# Patient Record
Sex: Female | Born: 1945
Health system: Southern US, Community
[De-identification: ages and names within clinical notes are randomized; demographics above are authoritative.]

## PROBLEM LIST (undated history)

## (undated) DIAGNOSIS — I10 Essential (primary) hypertension: Secondary | ICD-10-CM

## (undated) DIAGNOSIS — M4800 Spinal stenosis, site unspecified: Secondary | ICD-10-CM

## (undated) DIAGNOSIS — M419 Scoliosis, unspecified: Secondary | ICD-10-CM

## (undated) DIAGNOSIS — I639 Cerebral infarction, unspecified: Secondary | ICD-10-CM

## (undated) HISTORY — PX: EYE SURGERY: SHX253

## (undated) HISTORY — DX: Cerebral infarction, unspecified: I63.9

## (undated) HISTORY — PX: CYST EXCISION: SHX5701

---

## 1997-11-09 ENCOUNTER — Emergency Department (HOSPITAL_COMMUNITY): Admission: EM | Admit: 1997-11-09 | Discharge: 1997-11-09 | Payer: Self-pay | Admitting: Emergency Medicine

## 1998-08-25 ENCOUNTER — Ambulatory Visit (HOSPITAL_COMMUNITY): Admission: RE | Admit: 1998-08-25 | Discharge: 1998-08-25 | Payer: Self-pay | Admitting: Cardiology

## 2000-01-04 ENCOUNTER — Encounter: Admission: RE | Admit: 2000-01-04 | Discharge: 2000-02-21 | Payer: Self-pay | Admitting: Neurological Surgery

## 2000-05-07 ENCOUNTER — Ambulatory Visit (HOSPITAL_COMMUNITY): Admission: RE | Admit: 2000-05-07 | Discharge: 2000-05-07 | Payer: Self-pay | Admitting: Neurosurgery

## 2000-05-07 ENCOUNTER — Encounter: Payer: Self-pay | Admitting: Neurosurgery

## 2000-05-21 ENCOUNTER — Ambulatory Visit (HOSPITAL_COMMUNITY): Admission: RE | Admit: 2000-05-21 | Discharge: 2000-05-21 | Payer: Self-pay | Admitting: Neurosurgery

## 2000-05-21 ENCOUNTER — Encounter: Payer: Self-pay | Admitting: Neurosurgery

## 2000-06-04 ENCOUNTER — Encounter: Payer: Self-pay | Admitting: Neurosurgery

## 2000-06-04 ENCOUNTER — Ambulatory Visit (HOSPITAL_COMMUNITY): Admission: RE | Admit: 2000-06-04 | Discharge: 2000-06-04 | Payer: Self-pay | Admitting: Neurosurgery

## 2003-02-07 ENCOUNTER — Emergency Department (HOSPITAL_COMMUNITY): Admission: EM | Admit: 2003-02-07 | Discharge: 2003-02-07 | Payer: Self-pay | Admitting: Podiatry

## 2003-05-06 ENCOUNTER — Encounter: Admission: RE | Admit: 2003-05-06 | Discharge: 2003-05-06 | Payer: Self-pay | Admitting: Neurological Surgery

## 2003-05-30 ENCOUNTER — Encounter: Admission: RE | Admit: 2003-05-30 | Discharge: 2003-05-30 | Payer: Self-pay | Admitting: Neurological Surgery

## 2003-06-14 ENCOUNTER — Encounter: Admission: RE | Admit: 2003-06-14 | Discharge: 2003-06-14 | Payer: Self-pay | Admitting: Neurological Surgery

## 2004-01-11 ENCOUNTER — Emergency Department (HOSPITAL_COMMUNITY): Admission: EM | Admit: 2004-01-11 | Discharge: 2004-01-11 | Payer: Self-pay | Admitting: Emergency Medicine

## 2004-03-27 ENCOUNTER — Emergency Department (HOSPITAL_COMMUNITY): Admission: EM | Admit: 2004-03-27 | Discharge: 2004-03-27 | Payer: Self-pay | Admitting: Emergency Medicine

## 2004-03-30 ENCOUNTER — Emergency Department (HOSPITAL_COMMUNITY): Admission: EM | Admit: 2004-03-30 | Discharge: 2004-03-30 | Payer: Self-pay | Admitting: Emergency Medicine

## 2005-03-08 ENCOUNTER — Encounter: Payer: Self-pay | Admitting: Neurological Surgery

## 2005-03-10 ENCOUNTER — Encounter: Payer: Self-pay | Admitting: Neurological Surgery

## 2005-06-22 ENCOUNTER — Ambulatory Visit: Payer: Self-pay

## 2006-03-07 ENCOUNTER — Ambulatory Visit: Payer: Self-pay

## 2006-08-27 ENCOUNTER — Ambulatory Visit (HOSPITAL_COMMUNITY): Admission: RE | Admit: 2006-08-27 | Discharge: 2006-08-27 | Payer: Self-pay | Admitting: Neurological Surgery

## 2009-09-18 ENCOUNTER — Ambulatory Visit: Payer: Self-pay | Admitting: Emergency Medicine

## 2010-07-01 ENCOUNTER — Encounter: Payer: Self-pay | Admitting: Neurological Surgery

## 2011-11-08 ENCOUNTER — Other Ambulatory Visit: Payer: Self-pay | Admitting: Cardiology

## 2012-10-21 ENCOUNTER — Encounter (HOSPITAL_COMMUNITY): Payer: Self-pay | Admitting: Emergency Medicine

## 2012-10-21 ENCOUNTER — Emergency Department (HOSPITAL_COMMUNITY)
Admission: EM | Admit: 2012-10-21 | Discharge: 2012-10-21 | Disposition: A | Discharge status: Home / Self Care | Payer: 59 | Source: Home / Self Care | Attending: Family Medicine | Admitting: Family Medicine

## 2012-10-21 ENCOUNTER — Emergency Department (INDEPENDENT_AMBULATORY_CARE_PROVIDER_SITE_OTHER): Payer: 59

## 2012-10-21 DIAGNOSIS — J029 Acute pharyngitis, unspecified: Secondary | ICD-10-CM

## 2012-10-21 HISTORY — DX: Spinal stenosis, site unspecified: M48.00

## 2012-10-21 HISTORY — DX: Scoliosis, unspecified: M41.9

## 2012-10-21 HISTORY — DX: Essential (primary) hypertension: I10

## 2012-10-21 LAB — POCT RAPID STREP A: Streptococcus, Group A Screen (Direct): NEGATIVE

## 2012-10-21 MED ORDER — AMOXICILLIN 500 MG PO CAPS: 500.0000 mg | ORAL_CAPSULE | Freq: Three times a day (TID) | ORAL | Status: DC

## 2012-10-21 NOTE — ED Notes (Signed)
Pt called x1 1430 no answer

## 2012-10-21 NOTE — ED Notes (Signed)
Asked by front desk to assess pt due to fever. States fever has been 102 F x 2 days. No Tylenol. Temp today was 98.2. Patient stable at this time. States she has chills.

## 2012-10-21 NOTE — ED Provider Notes (Addendum)
History     CSN: [id number]  Arrival date & time 10/21/12  1300   First MD Initiated Contact with Patient 10/21/12 1537      Chief Complaint  Patient presents with  . Fever    (Consider location/radiation/quality/duration/timing/severity/associated sxs/prior treatment) Patient is a 67 y.o. female presenting with fever. The history is provided by the patient.  Fever Severity:  Mild Duration:  3 days Progression:  Waxing and waning Chronicity:  New Associated symptoms: cough and rhinorrhea   Associated symptoms: no diarrhea, no dysuria, no nausea, no sore throat and no vomiting   Risk factors: sick contacts   Risk factors comment:  Grdaughter with strep throat.   Past Medical History  Diagnosis Date  . Hypertension   . Scoliosis   . Spinal stenosis     Past Surgical History  Procedure Laterality Date  . Cesarean section    . Eye surgery      No family history on file.  History  Substance Use Topics  . Smoking status: Current Every Day Smoker  . Smokeless tobacco: Not on file  . Alcohol Use: No    OB History   Grav Para Term Preterm Abortions TAB SAB Ect Mult Living                  Review of Systems  Constitutional: Positive for fever.  HENT: Positive for rhinorrhea. Negative for sore throat.   Respiratory: Positive for cough.   Gastrointestinal: Negative.  Negative for nausea, vomiting and diarrhea.  Genitourinary: Negative.  Negative for dysuria.    Allergies  Review of patient's allergies indicates no known allergies.  Home Medications   Current Outpatient Rx  Name  Route  Sig  Dispense  Refill  . metoprolol succinate (TOPROL-XL) 50 MG 24 hr tablet   Oral   Take 50 mg by mouth daily. Take with or immediately following a meal.         . amoxicillin (AMOXIL) 500 MG capsule   Oral   Take 1 capsule (500 mg total) by mouth 3 (three) times daily.   30 capsule   0     BP 126/76  Pulse 98  Temp(Src) 99.6 F (37.6 C) (Oral)  Resp 16   SpO2 95%  Physical Exam  Nursing note and vitals reviewed. Constitutional: She is oriented to person, place, and time. She appears well-developed and well-nourished.  HENT:  Head: Normocephalic.  Right Ear: External ear normal.  Left Ear: External ear normal.  Mouth/Throat: Oropharynx is clear and moist.  Eyes: Pupils are equal, round, and reactive to light.  Neck: Normal range of motion. Neck supple.  Cardiovascular: Normal rate, regular rhythm, normal heart sounds and intact distal pulses.   Pulmonary/Chest: Effort normal and breath sounds normal.  Abdominal: Soft. Bowel sounds are normal. There is no tenderness.  Neurological: She is alert and oriented to person, place, and time.  Skin: Skin is warm and dry.    ED Course  Procedures (including critical care time)  Labs Reviewed  POCT RAPID STREP A ([facility] ONLY)   Dg Chest 2 View  10/21/2012   *RADIOLOGY REPORT*  Clinical Data: Fever and cough.  Smoker.  CHEST - 2 VIEW  Comparison: None  Findings:  The heart size appears normal.  No pleural effusion or edema.  No airspace consolidation identified.  There is a scoliosis deformity involving the thoracic spine.  This is convex towards the right.  IMPRESSION:  1.  No acute findings.  2.  Thoracic spondylosis noted.   Original Report Authenticated By: Signa Kell, M.D.     1. Acute pharyngitis       MDM  X-rays reviewed and report per radiologist.         Linna Hoff, MD 10/21/12 1643  Linna Hoff, MD 10/23/12 [phone]

## 2012-10-21 NOTE — ED Notes (Signed)
Onset 3 days ago of fever ( 102), cough, stuffy head and ears and nose.  Denies sore throat

## 2012-11-29 ENCOUNTER — Emergency Department (HOSPITAL_COMMUNITY)
Admission: EM | Admit: 2012-11-29 | Discharge: 2012-11-29 | Disposition: A | Discharge status: Home / Self Care | Payer: 59 | Attending: Emergency Medicine | Admitting: Emergency Medicine

## 2012-11-29 ENCOUNTER — Emergency Department (HOSPITAL_COMMUNITY): Payer: 59

## 2012-11-29 ENCOUNTER — Other Ambulatory Visit (HOSPITAL_COMMUNITY): Payer: 59

## 2012-11-29 ENCOUNTER — Encounter (HOSPITAL_COMMUNITY): Payer: Self-pay | Admitting: Emergency Medicine

## 2012-11-29 DIAGNOSIS — R0789 Other chest pain: Secondary | ICD-10-CM | POA: Insufficient documentation

## 2012-11-29 DIAGNOSIS — M79609 Pain in unspecified limb: Secondary | ICD-10-CM | POA: Insufficient documentation

## 2012-11-29 DIAGNOSIS — G8929 Other chronic pain: Secondary | ICD-10-CM | POA: Insufficient documentation

## 2012-11-29 DIAGNOSIS — Z79899 Other long term (current) drug therapy: Secondary | ICD-10-CM | POA: Insufficient documentation

## 2012-11-29 DIAGNOSIS — Z8739 Personal history of other diseases of the musculoskeletal system and connective tissue: Secondary | ICD-10-CM | POA: Insufficient documentation

## 2012-11-29 DIAGNOSIS — M549 Dorsalgia, unspecified: Secondary | ICD-10-CM | POA: Insufficient documentation

## 2012-11-29 DIAGNOSIS — K7689 Other specified diseases of liver: Secondary | ICD-10-CM | POA: Insufficient documentation

## 2012-11-29 DIAGNOSIS — N1330 Unspecified hydronephrosis: Secondary | ICD-10-CM | POA: Insufficient documentation

## 2012-11-29 DIAGNOSIS — I10 Essential (primary) hypertension: Secondary | ICD-10-CM | POA: Insufficient documentation

## 2012-11-29 DIAGNOSIS — Z7982 Long term (current) use of aspirin: Secondary | ICD-10-CM | POA: Insufficient documentation

## 2012-11-29 DIAGNOSIS — F17200 Nicotine dependence, unspecified, uncomplicated: Secondary | ICD-10-CM | POA: Insufficient documentation

## 2012-11-29 LAB — HEPATIC FUNCTION PANEL
ALT: 43 U/L — ABNORMAL HIGH (ref 0–35)
AST: 44 U/L — ABNORMAL HIGH (ref 0–37)
Albumin: 3.7 g/dL (ref 3.5–5.2)
Alkaline Phosphatase: 176 U/L — ABNORMAL HIGH (ref 39–117)
Bilirubin, Direct: 0.1 mg/dL (ref 0.0–0.3)
Indirect Bilirubin: 0.6 mg/dL (ref 0.3–0.9)
Total Bilirubin: 0.7 mg/dL (ref 0.3–1.2)
Total Protein: 7.1 g/dL (ref 6.0–8.3)

## 2012-11-29 LAB — BASIC METABOLIC PANEL
BUN: 14 mg/dL (ref 6–23)
CO2: 28 mEq/L (ref 19–32)
Calcium: 9.7 mg/dL (ref 8.4–10.5)
Chloride: 101 mEq/L (ref 96–112)
Creatinine, Ser: 0.86 mg/dL (ref 0.50–1.10)
GFR calc Af Amer: 80 mL/min — ABNORMAL LOW (ref >90)
GFR calc non Af Amer: 69 mL/min — ABNORMAL LOW (ref >90)
Glucose, Bld: 136 mg/dL — ABNORMAL HIGH (ref 70–99)
Potassium: 4.5 mEq/L (ref 3.5–5.1)
Sodium: 138 mEq/L (ref 135–145)

## 2012-11-29 LAB — CBC
HCT: 45.5 % (ref 36.0–46.0)
Hemoglobin: 14.6 g/dL (ref 12.0–15.0)
MCH: 29.7 pg (ref 26.0–34.0)
MCHC: 32.1 g/dL (ref 30.0–36.0)
MCV: 92.7 fL (ref 78.0–100.0)
Platelets: 254 10*3/uL (ref 150–400)
RBC: 4.91 MIL/uL (ref 3.87–5.11)
RDW: 14.4 % (ref 11.5–15.5)
WBC: 16.1 10*3/uL — ABNORMAL HIGH (ref 4.0–10.5)

## 2012-11-29 LAB — PRO B NATRIURETIC PEPTIDE: Pro B Natriuretic peptide (BNP): 141.2 pg/mL — ABNORMAL HIGH (ref 0–125)

## 2012-11-29 LAB — D-DIMER, QUANTITATIVE (NOT AT ARMC): D-Dimer, Quant: 0.97 ug/mL-FEU — ABNORMAL HIGH (ref 0.00–0.48)

## 2012-11-29 LAB — URINALYSIS, ROUTINE W REFLEX MICROSCOPIC
Bilirubin Urine: NEGATIVE
Glucose, UA: NEGATIVE mg/dL
Hgb urine dipstick: NEGATIVE
Ketones, ur: NEGATIVE mg/dL
Leukocytes, UA: NEGATIVE
Nitrite: NEGATIVE
Protein, ur: NEGATIVE mg/dL
Specific Gravity, Urine: 1.011 (ref 1.005–1.030)
Urobilinogen, UA: 0.2 mg/dL (ref 0.0–1.0)
pH: 6 (ref 5.0–8.0)

## 2012-11-29 LAB — POCT I-STAT TROPONIN I: Troponin i, poc: 0 ng/mL (ref 0.00–0.08)

## 2012-11-29 LAB — LIPASE, BLOOD: Lipase: 24 U/L (ref 11–59)

## 2012-11-29 LAB — ACETAMINOPHEN LEVEL: Acetaminophen (Tylenol), Serum: <15 ug/mL (ref 10–30)

## 2012-11-29 MED ORDER — IBUPROFEN 400 MG PO TABS: 400.0000 mg | ORAL_TABLET | Freq: Four times a day (QID) | ORAL | Status: DC | PRN

## 2012-11-29 MED ORDER — HYDROCODONE-ACETAMINOPHEN 5-325 MG PO TABS: 1.0000 | ORAL_TABLET | Freq: Four times a day (QID) | ORAL | Status: DC | PRN

## 2012-11-29 MED ORDER — ACETAMINOPHEN 325 MG PO TABS
650.0000 mg | ORAL_TABLET | Freq: Once | ORAL | Status: AC
  Administered 2012-11-29: 650 mg via ORAL
  Filled 2012-11-29: qty 2

## 2012-11-29 MED ORDER — IOHEXOL 350 MG/ML SOLN
80.0000 mL | Freq: Once | INTRAVENOUS | Status: AC | PRN
  Administered 2012-11-29: 80 mL via INTRAVENOUS

## 2012-11-29 MED ORDER — SODIUM CHLORIDE 0.9 % IV SOLN
Freq: Once | INTRAVENOUS | Status: AC
  Administered 2012-11-29: 14:00:00 via INTRAVENOUS

## 2012-11-29 MED ORDER — SODIUM CHLORIDE 0.9 % IV BOLUS (SEPSIS)
1000.0000 mL | Freq: Once | INTRAVENOUS | Status: AC
  Administered 2012-11-29: 1000 mL via INTRAVENOUS

## 2012-11-29 NOTE — ED Notes (Signed)
Pt still off the floor at CT.

## 2012-11-29 NOTE — ED Notes (Signed)
Pt ambulated to room a-3 with slow, steady gait. Complains of right sided rib cage pain that radiates to side of back on right side. Also reports right leg pain but states this has been "ongoing for years due to sciatica."

## 2012-11-29 NOTE — ED Notes (Signed)
Patient transported to CT 

## 2012-11-29 NOTE — ED Notes (Signed)
Pt c/o swelling to right rib area and pain. Pt reports pain radiates to right mid back and down to hip area. Pt took Tylenol #3 this morning about 5:30 am with some relief.

## 2012-11-29 NOTE — ED Provider Notes (Signed)
History     CSN: [id number]  Arrival date & time 11/29/12  0802   First MD Initiated Contact with Patient 11/29/12 [phone]      Chief Complaint  Patient presents with  . Chest Pain    x 1 week  . Leg Pain    x 1 week  . Back Pain    x 1week    (Consider location/radiation/quality/duration/timing/severity/associated sxs/prior treatment) HPI Comments: Pt comes in with cc of chest pain. Has hx of marfans, HTN, spinal stenosis with chronic back pain. Pt states that she has chronic back pain - and has some back pain now. She has some numbness, tingling in her legs, also not new. No new weakness, urinary incontinence, urinary retention, bowel incontinence, saddle anesthesia. Pt has however new right sided chest and epigastric pain. The pain has been constant, sharp. Present for 2 days and is located in the RUQ abd, lower thorax and flank region. No hx of kidney stones, liver dz, no new cough, no uti like sx, no gall bladder dz hx. Pt denies dib. The pain is worse with deep inspiration and also with palpation.     Patient is a 67 y.o. female presenting with chest pain, leg pain, and back pain. The history is provided by the patient.  Chest Pain Associated symptoms: back pain   Associated symptoms: no abdominal pain, no cough, no headache, no nausea, no shortness of breath and not vomiting   Leg Pain Associated symptoms: back pain   Associated symptoms: no neck pain   Back Pain Associated symptoms: chest pain and leg pain   Associated symptoms: no abdominal pain, no dysuria and no headaches     Past Medical History  Diagnosis Date  . Hypertension   . Scoliosis   . Spinal stenosis     Past Surgical History  Procedure Laterality Date  . Cesarean section    . Eye surgery      No family history on file.  History  Substance Use Topics  . Smoking status: Current Every Day Smoker  . Smokeless tobacco: Not on file  . Alcohol Use: No    OB History   Grav Para Term Preterm  Abortions TAB SAB Ect Mult Living                  Review of Systems  Constitutional: Positive for activity change.  HENT: Negative for facial swelling and neck pain.   Respiratory: Negative for cough, shortness of breath and wheezing.   Cardiovascular: Positive for chest pain.  Gastrointestinal: Negative for nausea, vomiting, abdominal pain, diarrhea, constipation, blood in stool and abdominal distention.  Genitourinary: Negative for dysuria, hematuria and difficulty urinating.  Musculoskeletal: Positive for back pain.  Skin: Negative for color change.  Neurological: Negative for speech difficulty and headaches.  Hematological: Does not bruise/bleed easily.  Psychiatric/Behavioral: Negative for confusion.    Allergies  Review of patient's allergies indicates no known allergies.  Home Medications   Current Outpatient Rx  Name  Route  Sig  Dispense  Refill  . acetaminophen (TYLENOL) 500 MG tablet   Oral   Take 1,000 mg by mouth every 6 (six) hours as needed for pain.         Marland Kitchen acetaminophen-codeine (TYLENOL #3) 300-30 MG per tablet   Oral   Take 1 tablet by mouth every 4 (four) hours as needed for pain.         Marland Kitchen aspirin 81 MG tablet   Oral  Take 81 mg by mouth daily.         Marland Kitchen gabapentin (NEURONTIN) 300 MG capsule   Oral   Take 300 mg by mouth daily.         . metoprolol succinate (TOPROL-XL) 50 MG 24 hr tablet   Oral   Take 50 mg by mouth daily. Take with or immediately following a meal.           BP 118/72  Pulse 93  Temp(Src) 98 F (36.7 C) (Oral)  Resp 20  SpO2 96%  Physical Exam  Nursing note and vitals reviewed. Constitutional: She is oriented to person, place, and time. She appears well-developed and well-nourished.  HENT:  Head: Normocephalic and atraumatic.  Eyes: EOM are normal. Pupils are equal, round, and reactive to light.  Neck: Neck supple. No JVD present.  Cardiovascular: Normal rate, regular rhythm and normal heart sounds.    No murmur heard. Pulmonary/Chest: Effort normal. No respiratory distress. She exhibits tenderness.  Right anterior rib tenderness.   Abdominal: Soft. She exhibits no distension. There is tenderness. There is no rebound and no guarding.  RUQ tenderness, hepatomegaly, + right sided CVA tenderness/  Musculoskeletal: She exhibits edema.  Trace edema, with LLE unilateral swelling  Neurological: She is alert and oriented to person, place, and time.  Skin: Skin is warm and dry.    ED Course  Procedures (including critical care time)  Labs Reviewed  CBC - Abnormal; Notable for the following:    WBC 16.1 (*)    All other components within normal limits  BASIC METABOLIC PANEL  HEPATIC FUNCTION PANEL  LIPASE, BLOOD  URINALYSIS, ROUTINE W REFLEX MICROSCOPIC  ACETAMINOPHEN LEVEL  D-DIMER, QUANTITATIVE  PRO B NATRIURETIC PEPTIDE  POCT I-STAT TROPONIN I   No results found.   No diagnosis found.    MDM   Date: 11/29/2012  Rate: 88  Rhythm: normal sinus rhythm  QRS Axis: right  Intervals: normal  ST/T Wave abnormalities: nonspecific ST/T changes  Conduction Disutrbances:right bundle branch block and left posterior fascicular block  Narrative Interpretation:   Old EKG Reviewed: none available  Differential diagnosis includes: Valvular disorder Myocarditis Pericarditis Pneumonia PE Musculoskeletal pain Pancreatitis Hepatobiliary pathology including cholecystitis Gastritis/PUD SBO ACS syndrome Aortic Dissection  Pt comes in with cc of right sided abd pain, flank pain. The back pain, leg cramps are not new. Pt's exam shows some hepatomegaly, and pleuritic and musculoskeletal chest pain of the ribs, no rash / zoster.  Hx of marfans, so dissection is on the ddx. Hepatomegaly - unsure etiology. Pt has chronic tylenol use.  PE is also on the ddx, especially with the ekg showing right sided strain patter. It is possible that patient has hepatic congestion from right sided  CHF. She has hx of MVP, so we will get CHF labs as well.   2:53 PM Imaging results are back. Large hepatic cyst - will get surgery to see her. Elevated WC - not sure what the cause is - ua, cxr, ct, [place] - no infections. Will discharge.  Derwood Kaplan, MD 11/29/12 1455

## 2012-12-02 ENCOUNTER — Encounter (HOSPITAL_COMMUNITY): Payer: Self-pay | Admitting: Family Medicine

## 2012-12-02 ENCOUNTER — Inpatient Hospital Stay (HOSPITAL_COMMUNITY)
Admission: EM | Admit: 2012-12-02 | Discharge: 2012-12-06 | DRG: 418 | Disposition: A | Discharge status: Home / Self Care | Payer: 59 | Attending: Family Medicine | Admitting: Family Medicine

## 2012-12-02 DIAGNOSIS — R160 Hepatomegaly, not elsewhere classified: Secondary | ICD-10-CM

## 2012-12-02 DIAGNOSIS — D72829 Elevated white blood cell count, unspecified: Secondary | ICD-10-CM | POA: Diagnosis present

## 2012-12-02 DIAGNOSIS — K819 Cholecystitis, unspecified: Secondary | ICD-10-CM | POA: Diagnosis present

## 2012-12-02 DIAGNOSIS — K7689 Other specified diseases of liver: Principal | ICD-10-CM | POA: Diagnosis present

## 2012-12-02 DIAGNOSIS — M4120 Other idiopathic scoliosis, site unspecified: Secondary | ICD-10-CM | POA: Diagnosis present

## 2012-12-02 DIAGNOSIS — R7309 Other abnormal glucose: Secondary | ICD-10-CM | POA: Diagnosis present

## 2012-12-02 DIAGNOSIS — R6510 Systemic inflammatory response syndrome (SIRS) of non-infectious origin without acute organ dysfunction: Secondary | ICD-10-CM

## 2012-12-02 DIAGNOSIS — I10 Essential (primary) hypertension: Secondary | ICD-10-CM | POA: Diagnosis present

## 2012-12-02 DIAGNOSIS — F17200 Nicotine dependence, unspecified, uncomplicated: Secondary | ICD-10-CM | POA: Diagnosis present

## 2012-12-02 DIAGNOSIS — R109 Unspecified abdominal pain: Secondary | ICD-10-CM | POA: Diagnosis present

## 2012-12-02 DIAGNOSIS — Q8740 Marfan's syndrome, unspecified: Secondary | ICD-10-CM

## 2012-12-02 DIAGNOSIS — G8929 Other chronic pain: Secondary | ICD-10-CM | POA: Diagnosis present

## 2012-12-02 LAB — CBC WITH DIFFERENTIAL/PLATELET
Basophils Absolute: 0 10*3/uL (ref 0.0–0.1)
Basophils Relative: 0 % (ref 0–1)
Eosinophils Absolute: 0.3 10*3/uL (ref 0.0–0.7)
Eosinophils Relative: 2 % (ref 0–5)
HCT: 45.5 % (ref 36.0–46.0)
Hemoglobin: 15.2 g/dL — ABNORMAL HIGH (ref 12.0–15.0)
Lymphocytes Relative: 21 % (ref 12–46)
Lymphs Abs: 3.1 10*3/uL (ref 0.7–4.0)
MCH: 30.1 pg (ref 26.0–34.0)
MCHC: 33.4 g/dL (ref 30.0–36.0)
MCV: 90.1 fL (ref 78.0–100.0)
Monocytes Absolute: 1 10*3/uL (ref 0.1–1.0)
Monocytes Relative: 7 % (ref 3–12)
Neutro Abs: 10.3 10*3/uL — ABNORMAL HIGH (ref 1.7–7.7)
Neutrophils Relative %: 70 % (ref 43–77)
Platelets: UNDETERMINED 10*3/uL (ref 150–400)
RBC: 5.05 MIL/uL (ref 3.87–5.11)
RDW: 14.4 % (ref 11.5–15.5)
WBC: 14.7 10*3/uL — ABNORMAL HIGH (ref 4.0–10.5)

## 2012-12-02 LAB — URINALYSIS, ROUTINE W REFLEX MICROSCOPIC
Glucose, UA: NEGATIVE mg/dL
Hgb urine dipstick: NEGATIVE
Ketones, ur: 15 mg/dL — AB
Leukocytes, UA: NEGATIVE
Nitrite: NEGATIVE
Protein, ur: 30 mg/dL — AB
Specific Gravity, Urine: 1.029 (ref 1.005–1.030)
Urobilinogen, UA: 1 mg/dL (ref 0.0–1.0)
pH: 5 (ref 5.0–8.0)

## 2012-12-02 LAB — COMPREHENSIVE METABOLIC PANEL
ALT: 27 U/L (ref 0–35)
AST: 23 U/L (ref 0–37)
Albumin: 3.6 g/dL (ref 3.5–5.2)
Alkaline Phosphatase: 169 U/L — ABNORMAL HIGH (ref 39–117)
BUN: 13 mg/dL (ref 6–23)
CO2: 23 mEq/L (ref 19–32)
Calcium: 10.1 mg/dL (ref 8.4–10.5)
Chloride: 99 mEq/L (ref 96–112)
Creatinine, Ser: 0.99 mg/dL (ref 0.50–1.10)
GFR calc Af Amer: 67 mL/min — ABNORMAL LOW (ref >90)
GFR calc non Af Amer: 58 mL/min — ABNORMAL LOW (ref >90)
Glucose, Bld: 132 mg/dL — ABNORMAL HIGH (ref 70–99)
Potassium: 4.4 mEq/L (ref 3.5–5.1)
Sodium: 138 mEq/L (ref 135–145)
Total Bilirubin: 0.5 mg/dL (ref 0.3–1.2)
Total Protein: 7.5 g/dL (ref 6.0–8.3)

## 2012-12-02 LAB — LIPASE, BLOOD: Lipase: 19 U/L (ref 11–59)

## 2012-12-02 LAB — URINE MICROSCOPIC-ADD ON

## 2012-12-02 LAB — POCT I-STAT TROPONIN I: Troponin i, poc: 0.01 ng/mL (ref 0.00–0.08)

## 2012-12-02 MED ORDER — ONDANSETRON 4 MG PO TBDP
4.0000 mg | ORAL_TABLET | Freq: Once | ORAL | Status: AC
  Administered 2012-12-02: 4 mg via ORAL
  Filled 2012-12-02: qty 1

## 2012-12-02 MED ORDER — MORPHINE SULFATE 2 MG/ML IJ SOLN
2.0000 mg | Freq: Once | INTRAMUSCULAR | Status: AC
  Administered 2012-12-02: 2 mg via INTRAVENOUS
  Filled 2012-12-02: qty 1

## 2012-12-02 MED ORDER — OXYCODONE-ACETAMINOPHEN 5-325 MG PO TABS
1.0000 | ORAL_TABLET | Freq: Once | ORAL | Status: AC
  Administered 2012-12-02: 1 via ORAL
  Filled 2012-12-02: qty 1

## 2012-12-02 MED ORDER — SODIUM CHLORIDE 0.9 % IV BOLUS (SEPSIS)
1000.0000 mL | Freq: Once | INTRAVENOUS | Status: AC
  Administered 2012-12-02: 1000 mL via INTRAVENOUS

## 2012-12-02 MED ORDER — ONDANSETRON HCL 4 MG/2ML IJ SOLN
4.0000 mg | Freq: Once | INTRAMUSCULAR | Status: AC
  Administered 2012-12-02: 4 mg via INTRAVENOUS
  Filled 2012-12-02: qty 2

## 2012-12-02 NOTE — ED Notes (Signed)
Pt aware of need for urine sample. Reports she is unable to go at this time.

## 2012-12-02 NOTE — ED Provider Notes (Signed)
History    CSN: [id number] Arrival date & time 12/02/12  1718  First MD Initiated Contact with Patient 12/02/12 2325     Chief Complaint  Patient presents with  . 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  She presents for the second time in the past 4 days with complaints of right-sided abdominal pain. Her pain began about 5 days ago. It has been persistently worsening. She denies fever, nausea, vomiting and diarrhea. Her pain is worse when she lays on her right side and feels least severe when she is lying in the left lateral decubitus position. The pain as aching, pressure like and 8/10 in severity. She denies any genitourinary symptoms.  The patient was evaluated for the same complaint on June 22 and this emergency department. That time she had CT of the abdomen and pelvis which showed a giant cystic liver lesion which encompassed nearly the entire right lobe of the liver and extended to the level of the iliac crest. Followup was arranged with Gen. surgery and the patient was discharged.  She has been taking hydrocodone which was prescribed to her upon discharge. However, she feels her pain is worsening. The patient has not been able to sleep at night due to the severity of her pain.  Past Medical History  Diagnosis Date  . Hypertension   . Scoliosis   . Spinal stenosis    Past Surgical History  Procedure Laterality Date  . Cesarean section    . Eye surgery     History reviewed. No pertinent family history. History  Substance Use Topics  . Smoking status: Current Every Day Smoker  . Smokeless tobacco: Not on file  . Alcohol Use: No   OB History   Grav Para Term Preterm Abortions TAB SAB Ect Mult Living                 Review of Systems Gen: no weight loss,  fevers, chills, night sweats Eyes: no discharge or drainage, no occular pain or visual changes Nose: no epistaxis or rhinorrhea Mouth: no dental pain, no sore throat Neck: no neck pain Lungs: no SOB, cough, wheezing CV: no chest pain, palpitations, dependent edema or orthopnea Abd: As per history of present illness, otherwise negative GU: no dysuria or gross hematuria MSK: no myalgias or arthralgias Neuro: no headache, no focal neurologic deficits Endocrine-negative Skin: no rash Psyche: negative.  Allergies  Other  Home Medications   Current Outpatient Rx  Name  Route  Sig  Dispense  Refill  . acetaminophen (TYLENOL) 500 MG tablet   Oral   Take 1,000 mg by mouth every 6 (six) hours as needed for pain.         Marland Kitchen aspirin 81 MG tablet   Oral   Take 81 mg by mouth daily.         Marland Kitchen gabapentin (NEURONTIN) 300 MG capsule   Oral   Take 300 mg by mouth at bedtime.          Marland Kitchen HYDROcodone-acetaminophen (NORCO/VICODIN) 5-325 MG per tablet   Oral   Take 1 tablet by mouth every 6 (six) hours as needed for pain.         Marland Kitchen ibuprofen (ADVIL,MOTRIN) 400 MG tablet   Oral   Take 400 mg by mouth every 6 (six) hours as needed for pain.         Marland Kitchen  metoprolol succinate (TOPROL-XL) 50 MG 24 hr tablet   Oral   Take 50 mg by mouth 2 (two) times daily. Take with or immediately following a meal.         . Multiple Vitamins-Minerals (MULTIVITAMIN PO)   Oral   Take 1 tablet by mouth daily.          BP 129/72  Pulse 92  Temp(Src) 98.4 F (36.9 C)  Resp 18  SpO2 94% Physical Exam Gen: well developed and well nourished appearing Head: NCAT Eyes: PERL, EOMI Nose: no epistaixis or rhinorrhea Mouth/throat: mucosa is moist and pink Neck: supple, no stridor Lungs: CTA B, no wheezing, rhonchi or rales CV: RRR, no murmur Abd:+ hepatomegaly with liver palpable to the LUQ and into the pelvis, there is ttp over the right flank region, BS present, abd is soft and nondistended.   Back: no ttp, no cva ttp Skin: patient appears to have scattered petechiae over the truck and extremities. However, when I pointed this finding out to her she said that she has always had these skin lesions and that they are unchanged. Neuro: CN ii-xii grossly intact, no focal deficits Psyche; normal affect,  calm and cooperative.   ED Course  Procedures (including critical care time) Labs Reviewed  CBC WITH DIFFERENTIAL - Abnormal; Notable for the following:    WBC 14.7 (*)    Hemoglobin 15.2 (*)    Neutro Abs 10.3 (*)    All other components within normal limits  COMPREHENSIVE METABOLIC PANEL - Abnormal; Notable for the following:    Glucose, Bld 132 (*)    Alkaline Phosphatase 169 (*)    GFR calc non Af Amer 58 (*)    GFR calc Af Amer 67 (*)    All other components within normal limits  URINALYSIS, ROUTINE W REFLEX MICROSCOPIC - Abnormal; Notable for the following:    Color, Urine STRAW (*)    Bilirubin Urine MODERATE (*)    Ketones, ur 15 (*)    Protein, ur 30 (*)    All other components within normal limits  URINE MICROSCOPIC-ADD ON - Abnormal; Notable for the following:    Casts HYALINE CASTS (*)    All other components within normal limits  LIPASE, BLOOD  POCT I-STAT TROPONIN I   CT scan from November 29 2012 reviewed  And results as noted in HPI.   MDM  0030 - Case discussed with Dr. Carolynne Edouard, [facility]. He has reviewed the patient's chart and states that this mass has been present and stable for 8 years. Thus, he does not believe that the patient is an appropriate surgical admission. He will arrange follow up with Dr. Donell Beers.   0100 - Case discussed with Dr. Chestine Spore of Radiology. The CT scan in 2008 was a CT myelogram and did not evaluate the liver. Per his report after reviewing the study, the patient's giant cystic liver lesion was not seen on 2008 CT.   0130 - Case discussed with Dr. Toniann Fail who will see and admit the patient. I have written temporary admission orders.    Brandt Loosen, MD 12/03/12 [phone]

## 2012-12-02 NOTE — ED Notes (Signed)
Per pt was seen here recently for same. RLQ pain radiating into RUQ and into her back . Was referred to a GI doctor and tols she needed to see a Careers adviser. sts appt on the 2nd. sts pain is too bad and nausea. sts pain meds not working.

## 2012-12-02 NOTE — ED Notes (Addendum)
Pt c/o lower back pain and RUQ pain since last Monday. Pt was here on Sunday for same issues. Pt was referred to [facility], but they told her it wasn't an appropriate reference, gave her a more appropriate dr. Rock Nephew has an appoint with sx on Wednesday but sts the pain is just so bad she can't wait until then.sts she is nauseous here and there, denies v/d. Pt in nad, skin warm and dry, resp e/u.

## 2012-12-03 ENCOUNTER — Encounter (HOSPITAL_COMMUNITY): Payer: Self-pay | Admitting: Internal Medicine

## 2012-12-03 DIAGNOSIS — R109 Unspecified abdominal pain: Secondary | ICD-10-CM

## 2012-12-03 DIAGNOSIS — K7689 Other specified diseases of liver: Secondary | ICD-10-CM

## 2012-12-03 DIAGNOSIS — Q8740 Marfan's syndrome, unspecified: Secondary | ICD-10-CM

## 2012-12-03 DIAGNOSIS — I10 Essential (primary) hypertension: Secondary | ICD-10-CM

## 2012-12-03 HISTORY — DX: Other specified diseases of liver: K76.89

## 2012-12-03 LAB — BASIC METABOLIC PANEL
BUN: 15 mg/dL (ref 6–23)
CO2: 26 mEq/L (ref 19–32)
Calcium: 9.4 mg/dL (ref 8.4–10.5)
Chloride: 105 mEq/L (ref 96–112)
Creatinine, Ser: 0.87 mg/dL (ref 0.50–1.10)
GFR calc Af Amer: 79 mL/min — ABNORMAL LOW (ref >90)
GFR calc non Af Amer: 68 mL/min — ABNORMAL LOW (ref >90)
Glucose, Bld: 101 mg/dL — ABNORMAL HIGH (ref 70–99)
Potassium: 4.4 mEq/L (ref 3.5–5.1)
Sodium: 139 mEq/L (ref 135–145)

## 2012-12-03 LAB — HEMOGLOBIN A1C
Hgb A1c MFr Bld: 5.8 % — ABNORMAL HIGH (ref <5.7)
Mean Plasma Glucose: 120 mg/dL — ABNORMAL HIGH (ref <117)

## 2012-12-03 LAB — CBC
HCT: 41.3 % (ref 36.0–46.0)
Hemoglobin: 13.2 g/dL (ref 12.0–15.0)
MCH: 29.5 pg (ref 26.0–34.0)
MCHC: 32 g/dL (ref 30.0–36.0)
MCV: 92.2 fL (ref 78.0–100.0)
Platelets: 266 10*3/uL (ref 150–400)
RBC: 4.48 MIL/uL (ref 3.87–5.11)
RDW: 14.5 % (ref 11.5–15.5)
WBC: 10.8 10*3/uL — ABNORMAL HIGH (ref 4.0–10.5)

## 2012-12-03 LAB — PROTIME-INR
INR: 1.02 (ref 0.00–1.49)
Prothrombin Time: 13.2 seconds (ref 11.6–15.2)

## 2012-12-03 LAB — APTT: aPTT: 35 seconds (ref 24–37)

## 2012-12-03 MED ORDER — ACETAMINOPHEN 650 MG RE SUPP: 650.0000 mg | Freq: Four times a day (QID) | RECTAL | Status: DC | PRN

## 2012-12-03 MED ORDER — ONDANSETRON HCL 4 MG/2ML IJ SOLN
4.0000 mg | Freq: Four times a day (QID) | INTRAMUSCULAR | Status: DC | PRN
  Administered 2012-12-03: 4 mg via INTRAVENOUS
  Filled 2012-12-03: qty 2

## 2012-12-03 MED ORDER — ACETAMINOPHEN 325 MG PO TABS
650.0000 mg | ORAL_TABLET | Freq: Four times a day (QID) | ORAL | Status: DC | PRN
  Administered 2012-12-05 – 2012-12-06 (×2): 650 mg via ORAL
  Filled 2012-12-03 (×2): qty 2

## 2012-12-03 MED ORDER — HYDROCODONE-ACETAMINOPHEN 5-325 MG PO TABS
1.0000 | ORAL_TABLET | Freq: Four times a day (QID) | ORAL | Status: DC | PRN
  Administered 2012-12-03 – 2012-12-05 (×6): 1 via ORAL
  Filled 2012-12-03 (×6): qty 1

## 2012-12-03 MED ORDER — GABAPENTIN 300 MG PO CAPS
300.0000 mg | ORAL_CAPSULE | Freq: Every day | ORAL | Status: DC
  Administered 2012-12-03 – 2012-12-05 (×3): 300 mg via ORAL
  Filled 2012-12-03 (×5): qty 1

## 2012-12-03 MED ORDER — SODIUM CHLORIDE 0.9 % IV SOLN
INTRAVENOUS | Status: DC
  Administered 2012-12-03: 06:00:00 via INTRAVENOUS

## 2012-12-03 MED ORDER — MORPHINE SULFATE 2 MG/ML IJ SOLN
2.0000 mg | Freq: Once | INTRAMUSCULAR | Status: AC
  Administered 2012-12-03: 2 mg via INTRAVENOUS
  Filled 2012-12-03: qty 1

## 2012-12-03 MED ORDER — ONDANSETRON HCL 4 MG PO TABS
4.0000 mg | ORAL_TABLET | Freq: Four times a day (QID) | ORAL | Status: DC | PRN
  Administered 2012-12-03 – 2012-12-04 (×4): 4 mg via ORAL
  Filled 2012-12-03 (×4): qty 1

## 2012-12-03 MED ORDER — DEXTROSE 5 % IV SOLN
1.0000 g | Freq: Once | INTRAVENOUS | Status: AC
  Administered 2012-12-03: 1 g via INTRAVENOUS
  Filled 2012-12-03: qty 10

## 2012-12-03 MED ORDER — METOPROLOL SUCCINATE ER 50 MG PO TB24
50.0000 mg | ORAL_TABLET | Freq: Two times a day (BID) | ORAL | Status: DC
  Administered 2012-12-03 – 2012-12-06 (×7): 50 mg via ORAL
  Filled 2012-12-03 (×10): qty 1

## 2012-12-03 MED ORDER — MORPHINE SULFATE 2 MG/ML IJ SOLN: 1.0000 mg | INTRAMUSCULAR | Status: DC | PRN

## 2012-12-03 NOTE — Consult Note (Signed)
Patient to be seen by Dr. Donell Beers at [facility].  Marta Lamas. Gae Bon, MD, FACS [phone] [id number] [facility]

## 2012-12-03 NOTE — Consult Note (Signed)
Reason for Consult: Abdominal pain, Large Hepatic cyst Referring Physician: Adeline C Viyuoh, MD  Leslie Dean is an 66 y.o. female.  HPI: 66 y.o. female with known history of Marfan's syndrome, hypertension has been experiencing right-sided abdominal pain for last week which has been gradually worsened. Patient came to the ER 3 days ago and had CT angiogram of the chest and abdomen pelvis which showed a large hepatic cyst and was referred to gastroenterologist. Patient's pain continued to worsen and presented to the ER again. At that time the patient was admitted for pain management and surgical consultation. Patient denies any vomiting but has some mild nausea.  We are asked to see the patient for further surgical evaluation.   Past Medical History  Diagnosis Date  . Hypertension   . Scoliosis   . Spinal stenosis     Past Surgical History  Procedure Laterality Date  . Cesarean section    . Eye surgery      Family History  Problem Relation Age of Onset  . Marfan syndrome Grandchild     Social History:  reports that she has been smoking.  She does not have any smokeless tobacco history on file. She reports that she does not drink alcohol or use illicit drugs.  Allergies:  Allergies  Allergen Reactions  . Other     Says narcotics cause nausea for her    Medications: I have reviewed the patient's current medications.  Results for orders placed during the hospital encounter of 12/02/12 (from the past 48 hour(s))  CBC WITH DIFFERENTIAL     Status: Abnormal   Collection Time    12/02/12  5:35 PM      Result Value Range   WBC 14.7 (*) 4.0 - 10.5 K/uL   Comment: Chaunta Bejarano COUNT CONFIRMED ON SMEAR   RBC 5.05  3.87 - 5.11 MIL/uL   Hemoglobin 15.2 (*) 12.0 - 15.0 g/dL   HCT 45.5  36.0 - 46.0 %   MCV 90.1  78.0 - 100.0 fL   MCH 30.1  26.0 - 34.0 pg   MCHC 33.4  30.0 - 36.0 g/dL   RDW 14.4  11.5 - 15.5 %   Platelets PLATELET CLUMPS NOTED ON SMEAR, UNABLE TO ESTIMATE  150 - 400 K/uL    Comment: PLATELET CLUMPING, SUGGEST RECOLLECTION OF SAMPLE IN CITRATE TUBE.   Neutrophils Relative % 70  43 - 77 %   Lymphocytes Relative 21  12 - 46 %   Monocytes Relative 7  3 - 12 %   Eosinophils Relative 2  0 - 5 %   Basophils Relative 0  0 - 1 %   Neutro Abs 10.3 (*) 1.7 - 7.7 K/uL   Lymphs Abs 3.1  0.7 - 4.0 K/uL   Monocytes Absolute 1.0  0.1 - 1.0 K/uL   Eosinophils Absolute 0.3  0.0 - 0.7 K/uL   Basophils Absolute 0.0  0.0 - 0.1 K/uL   Smear Review PLATELET CLUMPS NOTED ON SMEAR    COMPREHENSIVE METABOLIC PANEL     Status: Abnormal   Collection Time    12/02/12  5:35 PM      Result Value Range   Sodium 138  135 - 145 mEq/L   Potassium 4.4  3.5 - 5.1 mEq/L   Chloride 99  96 - 112 mEq/L   CO2 23  19 - 32 mEq/L   Glucose, Bld 132 (*) 70 - 99 mg/dL   BUN 13  6 - 23 mg/dL     Creatinine, Ser 0.99  0.50 - 1.10 mg/dL   Calcium 10.1  8.4 - 10.5 mg/dL   Total Protein 7.5  6.0 - 8.3 g/dL   Albumin 3.6  3.5 - 5.2 g/dL   AST 23  0 - 37 U/L   ALT 27  0 - 35 U/L   Alkaline Phosphatase 169 (*) 39 - 117 U/L   Total Bilirubin 0.5  0.3 - 1.2 mg/dL   GFR calc non Af Amer 58 (*) >90 mL/min   GFR calc Af Amer 67 (*) >90 mL/min   Comment:            The eGFR has been calculated     using the CKD EPI equation.     This calculation has not been     validated in all clinical     situations.     eGFR's persistently     <90 mL/min signify     possible Chronic Kidney Disease.  LIPASE, BLOOD     Status: None   Collection Time    12/02/12  5:35 PM      Result Value Range   Lipase 19  11 - 59 U/L  POCT I-STAT TROPONIN I     Status: None   Collection Time    12/02/12  5:50 PM      Result Value Range   Troponin i, poc 0.01  0.00 - 0.08 ng/mL   Comment 3            Comment: Due to the release kinetics of cTnI,     a negative result within the first hours     of the onset of symptoms does not rule out     myocardial infarction with certainty.     If myocardial infarction is still  suspected,     repeat the test at appropriate intervals.  URINALYSIS, ROUTINE W REFLEX MICROSCOPIC     Status: Abnormal   Collection Time    12/02/12 11:04 PM      Result Value Range   Color, Urine STRAW (*) YELLOW   APPearance CLEAR  CLEAR   Specific Gravity, Urine 1.029  1.005 - 1.030   pH 5.0  5.0 - 8.0   Glucose, UA NEGATIVE  NEGATIVE mg/dL   Hgb urine dipstick NEGATIVE  NEGATIVE   Bilirubin Urine MODERATE (*) NEGATIVE   Ketones, ur 15 (*) NEGATIVE mg/dL   Protein, ur 30 (*) NEGATIVE mg/dL   Urobilinogen, UA 1.0  0.0 - 1.0 mg/dL   Nitrite NEGATIVE  NEGATIVE   Leukocytes, UA NEGATIVE  NEGATIVE  URINE MICROSCOPIC-ADD ON     Status: Abnormal   Collection Time    12/02/12 11:04 PM      Result Value Range   Squamous Epithelial / LPF RARE  RARE   WBC, UA 0-2  <3 WBC/hpf   RBC / HPF 0-2  <3 RBC/hpf   Bacteria, UA RARE  RARE   Casts HYALINE CASTS (*) NEGATIVE  PROTIME-INR     Status: None   Collection Time    12/02/12 11:46 PM      Result Value Range   Prothrombin Time 13.2  11.6 - 15.2 seconds   INR 1.02  0.00 - 1.49  APTT     Status: None   Collection Time    12/02/12 11:46 PM      Result Value Range   aPTT 35  24 - 37 seconds  BASIC METABOLIC PANEL     Status: Abnormal     Collection Time    12/03/12  7:10 AM      Result Value Range   Sodium 139  135 - 145 mEq/L   Potassium 4.4  3.5 - 5.1 mEq/L   Chloride 105  96 - 112 mEq/L   CO2 26  19 - 32 mEq/L   Glucose, Bld 101 (*) 70 - 99 mg/dL   BUN 15  6 - 23 mg/dL   Creatinine, Ser 0.87  0.50 - 1.10 mg/dL   Calcium 9.4  8.4 - 10.5 mg/dL   GFR calc non Af Amer 68 (*) >90 mL/min   GFR calc Af Amer 79 (*) >90 mL/min   Comment:            The eGFR has been calculated     using the CKD EPI equation.     This calculation has not been     validated in all clinical     situations.     eGFR's persistently     <90 mL/min signify     possible Chronic Kidney Disease.  CBC     Status: Abnormal   Collection Time    12/03/12   7:10 AM      Result Value Range   WBC 10.8 (*) 4.0 - 10.5 K/uL   RBC 4.48  3.87 - 5.11 MIL/uL   Hemoglobin 13.2  12.0 - 15.0 g/dL   HCT 41.3  36.0 - 46.0 %   MCV 92.2  78.0 - 100.0 fL   MCH 29.5  26.0 - 34.0 pg   MCHC 32.0  30.0 - 36.0 g/dL   RDW 14.5  11.5 - 15.5 %   Platelets 266  150 - 400 K/uL    No results found.  Review of Systems  Gastrointestinal: Positive for nausea and abdominal pain. Negative for vomiting, diarrhea, constipation and blood in stool.  All other systems reviewed and are negative.   Blood pressure 114/67, pulse 88, temperature 97.8 F (36.6 C), temperature source Oral, resp. rate 18, height 5' 7" (1.702 m), weight 208 lb 12.4 oz (94.7 kg), SpO2 96.00%. Physical Exam  Constitutional: She is oriented to person, place, and time. She appears well-developed and well-nourished. No distress.  HENT:  Head: Normocephalic and atraumatic.  Eyes: Conjunctivae are normal. Pupils are equal, round, and reactive to light.  Neck: Normal range of motion. Neck supple.  Cardiovascular: Normal rate and regular rhythm.   Respiratory: Effort normal and breath sounds normal.  GI: Bowel sounds are normal. She exhibits mass. There is tenderness.  Tender esp along right flank and around to back, large palp mass along the right upper abdominal, tender, +bs  Genitourinary:  deferred  Musculoskeletal: Normal range of motion. She exhibits no edema.  Neurological: She is alert and oriented to person, place, and time.  Skin: Skin is warm and dry.  Psychiatric: She has a normal mood and affect. Her behavior is normal. Thought content normal.    Assessment/Plan: Abdominal pain, Large hepatic cyst: our surgeon who specializes in the type of procedures that would most likely be appropriate for the patient is Dr. Byerly who is currently at [facility].  The patient will be transferred there for further evaluation and possible surgical intervention.  Dr. Byerly will see the patient  after she arrives at [facility], the attending team here has been notified and will arrange transfer.  Leslie Dean 12/03/2012, 12:22 PM      

## 2012-12-03 NOTE — Progress Notes (Signed)
Received report from RN at [facility], Pt arrived this unit via care link. She is alert and oriented, able to verbalize needs. VSS, Will continue to monitor.

## 2012-12-03 NOTE — Progress Notes (Signed)
[facility] PROGRESS NOTE  Leslie Dean ZOX:[id number] DOB: [birth year] DOA: 12/02/2012 PCP: No PCP Per Patient I have seen and examined pt who is a  66yo admitted this am by Dr  Toniann Fail with h/o Marfan's syndrome, HTN presenting with worsening R. Sided abd pain x1 and found to have Multiple hepatic cysts identified in the upper liver including a giant right lobe cyst measuring at least 21 cm in diameter per  CT angio of chest and CT abd with Extremely large cyst extending from the right hepatic lobe and occupying the near entirety of the right abdomen and no renal obstruction. Pt still with c/o pain this am,but ok control with meds, she denies any N/V, reviewed all studies with pt, noting wbc is now down to 10.8 on no abx.Will continue current management plan as per Dr Toniann Fail and follow. I Consulted Dr Donell Beers this am and PA states she is at [facility] this week and requests pt be  transferred to [facility] ASAP so that she can evaluate and possibly schedule for surgery as appropriate. I have discussed this plan with pt and she voices understanding and is agreeable. She will be transferred to [facility] 2.        Kela Millin  [facility] Pager [phone]. If 7PM-7AM, please contact night-coverage at [email or web address], password [facility] 12/03/2012, 8:42 AM  LOS: 1 day

## 2012-12-03 NOTE — Progress Notes (Signed)
Pt transferred to unit via stretcher from ED. Pt is alert and oriented x 4. Capable of voicing needs. Oriented to staff, room and call bell. Bed in lowest position. Safety contract reviewed. Dondra Spry

## 2012-12-03 NOTE — H&P (Signed)
[facility] History and Physical  Leslie Dean ZOX:[id number] DOB: [birth year] DOA: 12/02/2012  Referring physician: ER physician. PCP: No PCP Per Patient   Chief Complaint: Abdominal pain.  HPI: Leslie Dean is a 67 y.o. female with known history of Marfan's syndrome, hypertension has been experiencing right-sided abdominal pain for last one week which has been gradually worsening. Patient did complete ER 2 days ago and had CT angiogram of the chest and abdomen pelvis which showed a large hepatic cyst and was referred to gastroenterologist. Patient's pain continued to worsen and presented to the ER again. At this time patient has been admitted for pain management and surgical consultation with Dr. Donell Beers in a.m. Patient denies any vomiting but has some mild nausea denies any diarrhea fever chills chest pain or shortness of breath. ER physician did discuss with on-call surgeon Dr. Carolynne Edouard who had advised referral to Dr. Donell Beers surgeon.  Review of Systems: As presented in the history of presenting illness, rest negative.  Past Medical History  Diagnosis Date  . Hypertension   . Scoliosis   . Spinal stenosis    Past Surgical History  Procedure Laterality Date  . Cesarean section    . Eye surgery     Social History:  reports that she has been smoking.  She does not have any smokeless tobacco history on file. She reports that she does not drink alcohol or use illicit drugs. Home. where does patient live-- Can do ADLs. Can patient participate in ADLs?  Allergies  Allergen Reactions  . Other     Says narcotics cause nausea for her    Family History  Problem Relation Age of Onset  . Marfan syndrome Grandchild       Prior to Admission medications   Medication Sig Start Date End Date Taking? Authorizing Provider  acetaminophen (TYLENOL) 500 MG tablet Take 1,000 mg by mouth every 6 (six) hours as needed for pain.   Yes Historical Provider, MD  aspirin 81 MG tablet Take 81 mg by  mouth daily.   Yes Historical Provider, MD  gabapentin (NEURONTIN) 300 MG capsule Take 300 mg by mouth at bedtime.    Yes Historical Provider, MD  HYDROcodone-acetaminophen (NORCO/VICODIN) 5-325 MG per tablet Take 1 tablet by mouth every 6 (six) hours as needed for pain.   Yes Historical Provider, MD  ibuprofen (ADVIL,MOTRIN) 400 MG tablet Take 400 mg by mouth every 6 (six) hours as needed for pain.   Yes Historical Provider, MD  metoprolol succinate (TOPROL-XL) 50 MG 24 hr tablet Take 50 mg by mouth 2 (two) times daily. Take with or immediately following a meal.   Yes Historical Provider, MD  Multiple Vitamins-Minerals (MULTIVITAMIN PO) Take 1 tablet by mouth daily.   Yes Historical Provider, MD   Physical Exam: Filed Vitals:   12/03/12 0015 12/03/12 0030 12/03/12 0100 12/03/12 0145  BP: 114/71 128/73 142/71 117/101  Pulse:  83 82 58  Temp:      Resp: 20 17 20 20   SpO2: 91% 89% 94% 98%     General:  Well-developed and nourished.  Eyes: Anicteric no pallor.  ENT: No discharge from ears eyes nose mouth.  Neck: No mass felt.  Cardiovascular: S1-S2 heard.  Respiratory: No rhonchi or crepitations.  Abdomen: Soft nontender bowel sounds present.  Skin: No rash.  Musculoskeletal: No edema.  Psychiatric: Appears normal.  Neurologic: Alert awake oriented to time place and person. Moves all extremities.  Labs on Admission:  Basic Metabolic Panel:  Recent Labs  Lab 11/29/12 0845 12/02/12 1735  NA 138 138  K 4.5 4.4  CL 101 99  CO2 28 23  GLUCOSE 136* 132*  BUN 14 13  CREATININE 0.86 0.99  CALCIUM 9.7 10.1   Liver Function Tests:  Recent Labs Lab 11/29/12 0930 12/02/12 1735  AST 44* 23  ALT 43* 27  ALKPHOS 176* 169*  BILITOT 0.7 0.5  PROT 7.1 7.5  ALBUMIN 3.7 3.6    Recent Labs Lab 11/29/12 0930 12/02/12 1735  LIPASE 24 19   No results found for this basename: AMMONIA,  in the last 168 hours CBC:  Recent Labs Lab 11/29/12 0845 12/02/12 1735  WBC  16.1* 14.7*  NEUTROABS  --  10.3*  HGB 14.6 15.2*  HCT 45.5 45.5  MCV 92.7 90.1  PLT 254 PLATELET CLUMPS NOTED ON SMEAR, UNABLE TO ESTIMATE   Cardiac Enzymes: No results found for this basename: CKTOTAL, CKMB, CKMBINDEX, TROPONINI,  in the last 168 hours  BNP (last 3 results)  Recent Labs  11/29/12 0904  PROBNP 141.2*   CBG: No results found for this basename: GLUCAP,  in the last 168 hours  Radiological Exams on Admission: No results found.   Assessment/Plan Principal Problem:   Abdominal pain Active Problems:   Hepatic cyst   HTN (hypertension)   Marfans syndrome   1. Abdominal pain with large hepatic cyst - patient has been placed on pain relief medications and clear liquid diet. Consult Dr. Donell Beers in a.m. for further recommendations. 2. Hypertension - continue present medications. 3. Mild leukocytosis - probably reactionary. Patient afebrile. Follow CBC. 4. Mild hyperglycemia - check hemoglobin A1c. 5. History of Marfan syndrome.    Code Status: Full code.  Family Communication: None.  Disposition Plan: Admit to inpatient.    KAKRAKANDY,ARSHAD N. [facility] Pager [phone].  If 7PM-7AM, please contact night-coverage [email or web address] Password [email or web address] 12/03/2012, 2:22 AM

## 2012-12-04 ENCOUNTER — Encounter (HOSPITAL_COMMUNITY): Payer: Self-pay | Admitting: General Surgery

## 2012-12-04 ENCOUNTER — Encounter (HOSPITAL_COMMUNITY): Payer: Self-pay | Admitting: *Deleted

## 2012-12-04 ENCOUNTER — Inpatient Hospital Stay (HOSPITAL_COMMUNITY): Payer: 59 | Admitting: *Deleted

## 2012-12-04 ENCOUNTER — Encounter (HOSPITAL_COMMUNITY)
Admission: EM | Disposition: A | Discharge status: Home / Self Care | Payer: Self-pay | Source: Home / Self Care | Attending: Family Medicine

## 2012-12-04 ENCOUNTER — Inpatient Hospital Stay (HOSPITAL_COMMUNITY): Payer: 59

## 2012-12-04 DIAGNOSIS — Q8740 Marfan's syndrome, unspecified: Secondary | ICD-10-CM

## 2012-12-04 DIAGNOSIS — K811 Chronic cholecystitis: Secondary | ICD-10-CM

## 2012-12-04 DIAGNOSIS — K769 Liver disease, unspecified: Secondary | ICD-10-CM

## 2012-12-04 HISTORY — PX: CHOLECYSTECTOMY: SHX55

## 2012-12-04 LAB — CBC
HCT: 42.9 % (ref 36.0–46.0)
Hemoglobin: 13.4 g/dL (ref 12.0–15.0)
MCH: 28.9 pg (ref 26.0–34.0)
MCHC: 31.2 g/dL (ref 30.0–36.0)
MCV: 92.7 fL (ref 78.0–100.0)
Platelets: 262 10*3/uL (ref 150–400)
RBC: 4.63 MIL/uL (ref 3.87–5.11)
RDW: 14.3 % (ref 11.5–15.5)
WBC: 9.1 10*3/uL (ref 4.0–10.5)

## 2012-12-04 LAB — ABO/RH: ABO/RH(D): O POS

## 2012-12-04 LAB — TYPE AND SCREEN
ABO/RH(D): O POS
Antibody Screen: NEGATIVE

## 2012-12-04 LAB — CREATININE, SERUM
Creatinine, Ser: 0.86 mg/dL (ref 0.50–1.10)
GFR calc Af Amer: 80 mL/min — ABNORMAL LOW (ref >90)
GFR calc non Af Amer: 69 mL/min — ABNORMAL LOW (ref >90)

## 2012-12-04 LAB — MRSA PCR SCREENING: MRSA by PCR: NEGATIVE

## 2012-12-04 SURGERY — LAPAROSCOPIC CHOLECYSTECTOMY
Anesthesia: General | Site: Abdomen | Wound class: Contaminated

## 2012-12-04 MED ORDER — HEPARIN SODIUM (PORCINE) 5000 UNIT/ML IJ SOLN
5000.0000 [IU] | Freq: Three times a day (TID) | INTRAMUSCULAR | Status: DC
  Administered 2012-12-05 – 2012-12-06 (×3): 5000 [IU] via SUBCUTANEOUS
  Filled 2012-12-04 (×6): qty 1

## 2012-12-04 MED ORDER — FENTANYL CITRATE 0.05 MG/ML IJ SOLN
25.0000 ug | INTRAMUSCULAR | Status: DC | PRN
  Administered 2012-12-04: 25 ug via INTRAVENOUS
  Administered 2012-12-04: 50 ug via INTRAVENOUS

## 2012-12-04 MED ORDER — MIDAZOLAM HCL 5 MG/5ML IJ SOLN
INTRAMUSCULAR | Status: DC | PRN
  Administered 2012-12-04: 2 mg via INTRAVENOUS

## 2012-12-04 MED ORDER — BUPIVACAINE-EPINEPHRINE (PF) 0.25% -1:200000 IJ SOLN
INTRAMUSCULAR | Status: DC | PRN
  Administered 2012-12-04: 10 mL

## 2012-12-04 MED ORDER — LACTATED RINGERS IV SOLN
INTRAVENOUS | Status: DC | PRN
  Administered 2012-12-04 (×3): via INTRAVENOUS

## 2012-12-04 MED ORDER — DEXTROSE 5 % IV SOLN
2.0000 g | INTRAVENOUS | Status: AC
  Administered 2012-12-04: 2 g via INTRAVENOUS

## 2012-12-04 MED ORDER — LACTATED RINGERS IV SOLN: INTRAVENOUS | Status: DC

## 2012-12-04 MED ORDER — MEPERIDINE HCL 50 MG/ML IJ SOLN: 6.2500 mg | INTRAMUSCULAR | Status: DC | PRN

## 2012-12-04 MED ORDER — KCL IN DEXTROSE-NACL 20-5-0.45 MEQ/L-%-% IV SOLN
INTRAVENOUS | Status: DC
  Administered 2012-12-04 – 2012-12-05 (×2): via INTRAVENOUS
  Administered 2012-12-06: 1000 mL via INTRAVENOUS
  Filled 2012-12-04 (×6): qty 1000

## 2012-12-04 MED ORDER — FENTANYL CITRATE 0.05 MG/ML IJ SOLN
INTRAMUSCULAR | Status: DC | PRN
  Administered 2012-12-04: 50 ug via INTRAVENOUS
  Administered 2012-12-04: 100 ug via INTRAVENOUS
  Administered 2012-12-04 (×2): 50 ug via INTRAVENOUS

## 2012-12-04 MED ORDER — SUCCINYLCHOLINE CHLORIDE 20 MG/ML IJ SOLN
INTRAMUSCULAR | Status: DC | PRN
  Administered 2012-12-04: 100 mg via INTRAVENOUS

## 2012-12-04 MED ORDER — LIDOCAINE HCL 1 % IJ SOLN
INTRAMUSCULAR | Status: DC | PRN
  Administered 2012-12-04: 10 mL

## 2012-12-04 MED ORDER — PROPOFOL 10 MG/ML IV BOLUS
INTRAVENOUS | Status: DC | PRN
  Administered 2012-12-04: 180 mg via INTRAVENOUS

## 2012-12-04 MED ORDER — GLYCOPYRROLATE 0.2 MG/ML IJ SOLN
INTRAMUSCULAR | Status: DC | PRN
  Administered 2012-12-04: 0.6 mg via INTRAVENOUS

## 2012-12-04 MED ORDER — LACTATED RINGERS IV SOLN
INTRAVENOUS | Status: DC | PRN
  Administered 2012-12-04: 1000 mL

## 2012-12-04 MED ORDER — PHENYLEPHRINE HCL 10 MG/ML IJ SOLN
INTRAMUSCULAR | Status: DC | PRN
  Administered 2012-12-04: 40 ug via INTRAVENOUS

## 2012-12-04 MED ORDER — NEOSTIGMINE METHYLSULFATE 1 MG/ML IJ SOLN
INTRAMUSCULAR | Status: DC | PRN
  Administered 2012-12-04: 5 mg via INTRAVENOUS

## 2012-12-04 MED ORDER — LACTATED RINGERS IV SOLN
INTRAVENOUS | Status: DC | PRN
  Administered 2012-12-04: 12:00:00 via INTRAVENOUS

## 2012-12-04 MED ORDER — ROCURONIUM BROMIDE 100 MG/10ML IV SOLN
INTRAVENOUS | Status: DC | PRN
  Administered 2012-12-04: 5 mg via INTRAVENOUS
  Administered 2012-12-04: 25 mg via INTRAVENOUS
  Administered 2012-12-04: 10 mg via INTRAVENOUS

## 2012-12-04 MED ORDER — MORPHINE SULFATE 2 MG/ML IJ SOLN
1.0000 mg | INTRAMUSCULAR | Status: DC | PRN
  Administered 2012-12-04 – 2012-12-06 (×5): 2 mg via INTRAVENOUS
  Filled 2012-12-04 (×5): qty 1

## 2012-12-04 MED ORDER — ONDANSETRON HCL 4 MG/2ML IJ SOLN
INTRAMUSCULAR | Status: DC | PRN
  Administered 2012-12-04: 4 mg via INTRAVENOUS

## 2012-12-04 MED ORDER — PROMETHAZINE HCL 25 MG/ML IJ SOLN
6.2500 mg | INTRAMUSCULAR | Status: DC | PRN
  Administered 2012-12-04: 6.25 mg via INTRAVENOUS

## 2012-12-04 MED ORDER — LIDOCAINE HCL (CARDIAC) 20 MG/ML IV SOLN
INTRAVENOUS | Status: DC | PRN
  Administered 2012-12-04: 50 mg via INTRAVENOUS

## 2012-12-04 MED ORDER — POTASSIUM CHLORIDE 2 MEQ/ML IV SOLN
INTRAVENOUS | Status: DC
  Administered 2012-12-04: 11:00:00 via INTRAVENOUS
  Filled 2012-12-04: qty 1000

## 2012-12-04 SURGICAL SUPPLY — 48 items
ADH SKN CLS APL DERMABOND .7 (GAUZE/BANDAGES/DRESSINGS) ×1
APL SKNCLS STERI-STRIP NONHPOA (GAUZE/BANDAGES/DRESSINGS)
APPLIER CLIP ROT 10 11.4 M/L (STAPLE) ×2
APR CLP MED LRG 11.4X10 (STAPLE) ×1
BAG SPEC RTRVL LRG 6X4 10 (ENDOMECHANICALS) ×1
BENZOIN TINCTURE PRP APPL 2/3 (GAUZE/BANDAGES/DRESSINGS) IMPLANT
CANISTER SUCTION 2500CC (MISCELLANEOUS) ×2 IMPLANT
CHLORAPREP W/TINT 26ML (MISCELLANEOUS) ×2 IMPLANT
CLIP APPLIE ROT 10 11.4 M/L (STAPLE) IMPLANT
CLOTH BEACON ORANGE TIMEOUT ST (SAFETY) ×2 IMPLANT
COVER MAYO STAND STRL (DRAPES) IMPLANT
DECANTER SPIKE VIAL GLASS SM (MISCELLANEOUS) ×2 IMPLANT
DERMABOND ADVANCED (GAUZE/BANDAGES/DRESSINGS) ×1
DERMABOND ADVANCED .7 DNX12 (GAUZE/BANDAGES/DRESSINGS) IMPLANT
DRAPE C-ARM 42X120 X-RAY (DRAPES) IMPLANT
DRAPE LAPAROSCOPIC ABDOMINAL (DRAPES) ×2 IMPLANT
DRAPE UTILITY XL STRL (DRAPES) ×2 IMPLANT
DRAPE WARM FLUID 44X44 (DRAPE) ×2 IMPLANT
ELECT REM PT RETURN 9FT ADLT (ELECTROSURGICAL) ×2
ELECTRODE REM PT RTRN 9FT ADLT (ELECTROSURGICAL) ×1 IMPLANT
GLOVE BIO SURGEON STRL SZ 6 (GLOVE) ×2 IMPLANT
GLOVE BIO SURGEON STRL SZ7 (GLOVE) ×2 IMPLANT
GLOVE BIOGEL PI IND STRL 7.5 (GLOVE) ×1 IMPLANT
GLOVE BIOGEL PI INDICATOR 7.5 (GLOVE) ×1
GLOVE ECLIPSE 7.0 STRL STRAW (GLOVE) ×1 IMPLANT
GLOVE INDICATOR 6.5 STRL GRN (GLOVE) ×2 IMPLANT
GLOVE INDICATOR 8.0 STRL GRN (GLOVE) ×1 IMPLANT
GOWN PREVENTION PLUS XLARGE (GOWN DISPOSABLE) ×2 IMPLANT
GOWN STRL NON-REIN LRG LVL3 (GOWN DISPOSABLE) ×3 IMPLANT
GOWN STRL REIN 2XL LVL4 (GOWN DISPOSABLE) ×2 IMPLANT
GOWN STRL REIN XL XLG (GOWN DISPOSABLE) ×2 IMPLANT
KIT BASIN OR (CUSTOM PROCEDURE TRAY) ×2 IMPLANT
NS IRRIG 1000ML POUR BTL (IV SOLUTION) ×2 IMPLANT
POUCH SPECIMEN RETRIEVAL 10MM (ENDOMECHANICALS) ×2 IMPLANT
SET CHOLANGIOGRAPH MIX (MISCELLANEOUS) IMPLANT
SET IRRIG TUBING LAPAROSCOPIC (IRRIGATION / IRRIGATOR) ×2 IMPLANT
SOLUTION ANTI FOG 6CC (MISCELLANEOUS) ×2 IMPLANT
STRIP CLOSURE SKIN 1/2X4 (GAUZE/BANDAGES/DRESSINGS) IMPLANT
SUT MNCRL AB 4-0 PS2 18 (SUTURE) ×2 IMPLANT
TOWEL OR 17X26 10 PK STRL BLUE (TOWEL DISPOSABLE) ×2 IMPLANT
TOWEL OR NON WOVEN STRL DISP B (DISPOSABLE) ×2 IMPLANT
TRAP SPECIMEN MUCOUS 40CC (MISCELLANEOUS) ×1 IMPLANT
TRAY LAP CHOLE (CUSTOM PROCEDURE TRAY) ×2 IMPLANT
TROCAR BLADELESS OPT 5 75 (ENDOMECHANICALS) ×2 IMPLANT
TROCAR SLEEVE XCEL 5X75 (ENDOMECHANICALS) ×3 IMPLANT
TROCAR XCEL BLUNT TIP 100MML (ENDOMECHANICALS) ×2 IMPLANT
TROCAR XCEL NON-BLD 11X100MML (ENDOMECHANICALS) IMPLANT
TUBING INSUFFLATION 10FT LAP (TUBING) ×2 IMPLANT

## 2012-12-04 NOTE — Progress Notes (Signed)
[facility] PROGRESS NOTE  [name] NUU:[id number] DOB: 04-01-1946 DOA: 12/02/2012 PCP: No PCP Per Patient  Assessment/Plan: 1. Abdominal pain - At this point suspect related to hepatic cysts. - Patient is going for operation today 6/27 for lap liver cyst unroofing - Continue with pain control  2. Liver cyst - unroofing to be done as mentioned above - Patient to have fluid sent for cytology  3. HTN - Patient is on metoprolol (toprol-xl) 50 mg po BID - Blood pressure is relatively well controlled  4. Marfan syndrome - patient to follow up with her pcp for continued evaluation and recommendations.   Code Status: full Family Communication: no family at bedside.  Disposition Plan: Pending evaluation and recommendations from surgeons and pain control.  Consultants:  General surgery: Dr. Donell Beers   Procedures:  As mentioned above.  Antibiotics:  On cefoxitin  HPI/Subjective: Pt states her abdominal discomfort is well controlled. No new complaints.   Objective: Filed Vitals:   12/03/12 1813 12/03/12 1847 12/03/12 2100 12/04/12 0500  BP: 126/88 129/85 115/74 132/75  Pulse: 81 93 94 69  Temp: 98.8 F (37.1 C) 98 F (36.7 C) 97.7 F (36.5 C) 98.1 F (36.7 C)  TempSrc: Oral Oral Oral Oral  Resp: 18 18 20 20   Height:  5\' 7"  (1.702 m)    Weight:  95.5 kg (210 lb 8.6 oz)    SpO2: 100% 91% 93% 95%    Intake/Output Summary (Last 24 hours) at 12/04/12 1157 Last data filed at 12/03/12 1700  Gross per 24 hour  Intake    600 ml  Output      2 ml  Net    598 ml   Filed Weights   12/03/12 0556 12/03/12 1847  Weight: 94.7 kg (208 lb 12.4 oz) 95.5 kg (210 lb 8.6 oz)    Exam:   General:  Pt in NAD, Alert and awake  Cardiovascular: RRR, no MRG  Respiratory: CTA BL, no wheezes  Abdomen: soft, NT, ND  Musculoskeletal: no cyanosis or clubbing   Data Reviewed: Basic Metabolic Panel:  Recent Labs Lab 11/29/12 0845 12/02/12 1735 12/03/12 0710  NA  138 138 139  K 4.5 4.4 4.4  CL 101 99 105  CO2 28 23 26   GLUCOSE 136* 132* 101*  BUN 14 13 15   CREATININE 0.86 0.99 0.87  CALCIUM 9.7 10.1 9.4   Liver Function Tests:  Recent Labs Lab 11/29/12 0930 12/02/12 1735  AST 44* 23  ALT 43* 27  ALKPHOS 176* 169*  BILITOT 0.7 0.5  PROT 7.1 7.5  ALBUMIN 3.7 3.6    Recent Labs Lab 11/29/12 0930 12/02/12 1735  LIPASE 24 19   No results found for this basename: AMMONIA,  in the last 168 hours CBC:  Recent Labs Lab 11/29/12 0845 12/02/12 1735 12/03/12 0710 12/04/12 0530  WBC 16.1* 14.7* 10.8* 9.1  NEUTROABS  --  10.3*  --   --   HGB 14.6 15.2* 13.2 13.4  HCT 45.5 45.5 41.3 42.9  MCV 92.7 90.1 92.2 92.7  PLT 254 PLATELET CLUMPS NOTED ON SMEAR, UNABLE TO ESTIMATE 266 262   Cardiac Enzymes: No results found for this basename: CKTOTAL, CKMB, CKMBINDEX, TROPONINI,  in the last 168 hours BNP (last 3 results)  Recent Labs  11/29/12 0904  PROBNP 141.2*   CBG: No results found for this basename: GLUCAP,  in the last 168 hours  Recent Results (from the past 240 hour(s))  CULTURE, BLOOD (ROUTINE X 2)  Status: None   Collection Time    12/02/12 11:45 PM      Result Value Range Status   Specimen Description BLOOD RIGHT ARM   Final   Special Requests BOTTLES DRAWN AEROBIC AND ANAEROBIC 10CC   Final   Culture  Setup Time 12/03/2012 10:23   Final   Culture     Final   Value:        BLOOD CULTURE RECEIVED NO GROWTH TO DATE CULTURE WILL BE HELD FOR 5 DAYS BEFORE ISSUING A FINAL NEGATIVE REPORT   Report Status PENDING   Incomplete  CULTURE, BLOOD (ROUTINE X 2)     Status: None   Collection Time    12/02/12 11:50 PM      Result Value Range Status   Specimen Description BLOOD RIGHT HAND   Final   Special Requests BOTTLES DRAWN AEROBIC ONLY 10CC   Final   Culture  Setup Time 12/03/2012 10:24   Final   Culture     Final   Value:        BLOOD CULTURE RECEIVED NO GROWTH TO DATE CULTURE WILL BE HELD FOR 5 DAYS BEFORE ISSUING A  FINAL NEGATIVE REPORT   Report Status PENDING   Incomplete     Studies: Dg Chest Port 1 View  12/04/2012   *RADIOLOGY REPORT*  Clinical Data: Preoperative evaluation for unroofing of liver cyst, history hypertension, smoking  PORTABLE CHEST - 1 VIEW  Comparison: Portable exam 1051 hours compared to 11/29/2012  Findings: Enlargement of cardiac silhouette. Tortuous aorta with atherosclerotic calcification. Mediastinal contours and pulmonary vascularity normal. Bronchitic changes with bibasilar atelectasis versus scarring. No gross infiltrate, pleural effusion or pneumothorax. Bones demineralized.  IMPRESSION: Enlargement of cardiac silhouette. Bronchitic changes with bibasilar atelectasis versus scarring.   Original Report Authenticated By: Ulyses Southward, M.D.    Scheduled Meds: . [facility] HOLD] cefOXitin  2 g Intravenous 30 min Pre-Op  . [facility] HOLD] gabapentin  300 mg Oral QHS  . [facility] HOLD] metoprolol succinate  50 mg Oral BID   Continuous Infusions: . dextrose 5 % and 0.45% NaCl 1,000 mL with potassium chloride 20 mEq infusion 75 mL/hr at 12/04/12 1040    Principal Problem:   Abdominal pain Active Problems:   Hepatic cyst   HTN (hypertension)   Marfans syndrome    Time spent: > 35 minutes    Leslie Dean  [facility] Pager [phone]. If 7PM-7AM, please contact night-coverage at [email or web address], password [email or web address] 12/04/2012, 11:57 AM  LOS: 2 days

## 2012-12-04 NOTE — Progress Notes (Signed)
To OR today.

## 2012-12-04 NOTE — Transfer of Care (Signed)
Immediate Anesthesia Transfer of Care Note  Patient: Leslie Dean  Procedure(s) Performed: Procedure(s): LAPAROSCOPIC Liver Cyst Unroofing and removal of gallbladder (N/A)  Patient Location: PACU  Anesthesia Type:General  Level of Consciousness: awake, alert  and oriented  Airway & Oxygen Therapy: Patient Spontanous Breathing and Patient connected to face mask oxygen  Post-op Assessment: Report given to PACU RN and Post -op Vital signs reviewed and stable  Post vital signs: Reviewed and stable  Complications: No apparent anesthesia complications

## 2012-12-04 NOTE — Progress Notes (Signed)
Patient ID: Leslie Dean, female   DOB: 1945-10-12, 67 y.o.   MRN: [id number]    Subjective: Pt feels ok.  Some intermittent right sided abdominal pain.   Objective: Vital signs in last 24 hours: Temp:  [97.7 F (36.5 C)-98.8 F (37.1 C)] 98.1 F (36.7 C) (06/27 0500) Pulse Rate:  [69-94] 69 (06/27 0500) Resp:  [18-20] 20 (06/27 0500) BP: (115-132)/(70-88) 132/75 mmHg (06/27 0500) SpO2:  [91 %-100 %] 95 % (06/27 0500) Weight:  [210 lb 8.6 oz (95.5 kg)] 210 lb 8.6 oz (95.5 kg) (06/26 1847) Last BM Date: 12/02/12  Intake/Output from previous day: 06/26 0701 - 06/27 0700 In: 1094.2 [P.O.:840; I.V.:254.2] Out: 3 [Urine:3] Intake/Output this shift:    PE: Abd: soft, palpable cyst on right side of abdomen, +BS, mild distention secondary to cyst Heart: regular Lungs: CTAB  Lab Results:   Recent Labs  12/03/12 0710 12/04/12 0530  WBC 10.8* 9.1  HGB 13.2 13.4  HCT 41.3 42.9  PLT 266 262   BMET  Recent Labs  12/02/12 1735 12/03/12 0710  NA 138 139  K 4.4 4.4  CL 99 105  CO2 23 26  GLUCOSE 132* 101*  BUN 13 15  CREATININE 0.99 0.87  CALCIUM 10.1 9.4   PT/INR  Recent Labs  12/02/12 2346  LABPROT 13.2  INR 1.02   CMP     Component Value Date/Time   NA 139 12/03/2012 0710   K 4.4 12/03/2012 0710   CL 105 12/03/2012 0710   CO2 26 12/03/2012 0710   GLUCOSE 101* 12/03/2012 0710   BUN 15 12/03/2012 0710   CREATININE 0.87 12/03/2012 0710   CALCIUM 9.4 12/03/2012 0710   PROT 7.5 12/02/2012 1735   ALBUMIN 3.6 12/02/2012 1735   AST 23 12/02/2012 1735   ALT 27 12/02/2012 1735   ALKPHOS 169* 12/02/2012 1735   BILITOT 0.5 12/02/2012 1735   GFRNONAA 68* 12/03/2012 0710   GFRAA 79* 12/03/2012 0710   Lipase     Component Value Date/Time   LIPASE 19 12/02/2012 1735       Studies/Results: No results found.  Anti-infectives: Anti-infectives   Start     Dose/Rate Route Frequency Ordered Stop   12/04/12 1015  cefOXitin (MEFOXIN) 2 g in dextrose 5 % 50 mL IVPB     Comments:  On call to OR   2 g 100 mL/hr over 30 Minutes Intravenous  Once 12/04/12 1012     12/03/12 1500  cefTRIAXone (ROCEPHIN) 1 g in dextrose 5 % 50 mL IVPB     1 g 100 mL/hr over 30 Minutes Intravenous  Once 12/03/12 1407 12/03/12 1718       Assessment/Plan  1. Large liver cyst, suspect benign 2. Marfan's syndrome 3. HTN 4. Spinal stenosis  Plan: 1. Will get pre-op EKG and CXR.  Her EKG a couple of days ago had some concerning findings.  Will repeat and see how it looks today.   2. Plan for lap liver cyst unroofing today.  I have discussed the procedure along with risks and complications to the patient.  She is agreeable and understands.   LOS: 2 days    Diesel Lina E 12/04/2012, 10:12 AM Pager: [phone]

## 2012-12-04 NOTE — Progress Notes (Signed)
Dr. Rica Mast in to see patient- made aware of patient's condition- to observe patient.

## 2012-12-04 NOTE — Anesthesia Preprocedure Evaluation (Signed)
Anesthesia Evaluation  Patient identified by MRN, date of birth, ID band Patient awake    Reviewed: Allergy & Precautions, H&P , NPO status , Patient's Chart, lab work & pertinent test results  Airway Mallampati: II TM Distance: >3 FB Neck ROM: Full    Dental no notable dental hx. (+) Partial Lower   Pulmonary Current Smoker,  breath sounds clear to auscultation  Pulmonary exam normal       Cardiovascular hypertension, Pt. on medications Rhythm:Regular Rate:Normal  RBBB Marfans syndrome.    Neuro/Psych negative neurological ROS  negative psych ROS   GI/Hepatic negative GI ROS, Neg liver ROS,   Endo/Other  negative endocrine ROS  Renal/GU negative Renal ROS  negative genitourinary   Musculoskeletal negative musculoskeletal ROS (+)   Abdominal   Peds negative pediatric ROS (+)  Hematology negative hematology ROS (+)   Anesthesia Other Findings   Reproductive/Obstetrics negative OB ROS                           Anesthesia Physical Anesthesia Plan  ASA: III  Anesthesia Plan: General   Post-op Pain Management:    Induction: Intravenous  Airway Management Planned: Oral ETT  Additional Equipment:   Intra-op Plan:   Post-operative Plan: Extubation in OR  Informed Consent: I have reviewed the patients History and Physical, chart, labs and discussed the procedure including the risks, benefits and alternatives for the proposed anesthesia with the patient or authorized representative who has indicated his/her understanding and acceptance.   Dental advisory given  Plan Discussed with: CRNA  Anesthesia Plan Comments: (Type and screen. Two PIV's)        Anesthesia Quick Evaluation

## 2012-12-04 NOTE — H&P (View-Only) (Signed)
Reason for Consult: Abdominal pain, Large Hepatic cyst Referring Physician: Kela Millin, MD  Leslie Dean is an 67 y.o. female.  HPI: 67 y.o. female with known history of Marfan's syndrome, hypertension has been experiencing right-sided abdominal pain for last week which has been gradually worsened. Patient came to the ER 3 days ago and had CT angiogram of the chest and abdomen pelvis which showed a large hepatic cyst and was referred to gastroenterologist. Patient's pain continued to worsen and presented to the ER again. At that time the patient was admitted for pain management and surgical consultation. Patient denies any vomiting but has some mild nausea.  We are asked to see the patient for further surgical evaluation.   Past Medical History  Diagnosis Date  . Hypertension   . Scoliosis   . Spinal stenosis     Past Surgical History  Procedure Laterality Date  . Cesarean section    . Eye surgery      Family History  Problem Relation Age of Onset  . Marfan syndrome Grandchild     Social History:  reports that she has been smoking.  She does not have any smokeless tobacco history on file. She reports that she does not drink alcohol or use illicit drugs.  Allergies:  Allergies  Allergen Reactions  . Other     Says narcotics cause nausea for her    Medications: I have reviewed the patient's current medications.  Results for orders placed during the hospital encounter of 12/02/12 (from the past 48 hour(s))  CBC WITH DIFFERENTIAL     Status: Abnormal   Collection Time    12/02/12  5:35 PM      Result Value Range   WBC 14.7 (*) 4.0 - 10.5 K/uL   Comment: WHITE COUNT CONFIRMED ON SMEAR   RBC 5.05  3.87 - 5.11 MIL/uL   Hemoglobin 15.2 (*) 12.0 - 15.0 g/dL   HCT 91.4  78.2 - 95.6 %   MCV 90.1  78.0 - 100.0 fL   MCH 30.1  26.0 - 34.0 pg   MCHC 33.4  30.0 - 36.0 g/dL   RDW 21.3  08.6 - 57.8 %   Platelets PLATELET CLUMPS NOTED ON SMEAR, UNABLE TO ESTIMATE  150 - 400 K/uL    Comment: PLATELET CLUMPING, SUGGEST RECOLLECTION OF SAMPLE IN CITRATE TUBE.   Neutrophils Relative % 70  43 - 77 %   Lymphocytes Relative 21  12 - 46 %   Monocytes Relative 7  3 - 12 %   Eosinophils Relative 2  0 - 5 %   Basophils Relative 0  0 - 1 %   Neutro Abs 10.3 (*) 1.7 - 7.7 K/uL   Lymphs Abs 3.1  0.7 - 4.0 K/uL   Monocytes Absolute 1.0  0.1 - 1.0 K/uL   Eosinophils Absolute 0.3  0.0 - 0.7 K/uL   Basophils Absolute 0.0  0.0 - 0.1 K/uL   Smear Review PLATELET CLUMPS NOTED ON SMEAR    COMPREHENSIVE METABOLIC PANEL     Status: Abnormal   Collection Time    12/02/12  5:35 PM      Result Value Range   Sodium 138  135 - 145 mEq/L   Potassium 4.4  3.5 - 5.1 mEq/L   Chloride 99  96 - 112 mEq/L   CO2 23  19 - 32 mEq/L   Glucose, Bld 132 (*) 70 - 99 mg/dL   BUN 13  6 - 23 mg/dL  Creatinine, Ser 0.99  0.50 - 1.10 mg/dL   Calcium 78.4  8.4 - 69.6 mg/dL   Total Protein 7.5  6.0 - 8.3 g/dL   Albumin 3.6  3.5 - 5.2 g/dL   AST 23  0 - 37 U/L   ALT 27  0 - 35 U/L   Alkaline Phosphatase 169 (*) 39 - 117 U/L   Total Bilirubin 0.5  0.3 - 1.2 mg/dL   GFR calc non Af Amer 58 (*) >90 mL/min   GFR calc Af Amer 67 (*) >90 mL/min   Comment:            The eGFR has been calculated     using the CKD EPI equation.     This calculation has not been     validated in all clinical     situations.     eGFR's persistently     <90 mL/min signify     possible Chronic Kidney Disease.  LIPASE, BLOOD     Status: None   Collection Time    12/02/12  5:35 PM      Result Value Range   Lipase 19  11 - 59 U/L  POCT I-STAT TROPONIN I     Status: None   Collection Time    12/02/12  5:50 PM      Result Value Range   Troponin i, poc 0.01  0.00 - 0.08 ng/mL   Comment 3            Comment: Due to the release kinetics of cTnI,     a negative result within the first hours     of the onset of symptoms does not rule out     myocardial infarction with certainty.     If myocardial infarction is still  suspected,     repeat the test at appropriate intervals.  URINALYSIS, ROUTINE W REFLEX MICROSCOPIC     Status: Abnormal   Collection Time    12/02/12 11:04 PM      Result Value Range   Color, Urine STRAW (*) YELLOW   APPearance CLEAR  CLEAR   Specific Gravity, Urine 1.029  1.005 - 1.030   pH 5.0  5.0 - 8.0   Glucose, UA NEGATIVE  NEGATIVE mg/dL   Hgb urine dipstick NEGATIVE  NEGATIVE   Bilirubin Urine MODERATE (*) NEGATIVE   Ketones, ur 15 (*) NEGATIVE mg/dL   Protein, ur 30 (*) NEGATIVE mg/dL   Urobilinogen, UA 1.0  0.0 - 1.0 mg/dL   Nitrite NEGATIVE  NEGATIVE   Leukocytes, UA NEGATIVE  NEGATIVE  URINE MICROSCOPIC-ADD ON     Status: Abnormal   Collection Time    12/02/12 11:04 PM      Result Value Range   Squamous Epithelial / LPF RARE  RARE   WBC, UA 0-2  <3 WBC/hpf   RBC / HPF 0-2  <3 RBC/hpf   Bacteria, UA RARE  RARE   Casts HYALINE CASTS (*) NEGATIVE  PROTIME-INR     Status: None   Collection Time    12/02/12 11:46 PM      Result Value Range   Prothrombin Time 13.2  11.6 - 15.2 seconds   INR 1.02  0.00 - 1.49  APTT     Status: None   Collection Time    12/02/12 11:46 PM      Result Value Range   aPTT 35  24 - 37 seconds  BASIC METABOLIC PANEL     Status: Abnormal  Collection Time    12/03/12  7:10 AM      Result Value Range   Sodium 139  135 - 145 mEq/L   Potassium 4.4  3.5 - 5.1 mEq/L   Chloride 105  96 - 112 mEq/L   CO2 26  19 - 32 mEq/L   Glucose, Bld 101 (*) 70 - 99 mg/dL   BUN 15  6 - 23 mg/dL   Creatinine, Ser 1.61  0.50 - 1.10 mg/dL   Calcium 9.4  8.4 - 09.6 mg/dL   GFR calc non Af Amer 68 (*) >90 mL/min   GFR calc Af Amer 79 (*) >90 mL/min   Comment:            The eGFR has been calculated     using the CKD EPI equation.     This calculation has not been     validated in all clinical     situations.     eGFR's persistently     <90 mL/min signify     possible Chronic Kidney Disease.  CBC     Status: Abnormal   Collection Time    12/03/12   7:10 AM      Result Value Range   WBC 10.8 (*) 4.0 - 10.5 K/uL   RBC 4.48  3.87 - 5.11 MIL/uL   Hemoglobin 13.2  12.0 - 15.0 g/dL   HCT 04.5  40.9 - 81.1 %   MCV 92.2  78.0 - 100.0 fL   MCH 29.5  26.0 - 34.0 pg   MCHC 32.0  30.0 - 36.0 g/dL   RDW 91.4  78.2 - 95.6 %   Platelets 266  150 - 400 K/uL    No results found.  Review of Systems  Gastrointestinal: Positive for nausea and abdominal pain. Negative for vomiting, diarrhea, constipation and blood in stool.  All other systems reviewed and are negative.   Blood pressure 114/67, pulse 88, temperature 97.8 F (36.6 C), temperature source Oral, resp. rate 18, height 5\' 7"  (1.702 m), weight 208 lb 12.4 oz (94.7 kg), SpO2 96.00%. Physical Exam  Constitutional: She is oriented to person, place, and time. She appears well-developed and well-nourished. No distress.  HENT:  Head: Normocephalic and atraumatic.  Eyes: Conjunctivae are normal. Pupils are equal, round, and reactive to light.  Neck: Normal range of motion. Neck supple.  Cardiovascular: Normal rate and regular rhythm.   Respiratory: Effort normal and breath sounds normal.  GI: Bowel sounds are normal. She exhibits mass. There is tenderness.  Tender esp along right flank and around to back, large palp mass along the right upper abdominal, tender, +bs  Genitourinary:  deferred  Musculoskeletal: Normal range of motion. She exhibits no edema.  Neurological: She is alert and oriented to person, place, and time.  Skin: Skin is warm and dry.  Psychiatric: She has a normal mood and affect. Her behavior is normal. Thought content normal.    Assessment/Plan: Abdominal pain, Large hepatic cyst: our surgeon who specializes in the type of procedures that would most likely be appropriate for the patient is Dr. Donell Beers who is currently at [facility].  The patient will be transferred there for further evaluation and possible surgical intervention.  Dr. Donell Beers will see the patient  after she arrives at [facility], the attending team here has been notified and will arrange transfer.  WHITE, ELIZABETH 12/03/2012, 12:22 PM

## 2012-12-04 NOTE — Anesthesia Postprocedure Evaluation (Signed)
Anesthesia Post Note  Patient: Leslie Dean  Procedure(s) Performed: Procedure(s) (LRB): LAPAROSCOPIC Liver Cyst Unroofing and removal of gallbladder (N/A)  Anesthesia type: General  Patient location: PACU  Post pain: Pain level controlled  Post assessment: Post-op Vital signs reviewed  Last Vitals:  Filed Vitals:   12/04/12 1630  BP: 144/67  Pulse: 56  Temp:   Resp: 22    Post vital signs: Reviewed  Level of consciousness: sedated  Complications: No apparent anesthesia complications

## 2012-12-04 NOTE — Interval H&P Note (Signed)
History and Physical Interval Note:  12/04/2012 10:14 AM  Leslie Dean  has presented today for surgery, with the diagnosis of infected liver cyst  The various methods of treatment have been discussed with the patient and family. After consideration of risks, benefits and other options for treatment, the patient has consented to  Procedure(s): LAPAROSCOPIC Liver Cyst Unroofing (N/A) as a surgical intervention .  The patient's history has been reviewed, patient examined, no change in status, stable for surgery.  I have reviewed the patient's chart and labs.  Questions were answered to the patient's satisfaction.     [name]

## 2012-12-04 NOTE — H&P (View-Only) (Signed)
Patient to be seen by Dr. Byerly at [facility].  Srihitha Tagliaferri O. Jaydis Duchene, III, MD, FACS [phone]--pager [phone]--office [facility]  

## 2012-12-04 NOTE — Op Note (Signed)
PRE-OPERATIVE DIAGNOSIS: hemorrhagic/infected liver cyst  POST-OPERATIVE DIAGNOSIS:  Same  PROCEDURE:  Procedure(s): Laparoscopic liver cyst unroofing, cholecystectomy  SURGEON:  Surgeon(s): Almond Lint, MD  ASSISTANT: Barnetta Chapel, PA-C  ANESTHESIA:   local and general  DRAINS: none   LOCAL MEDICATIONS USED:  MARCAINE    and XYLOCAINE   SPECIMEN:  Source of Specimen:  liver cyst wall, gallbladder, cyst aspirate  DISPOSITION OF SPECIMEN:  PATHOLOGY and microbiology  COUNTS:  YES  DICTATION: .Dragon Dictation  PLAN OF CARE: Admit to inpatient   PATIENT DISPOSITION:  PACU - hemodynamically stable.   FINDINGS:  5 L liver cyst contents, unilocular, gallbladder adherent to cyst wall.    EBL:  Minimal    PROCEDURE:  Patient was identified in the holding area and taken to the operating room where she was placed on operating table. General anesthesia was induced. A Foley catheter was placed. Her abdomen was prepped and draped in sterile fashion. Timeout was performed according to the surgical safety checklist. When all was correct we continued.  5 mm left subcostal Optiview port was placed after administration of local anesthesia.  This was done under direct visualization. A 5 mm port was placed in the left lateral abdomen. There were some adhesions to the midline and these were taken down. The left subcostal port was upsize to a 12 mm port. A 5 mm port was then placed infraumbilically. A large laparoscopic needle was used to aspirate the cyst partially to send specimen for cytology and microbiology. The suction-irrigator was then used to aspirate the remainder of the cyst. There was a total of 5 L of contents of the cyst.  The cyst wall was then taken off the liver parenchyma with the harmonic scalpel. Areas of bleeding were addressed with the cautery. The gallbladder was adherent to the cyst wall as well. This was dissected down in a retrograde fashion. The cystic duct and cystic  artery were skeletonized and triply clipped on the patient's side. These were then divided with scissors. The cyst wall continued to be removed with the harmonic. Once the free cyst wall was completely divided from the liver, the gallbladder were retrieved in an Endo Catch bag through the 12 mm port. It had to be enlarged to get the bag out.  The remaining liver and posterior cyst wall is adherent to the liver was examined for hemostasis. There was no evidence of bleeding. The omentum was placed in the liver bed as best as possible. The pneumoperitoneum was then allowed to evacuate. The trocars were removed. The 12 mm trocar fascia was closed with 0 Vicryl running suture. The skin was then closed using 4-0 monocryl running subcuticular fashion.  The wounds were cleaned, dried, and dressed with dermabond.    Needle, sponge, and instrument counts were correct times 2.  The patient was extubated and taken to PACU in stable condition.

## 2012-12-05 LAB — BASIC METABOLIC PANEL
BUN: 8 mg/dL (ref 6–23)
CO2: 28 mEq/L (ref 19–32)
Calcium: 9.1 mg/dL (ref 8.4–10.5)
Chloride: 98 mEq/L (ref 96–112)
Creatinine, Ser: 0.71 mg/dL (ref 0.50–1.10)
GFR calc Af Amer: >90 mL/min (ref >90)
GFR calc non Af Amer: 88 mL/min — ABNORMAL LOW (ref >90)
Glucose, Bld: 125 mg/dL — ABNORMAL HIGH (ref 70–99)
Potassium: 4 mEq/L (ref 3.5–5.1)
Sodium: 133 mEq/L — ABNORMAL LOW (ref 135–145)

## 2012-12-05 LAB — CBC
HCT: 38.5 % (ref 36.0–46.0)
Hemoglobin: 12.1 g/dL (ref 12.0–15.0)
MCH: 28.3 pg (ref 26.0–34.0)
MCHC: 31.4 g/dL (ref 30.0–36.0)
MCV: 90.2 fL (ref 78.0–100.0)
Platelets: 246 10*3/uL (ref 150–400)
RBC: 4.27 MIL/uL (ref 3.87–5.11)
RDW: 14 % (ref 11.5–15.5)
WBC: 11.9 10*3/uL — ABNORMAL HIGH (ref 4.0–10.5)

## 2012-12-05 MED ORDER — SODIUM CHLORIDE 0.9 % IV BOLUS (SEPSIS)
500.0000 mL | Freq: Once | INTRAVENOUS | Status: AC
  Administered 2012-12-05: 500 mL via INTRAVENOUS

## 2012-12-05 MED ORDER — DIPHENHYDRAMINE HCL 25 MG PO CAPS: 25.0000 mg | ORAL_CAPSULE | Freq: Every evening | ORAL | Status: DC | PRN

## 2012-12-05 MED ORDER — IMIPENEM-CILASTATIN 500 MG IV SOLR
500.0000 mg | Freq: Four times a day (QID) | INTRAVENOUS | Status: DC
  Administered 2012-12-05 – 2012-12-06 (×5): 500 mg via INTRAVENOUS
  Filled 2012-12-05 (×6): qty 500

## 2012-12-05 MED ORDER — ZOLPIDEM TARTRATE 5 MG PO TABS: 5.0000 mg | ORAL_TABLET | Freq: Every evening | ORAL | Status: DC | PRN

## 2012-12-05 NOTE — Progress Notes (Signed)
1 Day Post-Op  Subjective: Pt feels bloated.  Not hungry.  Denies flatus or BM.  Has only had coffee.  No n/v.    Objective: Vital signs in last 24 hours: Temp:  [97.7 F (36.5 C)-100.8 F (38.2 C)] 99.5 F (37.5 C) (06/28 0600) Pulse Rate:  [51-75] 74 (06/28 0600) Resp:  [13-24] 20 (06/28 0600) BP: (120-152)/(44-78) 120/70 mmHg (06/28 0600) SpO2:  [91 %-100 %] 93 % (06/28 0600) Last BM Date: 12/02/12  Intake/Output from previous day: 06/27 0701 - 06/28 0700 In: 3325 [I.V.:3325] Out: 6750 [Urine:1750] Intake/Output this shift:    General appearance: alert, cooperative and no distress Resp: breathing comfortably GI: soft, approp tender, sl distended, inc c/d/i  Lab Results:   Recent Labs  12/04/12 0530 12/05/12 0545  WBC 9.1 11.9*  HGB 13.4 12.1  HCT 42.9 38.5  PLT 262 246   BMET  Recent Labs  12/03/12 0710 12/04/12 0530 12/05/12 0545  NA 139  --  133*  K 4.4  --  4.0  CL 105  --  98  CO2 26  --  28  GLUCOSE 101*  --  125*  BUN 15  --  8  CREATININE 0.87 0.86 0.71  CALCIUM 9.4  --  9.1   PT/INR  Recent Labs  12/02/12 2346  LABPROT 13.2  INR 1.02   ABG No results found for this basename: PHART, PCO2, PO2, HCO3,  in the last 72 hours  Studies/Results: Dg Chest Port 1 View  12/04/2012   *RADIOLOGY REPORT*  Clinical Data: Preoperative evaluation for unroofing of liver cyst, history hypertension, smoking  PORTABLE CHEST - 1 VIEW  Comparison: Portable exam 1051 hours compared to 11/29/2012  Findings: Enlargement of cardiac silhouette. Tortuous aorta with atherosclerotic calcification. Mediastinal contours and pulmonary vascularity normal. Bronchitic changes with bibasilar atelectasis versus scarring. No gross infiltrate, pleural effusion or pneumothorax. Bones demineralized.  IMPRESSION: Enlargement of cardiac silhouette. Bronchitic changes with bibasilar atelectasis versus scarring.   Original Report Authenticated By: Ulyses Southward, M.D.     Anti-infectives: Anti-infectives   Start     Dose/Rate Route Frequency Ordered Stop   12/04/12 1016  [MAR Hold]  cefOXitin (MEFOXIN) 2 g in dextrose 5 % 50 mL IVPB     (On MAR Hold since 12/04/12 1126)  Comments:  On call to OR   2 g 100 mL/hr over 30 Minutes Intravenous 30 min pre-op 12/04/12 1012 12/04/12 1218   12/03/12 1500  cefTRIAXone (ROCEPHIN) 1 g in dextrose 5 % 50 mL IVPB     1 g 100 mL/hr over 30 Minutes Intravenous  Once 12/03/12 1407 12/03/12 1718      Assessment/Plan: s/p Procedure(s): LAPAROSCOPIC Liver Cyst Unroofing and removal of gallbladder (N/A) d/c foley Advance diet pulmonary toilet.    LOS: 3 days    [facility] 12/05/2012

## 2012-12-05 NOTE — Progress Notes (Signed)
ANTIBIOTIC CONSULT NOTE - INITIAL  Pharmacy Consult for Primaxin Indication: possible abdominal infection  Allergies  Allergen Reactions  . Other     Says narcotics cause nausea for her    Patient Measurements: Height: 5\' 7"  (170.2 cm) Weight: 210 lb 8.6 oz (95.5 kg) IBW/kg (Calculated) : 61.6  Vital Signs: Temp: 101 F (38.3 C) (06/28 1359) Temp src: Oral (06/28 1359) BP: 90/47 mmHg (06/28 1359) Pulse Rate: 90 (06/28 1359) Intake/Output from previous day: 06/27 0701 - 06/28 0700 In: 3325 [I.V.:3325] Out: 6750 [Urine:1750] Intake/Output from this shift: Total I/O In: 480 [P.O.:480] Out: 1875 [Urine:1875]  Labs:  Recent Labs  12/03/12 0710 12/04/12 0530 12/05/12 0545  WBC 10.8* 9.1 11.9*  HGB 13.2 13.4 12.1  PLT 266 262 246  CREATININE 0.87 0.86 0.71   Estimated Creatinine Clearance: 82.1 ml/min (by C-G formula based on Cr of 0.71). No results found for this basename: VANCOTROUGH, Leodis Binet, VANCORANDOM, GENTTROUGH, GENTPEAK, GENTRANDOM, TOBRATROUGH, TOBRAPEAK, TOBRARND, AMIKACINPEAK, AMIKACINTROU, AMIKACIN,  in the last 72 hours   Microbiology: Recent Results (from the past 720 hour(s))  CULTURE, BLOOD (ROUTINE X 2)     Status: None   Collection Time    12/02/12 11:45 PM      Result Value Range Status   Specimen Description BLOOD RIGHT ARM   Final   Special Requests BOTTLES DRAWN AEROBIC AND ANAEROBIC 10CC   Final   Culture  Setup Time 12/03/2012 10:23   Final   Culture     Final   Value:        BLOOD CULTURE RECEIVED NO GROWTH TO DATE CULTURE WILL BE HELD FOR 5 DAYS BEFORE ISSUING A FINAL NEGATIVE REPORT   Report Status PENDING   Incomplete  CULTURE, BLOOD (ROUTINE X 2)     Status: None   Collection Time    12/02/12 11:50 PM      Result Value Range Status   Specimen Description BLOOD RIGHT HAND   Final   Special Requests BOTTLES DRAWN AEROBIC ONLY 10CC   Final   Culture  Setup Time 12/03/2012 10:24   Final   Culture     Final   Value:        BLOOD  CULTURE RECEIVED NO GROWTH TO DATE CULTURE WILL BE HELD FOR 5 DAYS BEFORE ISSUING A FINAL NEGATIVE REPORT   Report Status PENDING   Incomplete  MRSA PCR SCREENING     Status: None   Collection Time    12/04/12 10:35 AM      Result Value Range Status   MRSA by PCR NEGATIVE  NEGATIVE Final   Comment:            The GeneXpert MRSA Assay (FDA     approved for NASAL specimens     only), is one component of a     comprehensive MRSA colonization     surveillance program. It is not     intended to diagnose MRSA     infection nor to guide or     monitor treatment for     MRSA infections.  BODY FLUID CULTURE     Status: None   Collection Time    12/04/12  1:03 PM      Result Value Range Status   Specimen Description CYSTS FLUID FROM LIVER CYST   Final   Special Requests NONE   Final   Gram Stain     Final   Value: NO WBC SEEN     NO ORGANISMS  SEEN   Culture PENDING   Incomplete   Report Status PENDING   Incomplete    Medical History: Past Medical History  Diagnosis Date  . Hypertension   . Scoliosis   . Spinal stenosis     Medications:  Scheduled:  . gabapentin  300 mg Oral QHS  . heparin  5,000 Units Subcutaneous Q8H  . metoprolol succinate  50 mg Oral BID  . sodium chloride  500 mL Intravenous Once   Infusions:  . dextrose 5 % and 0.45 % NaCl with KCl 20 mEq/L 75 mL/hr at 12/05/12 1610   Assessment: 67 yo POD1 laparoscopic liver cyst unroofing and colecystectomy starting Primaxin per pharmacy for possible abdominal infection  Normalized CrCl 88 ml/min/1.55m2  Goal of Therapy:  adjustment per renal function  Plan:  Primaxin 500mg  IV q6   Hessie Knows, PharmD, BCPS Pager [phone] 12/05/2012 2:21 PM

## 2012-12-05 NOTE — Progress Notes (Signed)
[facility] PROGRESS NOTE  Leslie Dean ZOX:[id number] DOB: 08-31-1945 DOA: 12/02/2012 PCP: No PCP Per Patient  Assessment/Plan: 1. Abdominal pain - Most likely due to hepatic cyst/cholecystitis. Patient is s/p day 1 cyst unroofing and cholecystectomy. - General surgery managing and advancing diet 6/28 - Continue with pain control  2. Liver cyst - unroofing to be done as mentioned above - Patient to have fluid sent for cytology.  Will plan on having patient follow up with General surgery as outpatient for fluid cytology reports.  3. HTN - Patient is on metoprolol (toprol-xl) 50 mg po BID - Blood pressure is relatively well controlled  4. Marfan syndrome - patient to follow up with her pcp for continued evaluation and recommendations.   Code Status: full Family Communication: no family at bedside.  Disposition Plan: Advancing diet. Once cleared from surgery standpoint. Pt to f/u with general surgeon for test results.  Consultants:  General surgery: Dr. Donell Beers   Procedures:  As mentioned above.  Antibiotics:  On cefoxitin  HPI/Subjective: Pt states her abdominal discomfort is well controlled. No acute issues reported overnight.  Objective: Filed Vitals:   12/04/12 2030 12/04/12 2200 12/05/12 0200 12/05/12 0600  BP: 133/72 140/68 126/76 120/70  Pulse: 64 70 75 74  Temp: 99 F (37.2 C) 99.3 F (37.4 C) 100.8 F (38.2 C) 99.5 F (37.5 C)  TempSrc: Oral Oral Oral Oral  Resp: 18 18 20 20   Height:      Weight:      SpO2: 95% 92% 94% 93%    Intake/Output Summary (Last 24 hours) at 12/05/12 1326 Last data filed at 12/05/12 1100  Gross per 24 hour  Intake   2565 ml  Output   3225 ml  Net   -660 ml   Filed Weights   12/03/12 0556 12/03/12 1847  Weight: 94.7 kg (208 lb 12.4 oz) 95.5 kg (210 lb 8.6 oz)    Exam:   General:  Pt in NAD, Alert and awake  Cardiovascular: RRR, no MRG  Respiratory: CTA BL, no wheezes  Abdomen: soft, + bowel sounds,  ND  Musculoskeletal: no cyanosis or clubbing   Data Reviewed: Basic Metabolic Panel:  Recent Labs Lab 11/29/12 0845 12/02/12 1735 12/03/12 0710 12/04/12 0530 12/05/12 0545  NA 138 138 139  --  133*  K 4.5 4.4 4.4  --  4.0  CL 101 99 105  --  98  CO2 28 23 26   --  28  GLUCOSE 136* 132* 101*  --  125*  BUN 14 13 15   --  8  CREATININE 0.86 0.99 0.87 0.86 0.71  CALCIUM 9.7 10.1 9.4  --  9.1   Liver Function Tests:  Recent Labs Lab 11/29/12 0930 12/02/12 1735  AST 44* 23  ALT 43* 27  ALKPHOS 176* 169*  BILITOT 0.7 0.5  PROT 7.1 7.5  ALBUMIN 3.7 3.6    Recent Labs Lab 11/29/12 0930 12/02/12 1735  LIPASE 24 19   No results found for this basename: AMMONIA,  in the last 168 hours CBC:  Recent Labs Lab 11/29/12 0845 12/02/12 1735 12/03/12 0710 12/04/12 0530 12/05/12 0545  WBC 16.1* 14.7* 10.8* 9.1 11.9*  NEUTROABS  --  10.3*  --   --   --   HGB 14.6 15.2* 13.2 13.4 12.1  HCT 45.5 45.5 41.3 42.9 38.5  MCV 92.7 90.1 92.2 92.7 90.2  PLT 254 PLATELET CLUMPS NOTED ON SMEAR, UNABLE TO ESTIMATE 266 262 246   Cardiac Enzymes: No  results found for this basename: CKTOTAL, CKMB, CKMBINDEX, TROPONINI,  in the last 168 hours BNP (last 3 results)  Recent Labs  11/29/12 0904  PROBNP 141.2*   CBG: No results found for this basename: GLUCAP,  in the last 168 hours  Recent Results (from the past 240 hour(s))  CULTURE, BLOOD (ROUTINE X 2)     Status: None   Collection Time    12/02/12 11:45 PM      Result Value Range Status   Specimen Description BLOOD RIGHT ARM   Final   Special Requests BOTTLES DRAWN AEROBIC AND ANAEROBIC 10CC   Final   Culture  Setup Time 12/03/2012 10:23   Final   Culture     Final   Value:        BLOOD CULTURE RECEIVED NO GROWTH TO DATE CULTURE WILL BE HELD FOR 5 DAYS BEFORE ISSUING A FINAL NEGATIVE REPORT   Report Status PENDING   Incomplete  CULTURE, BLOOD (ROUTINE X 2)     Status: None   Collection Time    12/02/12 11:50 PM       Result Value Range Status   Specimen Description BLOOD RIGHT HAND   Final   Special Requests BOTTLES DRAWN AEROBIC ONLY 10CC   Final   Culture  Setup Time 12/03/2012 10:24   Final   Culture     Final   Value:        BLOOD CULTURE RECEIVED NO GROWTH TO DATE CULTURE WILL BE HELD FOR 5 DAYS BEFORE ISSUING A FINAL NEGATIVE REPORT   Report Status PENDING   Incomplete  MRSA PCR SCREENING     Status: None   Collection Time    12/04/12 10:35 AM      Result Value Range Status   MRSA by PCR NEGATIVE  NEGATIVE Final   Comment:            The GeneXpert MRSA Assay (FDA     approved for NASAL specimens     only), is one component of a     comprehensive MRSA colonization     surveillance program. It is not     intended to diagnose MRSA     infection nor to guide or     monitor treatment for     MRSA infections.  BODY FLUID CULTURE     Status: None   Collection Time    12/04/12  1:03 PM      Result Value Range Status   Specimen Description CYSTS FLUID FROM LIVER CYST   Final   Special Requests NONE   Final   Gram Stain     Final   Value: NO WBC SEEN     NO ORGANISMS SEEN   Culture PENDING   Incomplete   Report Status PENDING   Incomplete     Studies: Dg Chest Port 1 View  12/04/2012   *RADIOLOGY REPORT*  Clinical Data: Preoperative evaluation for unroofing of liver cyst, history hypertension, smoking  PORTABLE CHEST - 1 VIEW  Comparison: Portable exam 1051 hours compared to 11/29/2012  Findings: Enlargement of cardiac silhouette. Tortuous aorta with atherosclerotic calcification. Mediastinal contours and pulmonary vascularity normal. Bronchitic changes with bibasilar atelectasis versus scarring. No gross infiltrate, pleural effusion or pneumothorax. Bones demineralized.  IMPRESSION: Enlargement of cardiac silhouette. Bronchitic changes with bibasilar atelectasis versus scarring.   Original Report Authenticated By: Ulyses Southward, M.D.    Scheduled Meds: . gabapentin  300 mg Oral QHS  . heparin   5,000 Units  Subcutaneous Q8H  . metoprolol succinate  50 mg Oral BID   Continuous Infusions: . dextrose 5 % and 0.45 % NaCl with KCl 20 mEq/L 75 mL/hr at 12/05/12 1610    Principal Problem:   Abdominal pain Active Problems:   Hepatic cyst   HTN (hypertension)   Marfans syndrome    Time spent: > 35 minutes    Leslie Dean  [facility] Pager [phone]. If 7PM-7AM, please contact night-coverage at [email or web address], password [facility] 12/05/2012, 1:26 PM  LOS: 3 days

## 2012-12-06 LAB — COMPREHENSIVE METABOLIC PANEL
ALT: 18 U/L (ref 0–35)
AST: 17 U/L (ref 0–37)
Albumin: 2.2 g/dL — ABNORMAL LOW (ref 3.5–5.2)
Alkaline Phosphatase: 85 U/L (ref 39–117)
BUN: 6 mg/dL (ref 6–23)
CO2: 28 mEq/L (ref 19–32)
Calcium: 9.2 mg/dL (ref 8.4–10.5)
Chloride: 101 mEq/L (ref 96–112)
Creatinine, Ser: 0.62 mg/dL (ref 0.50–1.10)
GFR calc Af Amer: >90 mL/min (ref >90)
GFR calc non Af Amer: >90 mL/min (ref >90)
Glucose, Bld: 121 mg/dL — ABNORMAL HIGH (ref 70–99)
Potassium: 4.1 mEq/L (ref 3.5–5.1)
Sodium: 137 mEq/L (ref 135–145)
Total Bilirubin: 0.4 mg/dL (ref 0.3–1.2)
Total Protein: 5.8 g/dL — ABNORMAL LOW (ref 6.0–8.3)

## 2012-12-06 MED ORDER — HYDROCODONE-ACETAMINOPHEN 5-325 MG PO TABS: 1.0000 | ORAL_TABLET | Freq: Four times a day (QID) | ORAL | Status: DC | PRN

## 2012-12-06 MED ORDER — AMOXICILLIN-POT CLAVULANATE 875-125 MG PO TABS: 1.0000 | ORAL_TABLET | Freq: Two times a day (BID) | ORAL | Status: DC

## 2012-12-06 NOTE — Progress Notes (Signed)
Patient ID: Leslie Dean, female   DOB: Sep 24, 1945, 67 y.o.   MRN: [id number] 2 Days Post-Op  Subjective: Pt feels much better.  She is starving and wants to go home.  However, she had temp of 101.    Objective: Vital signs in last 24 hours: Temp:  [99.3 F (37.4 C)-101 F (38.3 C)] 99.3 F (37.4 C) (06/29 0600) Pulse Rate:  [66-90] 66 (06/29 0600) Resp:  [20] 20 (06/29 0600) BP: (90-136)/(47-71) 114/71 mmHg (06/29 0600) SpO2:  [93 %-99 %] 99 % (06/29 0600) Last BM Date: 12/05/12  Intake/Output from previous day: 06/28 0701 - 06/29 0700 In: 2938.8 [P.O.:480; I.V.:1858.8; IV Piggyback:600] Out: 1875 [Urine:1875] Intake/Output this shift:    General appearance: alert, cooperative and no distress Resp: breathing comfortably GI: soft, approp tender, nondistended, inc c/d/i  Lab Results:   Recent Labs  12/04/12 0530 12/05/12 0545  WBC 9.1 11.9*  HGB 13.4 12.1  HCT 42.9 38.5  PLT 262 246   BMET  Recent Labs  12/04/12 0530 12/05/12 0545  NA  --  133*  K  --  4.0  CL  --  98  CO2  --  28  GLUCOSE  --  125*  BUN  --  8  CREATININE 0.86 0.71  CALCIUM  --  9.1   PT/INR No results found for this basename: LABPROT, INR,  in the last 72 hours ABG No results found for this basename: PHART, PCO2, PO2, HCO3,  in the last 72 hours  Studies/Results: Dg Chest Port 1 View  12/04/2012   *RADIOLOGY REPORT*  Clinical Data: Preoperative evaluation for unroofing of liver cyst, history hypertension, smoking  PORTABLE CHEST - 1 VIEW  Comparison: Portable exam 1051 hours compared to 11/29/2012  Findings: Enlargement of cardiac silhouette. Tortuous aorta with atherosclerotic calcification. Mediastinal contours and pulmonary vascularity normal. Bronchitic changes with bibasilar atelectasis versus scarring. No gross infiltrate, pleural effusion or pneumothorax. Bones demineralized.  IMPRESSION: Enlargement of cardiac silhouette. Bronchitic changes with bibasilar atelectasis versus  scarring.   Original Report Authenticated By: Ulyses Southward, M.D.    Anti-infectives: Anti-infectives   Start     Dose/Rate Route Frequency Ordered Stop   12/05/12 1600  imipenem-cilastatin (PRIMAXIN) 500 mg in sodium chloride 0.9 % 100 mL IVPB     500 mg 200 mL/hr over 30 Minutes Intravenous Every 6 hours 12/05/12 1422     12/04/12 1016  [MAR Hold]  cefOXitin (MEFOXIN) 2 g in dextrose 5 % 50 mL IVPB     (On MAR Hold since 12/04/12 1126)  Comments:  On call to OR   2 g 100 mL/hr over 30 Minutes Intravenous 30 min pre-op 12/04/12 1012 12/04/12 1218   12/03/12 1500  cefTRIAXone (ROCEPHIN) 1 g in dextrose 5 % 50 mL IVPB     1 g 100 mL/hr over 30 Minutes Intravenous  Once 12/03/12 1407 12/03/12 1718      Assessment/Plan: s/p Procedure(s): LAPAROSCOPIC Liver Cyst Unroofing and removal of gallbladder (N/A) Regular diet. Home later today OK with me if fevers improve.   cx cyst fluid is negative. Pathology pending.   Will need 2 week follow up.       LOS: 4 days    [facility] 12/06/2012

## 2012-12-06 NOTE — Discharge Summary (Signed)
Physician Discharge Summary  Leslie Dean NFA:[id number] DOB: [birth year] DOA: 12/02/2012  PCP: No PCP Per Patient  Admit date: 12/02/2012 Discharge date: 12/06/2012  Time spent: > 35 minutes  Recommendations for Outpatient Follow-up:  1. Please be sure to follow up with cytology results of recent hepatic cysts. Discuss results with patient. 2. Please reassess patient and decide whether or not patient will require a more prolonged course of antibiotics  Discharge Diagnoses:  Principal Problem:   Abdominal pain Active Problems:   Hepatic cyst   HTN (hypertension)   Marfans syndrome   Discharge Condition: stable  Diet recommendation: Regular diet  Filed Weights   12/03/12 0556 12/03/12 1847  Weight: 94.7 kg (208 lb 12.4 oz) 95.5 kg (210 lb 8.6 oz)    History of present illness:  Pt is a 67 y/o with h/o Marfan's syndrome and hypertension. Presented with abdominal discomfort to the ED and found to have large hepatic cyst on imaging evaluation.  Hospital Course:  1. Abdominal pain - Most likely due to hepatic cyst/cholecystitis. Patient is s/p day 2 cyst unroofing and cholecystectomy.  - General surgery managing and has advanced diet.  Ok for discharge from surgery standpoint. - Continue with pain control  - Will discharge on 5 more days of augmenting  2. Liver cyst  - unroofing to be done as mentioned above  - Patient to have fluid sent for cytology. Will plan on having patient follow up with General surgery as outpatient for fluid cytology reports and recommendations.   3. HTN  - Blood pressure soft this AM. As per my discussion with patient we will plan on holding her B blocker on discharge. She can follow up with her primary care physician for further recommendations.  4. Marfan syndrome  - patient to follow up with her pcp for continued evaluation and recommendations.    Procedures: LAPAROSCOPIC Liver Cyst Unroofing and removal of gallbladder     Consultations:  Dr. Donell Beers  Discharge Exam: Filed Vitals:   12/05/12 1359 12/05/12 2200 12/06/12 0600 12/06/12 1030  BP: 90/47 136/70 114/71 128/72  Pulse: 90 73 66 68  Temp: 101 F (38.3 C) 99.4 F (37.4 C) 99.3 F (37.4 C)   TempSrc: Oral Oral Oral   Resp: 20 20 20    Height:      Weight:      SpO2: 93% 96% 99%     General: Pt in NAD, ALert and Awake Cardiovascular: RRR, no MRG Respiratory: CTA BL, no wheezes  Discharge Instructions  Discharge Orders   Future Appointments Provider Department Dept Phone   12/09/2012 4:30 PM Ernestene Mention, MD [facility], [place] [phone]   Future Orders Complete By Expires     Diet - low sodium heart healthy  As directed     Discharge instructions  As directed     Comments:      Please be sure to follow up with Dr. Donell Beers for further evaluation and recommendations    Increase activity slowly  As directed         Medication List    STOP taking these medications       metoprolol succinate 50 MG 24 hr tablet  Commonly known as:  TOPROL-XL      TAKE these medications       acetaminophen 500 MG tablet  Commonly known as:  TYLENOL  Take 1,000 mg by mouth every 6 (six) hours as needed for pain.     amoxicillin-clavulanate 875-125 MG per tablet  Commonly known as:  AUGMENTIN  Take 1 tablet by mouth 2 (two) times daily.     aspirin 81 MG tablet  Take 81 mg by mouth daily.     gabapentin 300 MG capsule  Commonly known as:  NEURONTIN  Take 300 mg by mouth at bedtime.     HYDROcodone-acetaminophen 5-325 MG per tablet  Commonly known as:  NORCO/VICODIN  Take 1 tablet by mouth every 6 (six) hours as needed for pain.     ibuprofen 400 MG tablet  Commonly known as:  ADVIL,MOTRIN  Take 400 mg by mouth every 6 (six) hours as needed for pain.     MULTIVITAMIN PO  Take 1 tablet by mouth daily.       Allergies  Allergen Reactions  . Other     Says narcotics cause nausea for her       Follow-up  Information   Follow up with [facility], [place]. Schedule an appointment as soon as possible for a visit in 2 weeks.   Contact information:   [address] [place] [address] [phone]        The results of significant diagnostics from this hospitalization (including imaging, microbiology, ancillary and laboratory) are listed below for reference.    Significant Diagnostic Studies: Ct Abdomen Pelvis Wo Contrast  11/29/2012   *RADIOLOGY REPORT*  Clinical Data: Abdominal l pain, flank pain  CT ABDOMEN AND PELVIS WITHOUT CONTRAST  Technique:  Multidetector CT imaging of the abdomen and pelvis was performed following the standard protocol without intravenous contrast.  Comparison: CT thorax 11/27/2012  Findings: Lung bases are clear.  Non-IV contrast images demonstrate a large multi locular cyst occupying the near entirety of the right hepatic lobe.  This is described on comparison CT chest.  The largest cyst does extend to the level of the right iliac crest measuring 21 cm in craniocaudad dimension.  Pancreas is normal. The kidneys both excrete the IV contrast from the previous CT chest.  There is no evidence of hydronephrosis or obstruction. There is extrarenal pelvis on the right.  Abdominal aorta normal caliber.  No abnormality of the stomach, small bowel, or colon.  No free fluid the pelvis.  There is some pooling of the IV contrast within the vagina adjacent to the introitus. . There is severe focal scoliosis of the spine likely related to congenital in the vertebra and effusion.  IMPRESSION:  1.  Extremely large cyst extending from the right hepatic lobe and occupying the near entirety of the right abdomen. 2.  No evidence of renal obstruction. 3.  A small amount excreted contrast collects within the vagina. This likely relates to incontinence.  Recommend clinical correlation.  No clear evidence of fistula between the bladder and the vagina.   Original Report Authenticated By:  Genevive Bi, M.D.   Ct Angio Chest Pe W/cm &/or Wo Cm  11/29/2012   *RADIOLOGY REPORT*  Clinical Data: Pleuritic right chest pain radiating into back. Associated shortness of breath.  CT ANGIOGRAPHY CHEST  Technique:  Multidetector CT imaging of the chest using the standard protocol during bolus administration of intravenous contrast. Multiplanar reconstructed images including MIPs were obtained and reviewed to evaluate the vascular anatomy.  Contrast: 80mL OMNIPAQUE IOHEXOL 350 MG/ML SOLN  Comparison: None.  Findings: The pulmonary arteries are well opacified and show no evidence of pulmonary embolism.  The ascending thoracic aorta is mildly prominent in caliber at the level of the aortic root to, measuring 4.0 -  4.2 cm in greatest diameter.  There is heavily calcified plaque present in the LAD and at the level of the proximal right coronary artery.  The lungs show no evidence of infiltrates, edema or nodules.  No pleural or pericardial fluid is seen.  Heart is moderately enlarged.  No enlarged lymph nodes are seen.  At the level of the lower neck, the thyroid gland is partially visualized and shows prominent size, nearly encircling the trachea with findings consistent with thyroid goiter.  No significant mass effect on the airway is visualized. No bony abnormalities are identified.  In the visualized upper abdomen, a number of prominent hepatic cysts are identified including a giant cyst in the right lobe measuring up to 21 cm in diameter.  This cyst is incompletely visualized.  There is some mild prominence of visualized intrahepatic bile ducts.  Correlation is suggested with liver function tests.  Eventual imaging of the liver is recommended to fully evaluate the cysts.  IMPRESSION:  1.  No evidence of pulmonary embolism. 2.  Top normal to mildly aneurysmal aortic root measuring 4.0 - 4.2 cm in greatest diameter.  Correlation with echocardiography may be helpful to exclude aortic valvular pathology. 3.   Coronary atherosclerosis with calcified plaque demonstrated in the distribution of the LAD and RCA. 4.  Multiple hepatic cysts identified in the upper liver including a giant right lobe cyst measuring at least 21 cm in diameter. Further evaluation with additional abdominal imaging is recommended. 5.  Thyroid goiter with partial visualization of the thyroid gland.   Original Report Authenticated By: Irish Lack, M.D.   US Abdomen Complete  11/29/2012   *RADIOLOGY REPORT*  Clinical Data:  Hepatomegaly, flank pain.  ABDOMINAL ULTRASOUND COMPLETE  Comparison:  None  Findings.  Gallbladder:  Diminished exam detail due to liver cyst  Common Bile Duct:  Not visualize.  Liver:  Large anechoic mass measuring approximately 20 x 21 x 20 cm.  This corresponds to the fluid attenuating structure within the liver seen on CT  IVC:  Not visualize.  Pancreas:  Not visualize.  Spleen:  Within normal limits in size and echotexture.  Right kidney:  Normal in size and parenchymal echogenicity. Moderate right hydronephrosis.  Left kidney:  Normal in size and parenchymal echogenicity.  No evidence of mass or hydronephrosis.  Abdominal Aorta:  No aneurysm identified.  IMPRESSION: 1.  Large anechoic mass within the right upper quadrant of the abdomen likely represents a giant liver cyst. 2.  There is a right-sided hydronephrosis.  In a patient with flank pain this may be secondary to obstructing renal calculus.  Consider further evaluation with CT of the abdomen pelvis without contrast.   Original Report Authenticated By: Signa Kell, M.D.   Dg Chest Port 1 View  12/04/2012   *RADIOLOGY REPORT*  Clinical Data: Preoperative evaluation for unroofing of liver cyst, history hypertension, smoking  PORTABLE CHEST - 1 VIEW  Comparison: Portable exam 1051 hours compared to 11/29/2012  Findings: Enlargement of cardiac silhouette. Tortuous aorta with atherosclerotic calcification. Mediastinal contours and pulmonary vascularity normal.  Bronchitic changes with bibasilar atelectasis versus scarring. No gross infiltrate, pleural effusion or pneumothorax. Bones demineralized.  IMPRESSION: Enlargement of cardiac silhouette. Bronchitic changes with bibasilar atelectasis versus scarring.   Original Report Authenticated By: Ulyses Southward, M.D.   Dg Chest Port 1 View  11/29/2012   *RADIOLOGY REPORT*  Clinical Data: Chest pain.  PORTABLE CHEST - 1 VIEW  Comparison: 10/21/2012  Findings: There is stable component of chronic lung  disease/COPD. Lung volumes are low bilaterally.  No overt edema or focal airspace consolidation is seen.  Heart size is at the upper limits of normal.  No significant pleural fluid identified.  IMPRESSION: Stable chronic lung disease.   Original Report Authenticated By: Irish Lack, M.D.    Microbiology: Recent Results (from the past 240 hour(s))  CULTURE, BLOOD (ROUTINE X 2)     Status: None   Collection Time    12/02/12 11:45 PM      Result Value Range Status   Specimen Description BLOOD RIGHT ARM   Final   Special Requests BOTTLES DRAWN AEROBIC AND ANAEROBIC 10CC   Final   Culture  Setup Time 12/03/2012 10:23   Final   Culture     Final   Value:        BLOOD CULTURE RECEIVED NO GROWTH TO DATE CULTURE WILL BE HELD FOR 5 DAYS BEFORE ISSUING A FINAL NEGATIVE REPORT   Report Status PENDING   Incomplete  CULTURE, BLOOD (ROUTINE X 2)     Status: None   Collection Time    12/02/12 11:50 PM      Result Value Range Status   Specimen Description BLOOD RIGHT HAND   Final   Special Requests BOTTLES DRAWN AEROBIC ONLY 10CC   Final   Culture  Setup Time 12/03/2012 10:24   Final   Culture     Final   Value:        BLOOD CULTURE RECEIVED NO GROWTH TO DATE CULTURE WILL BE HELD FOR 5 DAYS BEFORE ISSUING A FINAL NEGATIVE REPORT   Report Status PENDING   Incomplete  MRSA PCR SCREENING     Status: None   Collection Time    12/04/12 10:35 AM      Result Value Range Status   MRSA by PCR NEGATIVE  NEGATIVE Final    Comment:            The GeneXpert MRSA Assay (FDA     approved for NASAL specimens     only), is one component of a     comprehensive MRSA colonization     surveillance program. It is not     intended to diagnose MRSA     infection nor to guide or     monitor treatment for     MRSA infections.  BODY FLUID CULTURE     Status: None   Collection Time    12/04/12  1:03 PM      Result Value Range Status   Specimen Description CYSTS FLUID FROM LIVER CYST   Final   Special Requests NONE   Final   Gram Stain     Final   Value: NO WBC SEEN     NO ORGANISMS SEEN   Culture NO GROWTH 1 DAY   Final   Report Status PENDING   Incomplete     Labs: Basic Metabolic Panel:  Recent Labs Lab 12/02/12 1735 12/03/12 0710 12/04/12 0530 12/05/12 0545 12/06/12 1010  NA 138 139  --  133* 137  K 4.4 4.4  --  4.0 4.1  CL 99 105  --  98 101  CO2 23 26  --  28 28  GLUCOSE 132* 101*  --  125* 121*  BUN 13 15  --  8 6  CREATININE 0.99 0.87 0.86 0.71 0.62  CALCIUM 10.1 9.4  --  9.1 9.2   Liver Function Tests:  Recent Labs Lab 12/02/12 1735 12/06/12 1010  AST 23 17  ALT 27 18  ALKPHOS 169* 85  BILITOT 0.5 0.4  PROT 7.5 5.8*  ALBUMIN 3.6 2.2*    Recent Labs Lab 12/02/12 1735  LIPASE 19   No results found for this basename: AMMONIA,  in the last 168 hours CBC:  Recent Labs Lab 12/02/12 1735 12/03/12 0710 12/04/12 0530 12/05/12 0545  WBC 14.7* 10.8* 9.1 11.9*  NEUTROABS 10.3*  --   --   --   HGB 15.2* 13.2 13.4 12.1  HCT 45.5 41.3 42.9 38.5  MCV 90.1 92.2 92.7 90.2  PLT PLATELET CLUMPS NOTED ON SMEAR, UNABLE TO ESTIMATE 266 262 246   Cardiac Enzymes: No results found for this basename: CKTOTAL, CKMB, CKMBINDEX, TROPONINI,  in the last 168 hours BNP: BNP (last 3 results)  Recent Labs  11/29/12 0904  PROBNP 141.2*   CBG: No results found for this basename: GLUCAP,  in the last 168 hours     Signed:  Penny Pia  [facility] 12/06/2012, 1:15  PM

## 2012-12-06 NOTE — Progress Notes (Signed)
Discharge instructions explained to pt and her daughter. Prescription given to pt. Pt told to call Dr. Donell Beers in the morning to find out when she can go back to work. 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 Temp-98.6.

## 2012-12-06 NOTE — Progress Notes (Signed)
Daughter at bedside. Pt's Temp-98.6.

## 2012-12-07 ENCOUNTER — Encounter (HOSPITAL_COMMUNITY): Payer: Self-pay | Admitting: General Surgery

## 2012-12-07 NOTE — Anesthesia Postprocedure Evaluation (Deleted)
  Anesthesia Post-op Note  Patient: Leslie Dean  Procedure(s) Performed: Procedure(s) (LRB): LAPAROSCOPIC Liver Cyst Unroofing and removal of gallbladder (N/A)  Patient Location: PACU  Anesthesia Type: General  Level of Consciousness: awake and alert   Airway and Oxygen Therapy: Patient Spontanous Breathing  Post-op Pain: mild  Post-op Assessment: Post-op Vital signs reviewed, Patient's Cardiovascular Status Stable, Respiratory Function Stable, Patent Airway and No signs of Nausea or vomiting  Last Vitals:  Filed Vitals:   12/06/12 1434  BP: 112/67  Pulse: 77  Temp: 37.1 C  Resp: 20    Post-op Vital Signs: stable   Complications: No apparent anesthesia complications

## 2012-12-08 LAB — BODY FLUID CULTURE
Culture: NO GROWTH
Gram Stain: NONE SEEN

## 2012-12-09 ENCOUNTER — Ambulatory Visit (INDEPENDENT_AMBULATORY_CARE_PROVIDER_SITE_OTHER): Payer: Self-pay | Admitting: General Surgery

## 2012-12-09 LAB — CULTURE, BLOOD (ROUTINE X 2)
Culture: NO GROWTH
Culture: NO GROWTH

## 2012-12-11 LAB — CULTURE, BLOOD (ROUTINE X 2)
Culture: NO GROWTH
Culture: NO GROWTH

## 2012-12-22 ENCOUNTER — Encounter (INDEPENDENT_AMBULATORY_CARE_PROVIDER_SITE_OTHER): Payer: Self-pay | Admitting: General Surgery

## 2012-12-22 ENCOUNTER — Ambulatory Visit (INDEPENDENT_AMBULATORY_CARE_PROVIDER_SITE_OTHER): Payer: Commercial Managed Care - PPO | Admitting: General Surgery

## 2012-12-22 VITALS — BP 110/68 | HR 66 | Temp 99.2°F | Resp 15 | Ht 67.0 in | Wt 191.8 lb

## 2012-12-22 DIAGNOSIS — K7689 Other specified diseases of liver: Secondary | ICD-10-CM

## 2012-12-22 NOTE — Patient Instructions (Signed)
Follow up as needed.  Call for questions or if you need a return visit. Gradually increase activities.

## 2012-12-22 NOTE — Progress Notes (Signed)
HISTORY: Pt is 2 weeks s/p unroofing of gigantic right liver cyst and cholecystectomy.  Other than still fatiguing early, she is doing well.  She takes an occasional dose of tylenol.  She denies fevers/ chills.  She has not had drainage of her incisions.  She is eating OK without nausea or vomiting.      EXAM: General:  Alert and oriented.   Incision:  Small stitch fragment from LUQ incision.  This is removed.     PATHOLOGY: Benign liver cyst and chronic cholecystitis.  ASSESSMENT AND PLAN:   Hepatic cyst No evidence of surgical complications.  Follow up as needed.  Return to work 7/26 without restrictions.          Maudry Diego, MD Surgical Oncology, General & Endocrine Surgery [facility]  No PCP Per Patient No ref. provider found

## 2012-12-22 NOTE — Assessment & Plan Note (Signed)
No evidence of surgical complications.  Follow up as needed.  Return to work 7/26 without restrictions.

## 2013-01-13 ENCOUNTER — Other Ambulatory Visit: Payer: Self-pay

## 2013-04-15 ENCOUNTER — Other Ambulatory Visit: Payer: Self-pay

## 2014-03-25 ENCOUNTER — Other Ambulatory Visit: Payer: Self-pay

## 2014-12-05 ENCOUNTER — Other Ambulatory Visit: Payer: Self-pay

## 2016-09-10 DIAGNOSIS — Q8740 Marfan's syndrome, unspecified: Secondary | ICD-10-CM | POA: Diagnosis not present

## 2016-09-10 DIAGNOSIS — E785 Hyperlipidemia, unspecified: Secondary | ICD-10-CM | POA: Diagnosis not present

## 2016-09-10 DIAGNOSIS — I10 Essential (primary) hypertension: Secondary | ICD-10-CM | POA: Diagnosis not present

## 2016-09-10 DIAGNOSIS — I351 Nonrheumatic aortic (valve) insufficiency: Secondary | ICD-10-CM | POA: Diagnosis not present

## 2016-11-26 ENCOUNTER — Other Ambulatory Visit: Payer: Self-pay | Admitting: *Deleted

## 2016-11-26 ENCOUNTER — Encounter: Payer: Self-pay | Admitting: *Deleted

## 2016-11-26 NOTE — Patient Outreach (Signed)
[facility] Screening call  Made, however, Mrs. Leslie Dean did not wish to participate in the screening. I told her I would send her a letter so she would know more about our services. She was appreciative of this.  Zara Councilarroll C. Burgess EstelleSpinks, MSN, [facility] HospitalGNP-BC Gerontological Nurse Practitioner [facility] [phone]

## 2017-01-01 DIAGNOSIS — M1990 Unspecified osteoarthritis, unspecified site: Secondary | ICD-10-CM | POA: Diagnosis not present

## 2017-01-01 DIAGNOSIS — E785 Hyperlipidemia, unspecified: Secondary | ICD-10-CM | POA: Diagnosis not present

## 2017-01-01 DIAGNOSIS — Z6836 Body mass index (BMI) 36.0-36.9, adult: Secondary | ICD-10-CM | POA: Diagnosis not present

## 2017-01-01 DIAGNOSIS — I351 Nonrheumatic aortic (valve) insufficiency: Secondary | ICD-10-CM | POA: Diagnosis not present

## 2017-01-01 DIAGNOSIS — Q8740 Marfan's syndrome, unspecified: Secondary | ICD-10-CM | POA: Diagnosis not present

## 2017-01-01 DIAGNOSIS — I10 Essential (primary) hypertension: Secondary | ICD-10-CM | POA: Diagnosis not present

## 2017-01-01 DIAGNOSIS — M4800 Spinal stenosis, site unspecified: Secondary | ICD-10-CM | POA: Diagnosis not present

## 2017-01-01 DIAGNOSIS — E6609 Other obesity due to excess calories: Secondary | ICD-10-CM | POA: Diagnosis not present

## 2017-02-26 DIAGNOSIS — M4120 Other idiopathic scoliosis, site unspecified: Secondary | ICD-10-CM | POA: Diagnosis not present

## 2017-03-07 DIAGNOSIS — M546 Pain in thoracic spine: Secondary | ICD-10-CM | POA: Diagnosis not present

## 2017-04-10 DIAGNOSIS — Z716 Tobacco abuse counseling: Secondary | ICD-10-CM | POA: Diagnosis not present

## 2017-04-10 DIAGNOSIS — Q8740 Marfan's syndrome, unspecified: Secondary | ICD-10-CM | POA: Diagnosis not present

## 2017-04-10 DIAGNOSIS — Z6836 Body mass index (BMI) 36.0-36.9, adult: Secondary | ICD-10-CM | POA: Diagnosis not present

## 2017-04-10 DIAGNOSIS — M4800 Spinal stenosis, site unspecified: Secondary | ICD-10-CM | POA: Diagnosis not present

## 2017-04-10 DIAGNOSIS — I351 Nonrheumatic aortic (valve) insufficiency: Secondary | ICD-10-CM | POA: Diagnosis not present

## 2017-04-10 DIAGNOSIS — E6609 Other obesity due to excess calories: Secondary | ICD-10-CM | POA: Diagnosis not present

## 2017-04-10 DIAGNOSIS — F17290 Nicotine dependence, other tobacco product, uncomplicated: Secondary | ICD-10-CM | POA: Diagnosis not present

## 2017-04-10 DIAGNOSIS — M1990 Unspecified osteoarthritis, unspecified site: Secondary | ICD-10-CM | POA: Diagnosis not present

## 2017-04-10 DIAGNOSIS — E785 Hyperlipidemia, unspecified: Secondary | ICD-10-CM | POA: Diagnosis not present

## 2017-04-10 DIAGNOSIS — Z720 Tobacco use: Secondary | ICD-10-CM | POA: Diagnosis not present

## 2017-04-10 DIAGNOSIS — I10 Essential (primary) hypertension: Secondary | ICD-10-CM | POA: Diagnosis not present

## 2017-04-11 DIAGNOSIS — E785 Hyperlipidemia, unspecified: Secondary | ICD-10-CM | POA: Diagnosis not present

## 2017-04-11 DIAGNOSIS — I10 Essential (primary) hypertension: Secondary | ICD-10-CM | POA: Diagnosis not present

## 2017-07-04 DIAGNOSIS — I10 Essential (primary) hypertension: Secondary | ICD-10-CM | POA: Diagnosis not present

## 2017-07-04 DIAGNOSIS — Q8740 Marfan's syndrome, unspecified: Secondary | ICD-10-CM | POA: Diagnosis not present

## 2017-07-04 DIAGNOSIS — M1990 Unspecified osteoarthritis, unspecified site: Secondary | ICD-10-CM | POA: Diagnosis not present

## 2017-07-04 DIAGNOSIS — E785 Hyperlipidemia, unspecified: Secondary | ICD-10-CM | POA: Diagnosis not present

## 2017-07-04 DIAGNOSIS — I351 Nonrheumatic aortic (valve) insufficiency: Secondary | ICD-10-CM | POA: Diagnosis not present

## 2017-07-04 DIAGNOSIS — M4800 Spinal stenosis, site unspecified: Secondary | ICD-10-CM | POA: Diagnosis not present

## 2017-07-04 DIAGNOSIS — Z716 Tobacco abuse counseling: Secondary | ICD-10-CM | POA: Diagnosis not present

## 2017-07-04 DIAGNOSIS — F17290 Nicotine dependence, other tobacco product, uncomplicated: Secondary | ICD-10-CM | POA: Diagnosis not present

## 2017-07-04 DIAGNOSIS — Z720 Tobacco use: Secondary | ICD-10-CM | POA: Diagnosis not present

## 2018-01-20 DIAGNOSIS — F17290 Nicotine dependence, other tobacco product, uncomplicated: Secondary | ICD-10-CM | POA: Diagnosis not present

## 2018-01-20 DIAGNOSIS — I351 Nonrheumatic aortic (valve) insufficiency: Secondary | ICD-10-CM | POA: Diagnosis not present

## 2018-01-20 DIAGNOSIS — Q8740 Marfan's syndrome, unspecified: Secondary | ICD-10-CM | POA: Diagnosis not present

## 2018-01-20 DIAGNOSIS — M1990 Unspecified osteoarthritis, unspecified site: Secondary | ICD-10-CM | POA: Diagnosis not present

## 2018-01-20 DIAGNOSIS — I10 Essential (primary) hypertension: Secondary | ICD-10-CM | POA: Diagnosis not present

## 2018-01-20 DIAGNOSIS — M4800 Spinal stenosis, site unspecified: Secondary | ICD-10-CM | POA: Diagnosis not present

## 2018-01-20 DIAGNOSIS — E785 Hyperlipidemia, unspecified: Secondary | ICD-10-CM | POA: Diagnosis not present

## 2018-01-22 DIAGNOSIS — I10 Essential (primary) hypertension: Secondary | ICD-10-CM | POA: Diagnosis not present

## 2018-01-22 DIAGNOSIS — R7303 Prediabetes: Secondary | ICD-10-CM | POA: Diagnosis not present

## 2018-01-22 DIAGNOSIS — E785 Hyperlipidemia, unspecified: Secondary | ICD-10-CM | POA: Diagnosis not present

## 2018-04-13 DIAGNOSIS — N3946 Mixed incontinence: Secondary | ICD-10-CM | POA: Diagnosis not present

## 2018-04-13 DIAGNOSIS — N952 Postmenopausal atrophic vaginitis: Secondary | ICD-10-CM | POA: Diagnosis not present

## 2018-05-25 DIAGNOSIS — N393 Stress incontinence (female) (male): Secondary | ICD-10-CM | POA: Diagnosis not present

## 2018-05-25 DIAGNOSIS — N3946 Mixed incontinence: Secondary | ICD-10-CM | POA: Diagnosis not present

## 2018-05-25 DIAGNOSIS — N949 Unspecified condition associated with female genital organs and menstrual cycle: Secondary | ICD-10-CM | POA: Diagnosis not present

## 2018-05-25 DIAGNOSIS — Z09 Encounter for follow-up examination after completed treatment for conditions other than malignant neoplasm: Secondary | ICD-10-CM | POA: Diagnosis not present

## 2018-05-25 DIAGNOSIS — N952 Postmenopausal atrophic vaginitis: Secondary | ICD-10-CM | POA: Diagnosis not present

## 2018-05-26 DIAGNOSIS — E785 Hyperlipidemia, unspecified: Secondary | ICD-10-CM | POA: Diagnosis not present

## 2018-05-26 DIAGNOSIS — Q8740 Marfan's syndrome, unspecified: Secondary | ICD-10-CM | POA: Diagnosis not present

## 2018-05-26 DIAGNOSIS — M4800 Spinal stenosis, site unspecified: Secondary | ICD-10-CM | POA: Diagnosis not present

## 2018-05-26 DIAGNOSIS — E669 Obesity, unspecified: Secondary | ICD-10-CM | POA: Diagnosis not present

## 2018-05-26 DIAGNOSIS — M1990 Unspecified osteoarthritis, unspecified site: Secondary | ICD-10-CM | POA: Diagnosis not present

## 2018-05-26 DIAGNOSIS — I10 Essential (primary) hypertension: Secondary | ICD-10-CM | POA: Diagnosis not present

## 2018-07-13 DIAGNOSIS — Z4689 Encounter for fitting and adjustment of other specified devices: Secondary | ICD-10-CM | POA: Diagnosis not present

## 2018-07-13 DIAGNOSIS — Z09 Encounter for follow-up examination after completed treatment for conditions other than malignant neoplasm: Secondary | ICD-10-CM | POA: Diagnosis not present

## 2018-07-13 DIAGNOSIS — N393 Stress incontinence (female) (male): Secondary | ICD-10-CM | POA: Diagnosis not present

## 2018-07-13 DIAGNOSIS — N949 Unspecified condition associated with female genital organs and menstrual cycle: Secondary | ICD-10-CM | POA: Diagnosis not present

## 2018-07-21 DIAGNOSIS — H43812 Vitreous degeneration, left eye: Secondary | ICD-10-CM | POA: Diagnosis not present

## 2018-07-21 DIAGNOSIS — H27132 Posterior dislocation of lens, left eye: Secondary | ICD-10-CM | POA: Diagnosis not present

## 2018-07-21 DIAGNOSIS — H25812 Combined forms of age-related cataract, left eye: Secondary | ICD-10-CM | POA: Diagnosis not present

## 2018-08-03 DIAGNOSIS — N393 Stress incontinence (female) (male): Secondary | ICD-10-CM | POA: Diagnosis not present

## 2018-08-03 DIAGNOSIS — N39490 Overflow incontinence: Secondary | ICD-10-CM | POA: Diagnosis not present

## 2018-08-10 DIAGNOSIS — N3000 Acute cystitis without hematuria: Secondary | ICD-10-CM | POA: Diagnosis not present

## 2019-02-23 DIAGNOSIS — R7303 Prediabetes: Secondary | ICD-10-CM | POA: Diagnosis not present

## 2019-02-23 DIAGNOSIS — I10 Essential (primary) hypertension: Secondary | ICD-10-CM | POA: Diagnosis not present

## 2019-02-23 DIAGNOSIS — M1990 Unspecified osteoarthritis, unspecified site: Secondary | ICD-10-CM | POA: Diagnosis not present

## 2019-02-23 DIAGNOSIS — M4800 Spinal stenosis, site unspecified: Secondary | ICD-10-CM | POA: Diagnosis not present

## 2019-02-23 DIAGNOSIS — Q8740 Marfan's syndrome, unspecified: Secondary | ICD-10-CM | POA: Diagnosis not present

## 2019-02-23 DIAGNOSIS — E785 Hyperlipidemia, unspecified: Secondary | ICD-10-CM | POA: Diagnosis not present

## 2019-02-25 DIAGNOSIS — I10 Essential (primary) hypertension: Secondary | ICD-10-CM | POA: Diagnosis not present

## 2019-02-25 DIAGNOSIS — R7303 Prediabetes: Secondary | ICD-10-CM | POA: Diagnosis not present

## 2019-02-25 DIAGNOSIS — E785 Hyperlipidemia, unspecified: Secondary | ICD-10-CM | POA: Diagnosis not present

## 2019-06-18 DIAGNOSIS — E785 Hyperlipidemia, unspecified: Secondary | ICD-10-CM | POA: Diagnosis not present

## 2019-06-18 DIAGNOSIS — I10 Essential (primary) hypertension: Secondary | ICD-10-CM | POA: Diagnosis not present

## 2019-06-18 DIAGNOSIS — Q8740 Marfan's syndrome, unspecified: Secondary | ICD-10-CM | POA: Diagnosis not present

## 2019-06-18 DIAGNOSIS — M1990 Unspecified osteoarthritis, unspecified site: Secondary | ICD-10-CM | POA: Diagnosis not present

## 2019-06-18 DIAGNOSIS — R7303 Prediabetes: Secondary | ICD-10-CM | POA: Diagnosis not present

## 2019-06-18 DIAGNOSIS — M4800 Spinal stenosis, site unspecified: Secondary | ICD-10-CM | POA: Diagnosis not present

## 2019-07-25 ENCOUNTER — Ambulatory Visit: Payer: 59 | Attending: Internal Medicine

## 2019-09-22 DIAGNOSIS — H5201 Hypermetropia, right eye: Secondary | ICD-10-CM | POA: Diagnosis not present

## 2019-09-22 DIAGNOSIS — H10412 Chronic giant papillary conjunctivitis, left eye: Secondary | ICD-10-CM | POA: Diagnosis not present

## 2019-10-01 DIAGNOSIS — Q8740 Marfan's syndrome, unspecified: Secondary | ICD-10-CM | POA: Diagnosis not present

## 2019-10-01 DIAGNOSIS — M1990 Unspecified osteoarthritis, unspecified site: Secondary | ICD-10-CM | POA: Diagnosis not present

## 2019-10-01 DIAGNOSIS — I10 Essential (primary) hypertension: Secondary | ICD-10-CM | POA: Diagnosis not present

## 2019-10-01 DIAGNOSIS — M4800 Spinal stenosis, site unspecified: Secondary | ICD-10-CM | POA: Diagnosis not present

## 2019-10-01 DIAGNOSIS — E785 Hyperlipidemia, unspecified: Secondary | ICD-10-CM | POA: Diagnosis not present

## 2019-10-01 DIAGNOSIS — R7303 Prediabetes: Secondary | ICD-10-CM | POA: Diagnosis not present

## 2019-10-13 DIAGNOSIS — H04212 Epiphora due to excess lacrimation, left lacrimal gland: Secondary | ICD-10-CM | POA: Diagnosis not present

## 2019-10-13 DIAGNOSIS — H16223 Keratoconjunctivitis sicca, not specified as Sjogren's, bilateral: Secondary | ICD-10-CM | POA: Diagnosis not present

## 2019-10-18 ENCOUNTER — Inpatient Hospital Stay (HOSPITAL_COMMUNITY): Payer: PPO

## 2019-10-18 ENCOUNTER — Emergency Department (HOSPITAL_COMMUNITY): Payer: PPO

## 2019-10-18 ENCOUNTER — Inpatient Hospital Stay (HOSPITAL_COMMUNITY)
Admission: EM | Admit: 2019-10-18 | Discharge: 2019-10-30 | DRG: 480 | Disposition: A | Discharge status: Skilled Nursing Facility | Payer: PPO | Attending: Family Medicine | Admitting: Family Medicine

## 2019-10-18 ENCOUNTER — Inpatient Hospital Stay (HOSPITAL_COMMUNITY): Payer: PPO | Admitting: Certified Registered Nurse Anesthetist

## 2019-10-18 ENCOUNTER — Encounter (HOSPITAL_COMMUNITY)
Admission: EM | Disposition: A | Discharge status: Skilled Nursing Facility | Payer: Self-pay | Source: Home / Self Care | Attending: Internal Medicine

## 2019-10-18 ENCOUNTER — Encounter (HOSPITAL_COMMUNITY): Payer: Self-pay | Admitting: Emergency Medicine

## 2019-10-18 ENCOUNTER — Other Ambulatory Visit: Payer: Self-pay

## 2019-10-18 DIAGNOSIS — M25561 Pain in right knee: Secondary | ICD-10-CM | POA: Diagnosis not present

## 2019-10-18 DIAGNOSIS — M25461 Effusion, right knee: Secondary | ICD-10-CM | POA: Diagnosis not present

## 2019-10-18 DIAGNOSIS — I10 Essential (primary) hypertension: Secondary | ICD-10-CM | POA: Diagnosis present

## 2019-10-18 DIAGNOSIS — Q8740 Marfan's syndrome, unspecified: Secondary | ICD-10-CM

## 2019-10-18 DIAGNOSIS — M79662 Pain in left lower leg: Secondary | ICD-10-CM | POA: Diagnosis not present

## 2019-10-18 DIAGNOSIS — W19XXXA Unspecified fall, initial encounter: Secondary | ICD-10-CM

## 2019-10-18 DIAGNOSIS — K219 Gastro-esophageal reflux disease without esophagitis: Secondary | ICD-10-CM | POA: Diagnosis not present

## 2019-10-18 DIAGNOSIS — D509 Iron deficiency anemia, unspecified: Secondary | ICD-10-CM | POA: Diagnosis not present

## 2019-10-18 DIAGNOSIS — R32 Unspecified urinary incontinence: Secondary | ICD-10-CM | POA: Diagnosis present

## 2019-10-18 DIAGNOSIS — S72142A Displaced intertrochanteric fracture of left femur, initial encounter for closed fracture: Secondary | ICD-10-CM | POA: Diagnosis not present

## 2019-10-18 DIAGNOSIS — R2681 Unsteadiness on feet: Secondary | ICD-10-CM | POA: Diagnosis not present

## 2019-10-18 DIAGNOSIS — S2239XA Fracture of one rib, unspecified side, initial encounter for closed fracture: Secondary | ICD-10-CM | POA: Diagnosis present

## 2019-10-18 DIAGNOSIS — M6281 Muscle weakness (generalized): Secondary | ICD-10-CM | POA: Diagnosis not present

## 2019-10-18 DIAGNOSIS — Z79899 Other long term (current) drug therapy: Secondary | ICD-10-CM

## 2019-10-18 DIAGNOSIS — Z01818 Encounter for other preprocedural examination: Secondary | ICD-10-CM | POA: Diagnosis not present

## 2019-10-18 DIAGNOSIS — S2232XB Fracture of one rib, left side, initial encounter for open fracture: Secondary | ICD-10-CM | POA: Diagnosis not present

## 2019-10-18 DIAGNOSIS — T40605A Adverse effect of unspecified narcotics, initial encounter: Secondary | ICD-10-CM | POA: Diagnosis present

## 2019-10-18 DIAGNOSIS — J9600 Acute respiratory failure, unspecified whether with hypoxia or hypercapnia: Secondary | ICD-10-CM | POA: Diagnosis present

## 2019-10-18 DIAGNOSIS — Z7982 Long term (current) use of aspirin: Secondary | ICD-10-CM

## 2019-10-18 DIAGNOSIS — F17210 Nicotine dependence, cigarettes, uncomplicated: Secondary | ICD-10-CM | POA: Diagnosis not present

## 2019-10-18 DIAGNOSIS — M5489 Other dorsalgia: Secondary | ICD-10-CM | POA: Diagnosis not present

## 2019-10-18 DIAGNOSIS — M419 Scoliosis, unspecified: Secondary | ICD-10-CM | POA: Diagnosis not present

## 2019-10-18 DIAGNOSIS — I4510 Unspecified right bundle-branch block: Secondary | ICD-10-CM | POA: Diagnosis present

## 2019-10-18 DIAGNOSIS — M810 Age-related osteoporosis without current pathological fracture: Secondary | ICD-10-CM | POA: Diagnosis not present

## 2019-10-18 DIAGNOSIS — W06XXXA Fall from bed, initial encounter: Secondary | ICD-10-CM | POA: Diagnosis present

## 2019-10-18 DIAGNOSIS — M2540 Effusion, unspecified joint: Secondary | ICD-10-CM

## 2019-10-18 DIAGNOSIS — Z20822 Contact with and (suspected) exposure to covid-19: Secondary | ICD-10-CM | POA: Diagnosis present

## 2019-10-18 DIAGNOSIS — S3992XA Unspecified injury of lower back, initial encounter: Secondary | ICD-10-CM | POA: Diagnosis not present

## 2019-10-18 DIAGNOSIS — M4800 Spinal stenosis, site unspecified: Secondary | ICD-10-CM | POA: Diagnosis not present

## 2019-10-18 DIAGNOSIS — K5900 Constipation, unspecified: Secondary | ICD-10-CM | POA: Diagnosis not present

## 2019-10-18 DIAGNOSIS — G8929 Other chronic pain: Secondary | ICD-10-CM | POA: Diagnosis present

## 2019-10-18 DIAGNOSIS — R52 Pain, unspecified: Secondary | ICD-10-CM

## 2019-10-18 DIAGNOSIS — J9601 Acute respiratory failure with hypoxia: Secondary | ICD-10-CM

## 2019-10-18 DIAGNOSIS — S72002A Fracture of unspecified part of neck of left femur, initial encounter for closed fracture: Secondary | ICD-10-CM | POA: Diagnosis not present

## 2019-10-18 DIAGNOSIS — S7292XD Unspecified fracture of left femur, subsequent encounter for closed fracture with routine healing: Secondary | ICD-10-CM | POA: Diagnosis not present

## 2019-10-18 DIAGNOSIS — N390 Urinary tract infection, site not specified: Secondary | ICD-10-CM | POA: Diagnosis present

## 2019-10-18 DIAGNOSIS — I959 Hypotension, unspecified: Secondary | ICD-10-CM | POA: Diagnosis not present

## 2019-10-18 DIAGNOSIS — L8990 Pressure ulcer of unspecified site, unspecified stage: Secondary | ICD-10-CM | POA: Insufficient documentation

## 2019-10-18 DIAGNOSIS — M25552 Pain in left hip: Secondary | ICD-10-CM | POA: Diagnosis not present

## 2019-10-18 DIAGNOSIS — E785 Hyperlipidemia, unspecified: Secondary | ICD-10-CM | POA: Diagnosis not present

## 2019-10-18 DIAGNOSIS — Z419 Encounter for procedure for purposes other than remedying health state, unspecified: Secondary | ICD-10-CM

## 2019-10-18 DIAGNOSIS — B9620 Unspecified Escherichia coli [E. coli] as the cause of diseases classified elsewhere: Secondary | ICD-10-CM | POA: Diagnosis not present

## 2019-10-18 DIAGNOSIS — S2232XD Fracture of one rib, left side, subsequent encounter for fracture with routine healing: Secondary | ICD-10-CM | POA: Diagnosis not present

## 2019-10-18 DIAGNOSIS — Z8279 Family history of other congenital malformations, deformations and chromosomal abnormalities: Secondary | ICD-10-CM

## 2019-10-18 DIAGNOSIS — R0902 Hypoxemia: Secondary | ICD-10-CM | POA: Diagnosis not present

## 2019-10-18 DIAGNOSIS — J40 Bronchitis, not specified as acute or chronic: Secondary | ICD-10-CM | POA: Diagnosis not present

## 2019-10-18 DIAGNOSIS — R531 Weakness: Secondary | ICD-10-CM | POA: Diagnosis not present

## 2019-10-18 DIAGNOSIS — M2550 Pain in unspecified joint: Secondary | ICD-10-CM | POA: Diagnosis not present

## 2019-10-18 DIAGNOSIS — Z7401 Bed confinement status: Secondary | ICD-10-CM | POA: Diagnosis not present

## 2019-10-18 DIAGNOSIS — Z03818 Encounter for observation for suspected exposure to other biological agents ruled out: Secondary | ICD-10-CM | POA: Diagnosis not present

## 2019-10-18 DIAGNOSIS — Y92003 Bedroom of unspecified non-institutional (private) residence as the place of occurrence of the external cause: Secondary | ICD-10-CM

## 2019-10-18 DIAGNOSIS — J201 Acute bronchitis due to Hemophilus influenzae: Secondary | ICD-10-CM | POA: Diagnosis not present

## 2019-10-18 HISTORY — PX: FEMUR IM NAIL: SHX1597

## 2019-10-18 LAB — ALBUMIN: Albumin: 3.4 g/dL — ABNORMAL LOW (ref 3.5–5.0)

## 2019-10-18 LAB — CBC WITH DIFFERENTIAL/PLATELET
Abs Immature Granulocytes: 0.09 10*3/uL — ABNORMAL HIGH (ref 0.00–0.07)
Basophils Absolute: 0.1 10*3/uL (ref 0.0–0.1)
Basophils Relative: 0 %
Eosinophils Absolute: 0 10*3/uL (ref 0.0–0.5)
Eosinophils Relative: 0 %
HCT: 44.5 % (ref 36.0–46.0)
Hemoglobin: 13.9 g/dL (ref 12.0–15.0)
Immature Granulocytes: 1 %
Lymphocytes Relative: 6 %
Lymphs Abs: 1.3 10*3/uL (ref 0.7–4.0)
MCH: 29.4 pg (ref 26.0–34.0)
MCHC: 31.2 g/dL (ref 30.0–36.0)
MCV: 94.3 fL (ref 80.0–100.0)
Monocytes Absolute: 1 10*3/uL (ref 0.1–1.0)
Monocytes Relative: 5 %
Neutro Abs: 17.4 10*3/uL — ABNORMAL HIGH (ref 1.7–7.7)
Neutrophils Relative %: 88 %
Platelets: 218 10*3/uL (ref 150–400)
RBC: 4.72 MIL/uL (ref 3.87–5.11)
RDW: 14.9 % (ref 11.5–15.5)
WBC: 19.7 10*3/uL — ABNORMAL HIGH (ref 4.0–10.5)
nRBC: 0 % (ref 0.0–0.2)

## 2019-10-18 LAB — URINALYSIS, ROUTINE W REFLEX MICROSCOPIC
Bilirubin Urine: NEGATIVE
Glucose, UA: NEGATIVE mg/dL
Hgb urine dipstick: NEGATIVE
Ketones, ur: NEGATIVE mg/dL
Nitrite: POSITIVE — AB
Protein, ur: NEGATIVE mg/dL
Specific Gravity, Urine: 1.015 (ref 1.005–1.030)
pH: 5 (ref 5.0–8.0)

## 2019-10-18 LAB — BASIC METABOLIC PANEL
Anion gap: 10 (ref 5–15)
BUN: 16 mg/dL (ref 8–23)
CO2: 26 mmol/L (ref 22–32)
Calcium: 9.3 mg/dL (ref 8.9–10.3)
Chloride: 105 mmol/L (ref 98–111)
Creatinine, Ser: 0.9 mg/dL (ref 0.44–1.00)
GFR calc Af Amer: >60 mL/min (ref >60)
GFR calc non Af Amer: >60 mL/min (ref >60)
Glucose, Bld: 156 mg/dL — ABNORMAL HIGH (ref 70–99)
Potassium: 4.3 mmol/L (ref 3.5–5.1)
Sodium: 141 mmol/L (ref 135–145)

## 2019-10-18 LAB — SARS CORONAVIRUS 2 BY RT PCR (HOSPITAL ORDER, PERFORMED IN ~~LOC~~ HOSPITAL LAB): SARS Coronavirus 2: NEGATIVE

## 2019-10-18 LAB — BRAIN NATRIURETIC PEPTIDE: B Natriuretic Peptide: 175.4 pg/mL — ABNORMAL HIGH (ref 0.0–100.0)

## 2019-10-18 LAB — CK: Total CK: 230 U/L (ref 38–234)

## 2019-10-18 SURGERY — INSERTION, INTRAMEDULLARY ROD, FEMUR
Anesthesia: General | Laterality: Left

## 2019-10-18 MED ORDER — METHOCARBAMOL 500 MG PO TABS: 500.0000 mg | ORAL_TABLET | Freq: Four times a day (QID) | ORAL | Status: DC | PRN

## 2019-10-18 MED ORDER — FENTANYL CITRATE (PF) 250 MCG/5ML IJ SOLN
INTRAMUSCULAR | Status: AC
  Filled 2019-10-18: qty 5

## 2019-10-18 MED ORDER — CEFAZOLIN SODIUM-DEXTROSE 2-4 GM/100ML-% IV SOLN
2.0000 g | Freq: Four times a day (QID) | INTRAVENOUS | Status: AC
  Administered 2019-10-19 (×2): 2 g via INTRAVENOUS
  Filled 2019-10-18 (×2): qty 100

## 2019-10-18 MED ORDER — LIDOCAINE 2% (20 MG/ML) 5 ML SYRINGE
INTRAMUSCULAR | Status: DC | PRN
  Administered 2019-10-18: 60 mg via INTRAVENOUS
  Administered 2019-10-18: 40 mg via INTRAVENOUS

## 2019-10-18 MED ORDER — MUPIROCIN 2 % EX OINT: 1.0000 "application " | TOPICAL_OINTMENT | Freq: Two times a day (BID) | CUTANEOUS | Status: DC

## 2019-10-18 MED ORDER — SODIUM CHLORIDE 0.9 % IV SOLN: Freq: Once | INTRAVENOUS | Status: AC

## 2019-10-18 MED ORDER — TRANEXAMIC ACID-NACL 1000-0.7 MG/100ML-% IV SOLN
1000.0000 mg | INTRAVENOUS | Status: DC
  Filled 2019-10-18: qty 100

## 2019-10-18 MED ORDER — FENTANYL CITRATE (PF) 100 MCG/2ML IJ SOLN
INTRAMUSCULAR | Status: AC
  Filled 2019-10-18: qty 2

## 2019-10-18 MED ORDER — MIDAZOLAM HCL 2 MG/2ML IJ SOLN
INTRAMUSCULAR | Status: AC
  Filled 2019-10-18: qty 2

## 2019-10-18 MED ORDER — MIDAZOLAM HCL 2 MG/2ML IJ SOLN
INTRAMUSCULAR | Status: DC | PRN
  Administered 2019-10-18: 1 mg via INTRAVENOUS

## 2019-10-18 MED ORDER — PROPOFOL 10 MG/ML IV BOLUS
INTRAVENOUS | Status: AC
  Filled 2019-10-18: qty 20

## 2019-10-18 MED ORDER — MAGNESIUM OXIDE 400 (241.3 MG) MG PO TABS
400.0000 mg | ORAL_TABLET | Freq: Every day | ORAL | Status: DC
  Administered 2019-10-19 – 2019-10-30 (×12): 400 mg via ORAL
  Filled 2019-10-18 (×12): qty 1

## 2019-10-18 MED ORDER — CEFAZOLIN SODIUM-DEXTROSE 2-3 GM-%(50ML) IV SOLR
INTRAVENOUS | Status: DC | PRN
Start: 2019-10-18 — End: 2019-10-18
  Administered 2019-10-18: 2 g via INTRAVENOUS

## 2019-10-18 MED ORDER — ONDANSETRON HCL 4 MG/2ML IJ SOLN
INTRAMUSCULAR | Status: DC | PRN
  Administered 2019-10-18: 4 mg via INTRAVENOUS

## 2019-10-18 MED ORDER — FENTANYL CITRATE (PF) 250 MCG/5ML IJ SOLN
INTRAMUSCULAR | Status: DC | PRN
  Administered 2019-10-18 (×2): 50 ug via INTRAVENOUS

## 2019-10-18 MED ORDER — METHOCARBAMOL 500 MG PO TABS
500.0000 mg | ORAL_TABLET | Freq: Four times a day (QID) | ORAL | Status: DC | PRN
  Administered 2019-10-19 – 2019-10-20 (×2): 500 mg via ORAL
  Filled 2019-10-18 (×2): qty 1

## 2019-10-18 MED ORDER — MORPHINE SULFATE (PF) 2 MG/ML IV SOLN: 2.0000 mg | INTRAVENOUS | Status: DC | PRN

## 2019-10-18 MED ORDER — KETOROLAC TROMETHAMINE 30 MG/ML IJ SOLN
30.0000 mg | Freq: Once | INTRAMUSCULAR | Status: AC
  Administered 2019-10-18: 30 mg via INTRAVENOUS
  Filled 2019-10-18: qty 1

## 2019-10-18 MED ORDER — EPHEDRINE SULFATE-NACL 50-0.9 MG/10ML-% IV SOSY
PREFILLED_SYRINGE | INTRAVENOUS | Status: DC | PRN
  Administered 2019-10-18: 15 mg via INTRAVENOUS

## 2019-10-18 MED ORDER — PHENYLEPHRINE HCL-NACL 10-0.9 MG/250ML-% IV SOLN
INTRAVENOUS | Status: DC | PRN
  Administered 2019-10-18: 50 ug/min via INTRAVENOUS

## 2019-10-18 MED ORDER — DEXTROSE-NACL 5-0.45 % IV SOLN: INTRAVENOUS | Status: DC

## 2019-10-18 MED ORDER — LACTATED RINGERS IV SOLN: INTRAVENOUS | Status: DC | PRN

## 2019-10-18 MED ORDER — CEFAZOLIN SODIUM-DEXTROSE 2-4 GM/100ML-% IV SOLN
INTRAVENOUS | Status: AC
  Filled 2019-10-18: qty 100

## 2019-10-18 MED ORDER — HYDROMORPHONE HCL 1 MG/ML IJ SOLN
1.0000 mg | Freq: Once | INTRAMUSCULAR | Status: AC
  Administered 2019-10-18: 1 mg via INTRAVENOUS
  Filled 2019-10-18: qty 1

## 2019-10-18 MED ORDER — CEFAZOLIN SODIUM-DEXTROSE 2-4 GM/100ML-% IV SOLN
2.0000 g | INTRAVENOUS | Status: DC
  Filled 2019-10-18: qty 100

## 2019-10-18 MED ORDER — METHOCARBAMOL 500 MG IVPB - SIMPLE MED
500.0000 mg | Freq: Four times a day (QID) | INTRAVENOUS | Status: DC | PRN
  Filled 2019-10-18: qty 50

## 2019-10-18 MED ORDER — PROPOFOL 1000 MG/100ML IV EMUL
INTRAVENOUS | Status: AC
  Filled 2019-10-18: qty 100

## 2019-10-18 MED ORDER — 0.9 % SODIUM CHLORIDE (POUR BTL) OPTIME
TOPICAL | Status: DC | PRN
  Administered 2019-10-18: 1000 mL

## 2019-10-18 MED ORDER — CHLORHEXIDINE GLUCONATE 4 % EX LIQD: 60.0000 mL | Freq: Once | CUTANEOUS | Status: DC

## 2019-10-18 MED ORDER — HYDRALAZINE HCL 20 MG/ML IJ SOLN: 10.0000 mg | Freq: Four times a day (QID) | INTRAMUSCULAR | Status: DC | PRN

## 2019-10-18 MED ORDER — METHOCARBAMOL 1000 MG/10ML IJ SOLN
500.0000 mg | Freq: Four times a day (QID) | INTRAVENOUS | Status: DC | PRN
  Filled 2019-10-18: qty 5

## 2019-10-18 MED ORDER — MAGNESIUM OXIDE -MG SUPPLEMENT 400 (240 MG) MG PO TABS
1.0000 | ORAL_TABLET | Freq: Every day | ORAL | Status: DC
  Filled 2019-10-18: qty 1

## 2019-10-18 MED ORDER — POVIDONE-IODINE 10 % EX SWAB: 2.0000 "application " | Freq: Once | CUTANEOUS | Status: DC

## 2019-10-18 MED ORDER — LACTATED RINGERS IV SOLN: INTRAVENOUS | Status: DC

## 2019-10-18 MED ORDER — POLYETHYLENE GLYCOL 3350 17 G PO PACK: 17.0000 g | PACK | Freq: Every day | ORAL | Status: DC | PRN

## 2019-10-18 MED ORDER — TRAZODONE HCL 100 MG PO TABS
300.0000 mg | ORAL_TABLET | Freq: Every day | ORAL | Status: DC
  Administered 2019-10-19 – 2019-10-29 (×9): 300 mg via ORAL
  Filled 2019-10-18 (×10): qty 3

## 2019-10-18 MED ORDER — ONDANSETRON HCL 4 MG/2ML IJ SOLN: 4.0000 mg | Freq: Once | INTRAMUSCULAR | Status: DC | PRN

## 2019-10-18 MED ORDER — PROPOFOL 10 MG/ML IV BOLUS
INTRAVENOUS | Status: DC | PRN
  Administered 2019-10-18 (×9): 20 mg via INTRAVENOUS
  Administered 2019-10-18: 120 mg via INTRAVENOUS
  Administered 2019-10-18 (×5): 20 mg via INTRAVENOUS

## 2019-10-18 MED ORDER — FENTANYL CITRATE (PF) 100 MCG/2ML IJ SOLN: 25.0000 ug | INTRAMUSCULAR | Status: DC | PRN

## 2019-10-18 MED ORDER — TRANEXAMIC ACID-NACL 1000-0.7 MG/100ML-% IV SOLN
INTRAVENOUS | Status: DC | PRN
Start: 2019-10-18 — End: 2019-10-18
  Administered 2019-10-18: 1000 mg via INTRAVENOUS

## 2019-10-18 MED ORDER — MAGNESIUM OXIDE -MG SUPPLEMENT 400 (240 MG) MG PO TABS
500.0000 mg | ORAL_TABLET | Freq: Every day | ORAL | Status: DC
  Filled 2019-10-18: qty 2

## 2019-10-18 MED ORDER — MORPHINE SULFATE (PF) 2 MG/ML IV SOLN
1.0000 mg | INTRAVENOUS | Status: DC | PRN
  Administered 2019-10-18 (×3): 2 mg via INTRAVENOUS
  Filled 2019-10-18 (×3): qty 1

## 2019-10-18 MED ORDER — SODIUM CHLORIDE 0.9 % IV SOLN: INTRAVENOUS | Status: DC

## 2019-10-18 MED ORDER — PHENYLEPHRINE 40 MCG/ML (10ML) SYRINGE FOR IV PUSH (FOR BLOOD PRESSURE SUPPORT)
PREFILLED_SYRINGE | INTRAVENOUS | Status: DC | PRN
  Administered 2019-10-18: 200 ug via INTRAVENOUS

## 2019-10-18 MED ORDER — FENTANYL CITRATE (PF) 100 MCG/2ML IJ SOLN
INTRAMUSCULAR | Status: DC | PRN
  Administered 2019-10-18 (×2): 50 ug via INTRAVENOUS

## 2019-10-18 MED ORDER — MORPHINE SULFATE (PF) 4 MG/ML IV SOLN
4.0000 mg | Freq: Once | INTRAVENOUS | Status: AC
  Administered 2019-10-18: 4 mg via INTRAVENOUS
  Filled 2019-10-18: qty 1

## 2019-10-18 MED ORDER — SUGAMMADEX SODIUM 200 MG/2ML IV SOLN
INTRAVENOUS | Status: DC | PRN
  Administered 2019-10-18: 200 mg via INTRAVENOUS

## 2019-10-18 MED ORDER — ROCURONIUM BROMIDE 10 MG/ML (PF) SYRINGE
PREFILLED_SYRINGE | INTRAVENOUS | Status: DC | PRN
  Administered 2019-10-18: 40 mg via INTRAVENOUS

## 2019-10-18 MED ORDER — HYDROCODONE-ACETAMINOPHEN 5-325 MG PO TABS: 1.0000 | ORAL_TABLET | Freq: Four times a day (QID) | ORAL | Status: DC | PRN

## 2019-10-18 MED ORDER — DOCUSATE SODIUM 100 MG PO CAPS: 100.0000 mg | ORAL_CAPSULE | Freq: Two times a day (BID) | ORAL | Status: DC

## 2019-10-18 MED ORDER — SUCCINYLCHOLINE CHLORIDE 200 MG/10ML IV SOSY
PREFILLED_SYRINGE | INTRAVENOUS | Status: DC | PRN
  Administered 2019-10-18: 140 mg via INTRAVENOUS

## 2019-10-18 MED ORDER — SODIUM CHLORIDE 0.9 % IV SOLN
1.0000 g | Freq: Once | INTRAVENOUS | Status: AC
  Administered 2019-10-18: 1 g via INTRAVENOUS
  Filled 2019-10-18: qty 10

## 2019-10-18 MED ORDER — VITAMIN D 25 MCG (1000 UNIT) PO TABS
2000.0000 [IU] | ORAL_TABLET | Freq: Every day | ORAL | Status: DC
  Administered 2019-10-19 – 2019-10-30 (×12): 2000 [IU] via ORAL
  Filled 2019-10-18 (×13): qty 2

## 2019-10-18 MED ORDER — SODIUM CHLORIDE 0.9 % IV SOLN
1.0000 g | INTRAVENOUS | Status: DC
  Administered 2019-10-19: 1 g via INTRAVENOUS
  Filled 2019-10-18: qty 1

## 2019-10-18 SURGICAL SUPPLY — 40 items
BAG SPEC THK2 15X12 ZIP CLS (MISCELLANEOUS)
BAG ZIPLOCK 12X15 (MISCELLANEOUS) IMPLANT
BIT DRILL 4.3MMS DISTAL GRDTED (BIT) IMPLANT
BNDG GAUZE ELAST 4 BULKY (GAUZE/BANDAGES/DRESSINGS) ×2 IMPLANT
COVER MAYO STAND STRL (DRAPES) ×1 IMPLANT
COVER PERINEAL POST (MISCELLANEOUS) ×2 IMPLANT
COVER SURGICAL LIGHT HANDLE (MISCELLANEOUS) ×2 IMPLANT
COVER WAND RF STERILE (DRAPES) IMPLANT
DRAPE STERI IOBAN 125X83 (DRAPES) ×2 IMPLANT
DRILL 4.3MMS DISTAL GRADUATED (BIT) ×2
DRSG AQUACEL AG ADV 3.5X 4 (GAUZE/BANDAGES/DRESSINGS) IMPLANT
DRSG AQUACEL AG ADV 3.5X 6 (GAUZE/BANDAGES/DRESSINGS) IMPLANT
DRSG MEPILEX BORDER 4X12 (GAUZE/BANDAGES/DRESSINGS) ×1 IMPLANT
DRSG MEPILEX BORDER 4X4 (GAUZE/BANDAGES/DRESSINGS) ×1 IMPLANT
DURAPREP 26ML APPLICATOR (WOUND CARE) ×2 IMPLANT
ELECT REM PT RETURN 15FT ADLT (MISCELLANEOUS) ×2 IMPLANT
GLOVE BIOGEL PI IND STRL 7.5 (GLOVE) ×1 IMPLANT
GLOVE BIOGEL PI IND STRL 8.5 (GLOVE) ×1 IMPLANT
GLOVE BIOGEL PI INDICATOR 7.5 (GLOVE) ×1
GLOVE BIOGEL PI INDICATOR 8.5 (GLOVE) ×1
GLOVE ECLIPSE 8.0 STRL XLNG CF (GLOVE) ×2 IMPLANT
GLOVE ORTHO TXT STRL SZ7.5 (GLOVE) ×6 IMPLANT
GOWN STRL REUS W/TWL LRG LVL3 (GOWN DISPOSABLE) ×2 IMPLANT
GOWN STRL REUS W/TWL XL LVL3 (GOWN DISPOSABLE) ×2 IMPLANT
GUIDEPIN 3.2X17.5 THRD DISP (PIN) ×1 IMPLANT
GUIDEWIRE BALL NOSE 80CM (WIRE) ×1 IMPLANT
HFN LAG SCREW 10.5MM X 115MM (Orthopedic Implant) ×1 IMPLANT
HFN LH 130 DEG 11MM X 380MM (Orthopedic Implant) ×1 IMPLANT
KIT BASIN (CUSTOM PROCEDURE TRAY) ×2 IMPLANT
KIT TURNOVER KIT A (KITS) ×1 IMPLANT
MANIFOLD NEPTUNE II (INSTRUMENTS) ×2 IMPLANT
PACK GENERAL/GYN (CUSTOM PROCEDURE TRAY) ×2 IMPLANT
PENCIL SMOKE EVACUATOR (MISCELLANEOUS) ×1 IMPLANT
PROTECTOR NERVE ULNAR (MISCELLANEOUS) ×2 IMPLANT
SCREW BONE CORTICAL 5.0X3 (Screw) ×1 IMPLANT
SCREW BONE CORTICAL 5.0X42 (Screw) ×1 IMPLANT
SUT VIC AB 1 CT1 27 (SUTURE) ×4
SUT VIC AB 1 CT1 27XBRD ANTBC (SUTURE) ×1 IMPLANT
SUT VIC AB 2-0 CT1 27 (SUTURE) ×2
SUT VIC AB 2-0 CT1 TAPERPNT 27 (SUTURE) ×1 IMPLANT

## 2019-10-18 NOTE — Consult Note (Signed)
Reason for Consult: left hip fracture Referring Physician: Tana Coast, MD (hospitalist)  Leslie Dean is an 74 y.o. female.  HPI:  Patient is a 74 year old female with chronic back pain secondary to scoliosis, spinal stenosis, hypertension, Marfan syndrome, urinary incontinence presented to ED with fall at home resulting in left hip pain.  Patient reported that she is not sure if she was trying to stand up from her bed or she rolled over from the bed accidentally and fell directly on the concrete floor.  States she was half asleep at 3 AM during the fall.  Patient's daughter and granddaughter lives with her, heard her calling out.  Patient denied any head trauma or any loss of consciousness.  Since a fall, patient ported severe constant 10/10 left hip pain.  She was able to sit up on the floor has not been able to bear weight on the leg.   Denied any headache, vision changes, neck pain, any numbness or tingling.  She has history of bladder incontinence, no saddle anesthesia or neurological changes. Patient received 200 MCG of fentanyl by EMS however complained of significant pain still and received 1 mg Dilaudid and 4 mg morphine in ED.  Subsequently became somewhat somnolent, hypoxic and was placed on 3 L O2 via nasal cannula At the time of my examination, she is still alert and oriented to provide history, protecting her airway. No active cardiac symptoms, no chest pain or acute shortness of breath.  No complaints of upper extremity pain or right lower extremity pain Does complain of some right rib pain  Past Medical History:  Diagnosis Date  . Hypertension   . Scoliosis   . Spinal stenosis     Past Surgical History:  Procedure Laterality Date  . CESAREAN SECTION    . CHOLECYSTECTOMY N/A 12/04/2012   Procedure: LAPAROSCOPIC Liver Cyst Unroofing and removal of gallbladder;  Surgeon: Stark Klein, MD;  Location: [facility];  Service: General;  Laterality: N/A;  . CYST EXCISION     liver cyst  removal  . EYE SURGERY      Family History  Problem Relation Age of Onset  . Marfan syndrome Grandchild     Social History:  reports that she has been smoking. She has been smoking about 1.00 pack per day. She has never used smokeless tobacco. She reports that she does not drink alcohol or use drugs.  Allergies:  Allergies  Allergen Reactions  . Other     Says narcotics cause nausea for her    Medications: I have reviewed the patient's current medications. Scheduled:   Results for orders placed or performed during the hospital encounter of 10/18/19 (from the past 24 hour(s))  CBC with Differential     Status: Abnormal   Collection Time: 10/18/19  8:53 AM  Result Value Ref Range   WBC 19.7 (H) 4.0 - 10.5 K/uL   RBC 4.72 3.87 - 5.11 MIL/uL   Hemoglobin 13.9 12.0 - 15.0 g/dL   HCT 44.5 36.0 - 46.0 %   MCV 94.3 80.0 - 100.0 fL   MCH 29.4 26.0 - 34.0 pg   MCHC 31.2 30.0 - 36.0 g/dL   RDW 14.9 11.5 - 15.5 %   Platelets 218 150 - 400 K/uL   nRBC 0.0 0.0 - 0.2 %   Neutrophils Relative % 88 %   Neutro Abs 17.4 (H) 1.7 - 7.7 K/uL   Lymphocytes Relative 6 %   Lymphs Abs 1.3 0.7 - 4.0 K/uL   Monocytes  Relative 5 %   Monocytes Absolute 1.0 0.1 - 1.0 K/uL   Eosinophils Relative 0 %   Eosinophils Absolute 0.0 0.0 - 0.5 K/uL   Basophils Relative 0 %   Basophils Absolute 0.1 0.0 - 0.1 K/uL   Immature Granulocytes 1 %   Abs Immature Granulocytes 0.09 (H) 0.00 - 0.07 K/uL  Basic metabolic panel     Status: Abnormal   Collection Time: 10/18/19  8:53 AM  Result Value Ref Range   Sodium 141 135 - 145 mmol/L   Potassium 4.3 3.5 - 5.1 mmol/L   Chloride 105 98 - 111 mmol/L   CO2 26 22 - 32 mmol/L   Glucose, Bld 156 (H) 70 - 99 mg/dL   BUN 16 8 - 23 mg/dL   Creatinine, Ser 9.03 0.44 - 1.00 mg/dL   Calcium 9.3 8.9 - 00.9 mg/dL   GFR calc non Af Amer >60 >60 mL/min   GFR calc Af Amer >60 >60 mL/min   Anion gap 10 5 - 15  SARS Coronavirus 2 by RT PCR (hospital order, performed in  [facility] hospital lab) Nasopharyngeal Nasopharyngeal Swab     Status: None   Collection Time: 10/18/19  8:53 AM   Specimen: Nasopharyngeal Swab  Result Value Ref Range   SARS Coronavirus 2 NEGATIVE NEGATIVE  Urinalysis, Routine w reflex microscopic     Status: Abnormal   Collection Time: 10/18/19  8:53 AM  Result Value Ref Range   Color, Urine YELLOW YELLOW   APPearance CLEAR CLEAR   Specific Gravity, Urine 1.015 1.005 - 1.030   pH 5.0 5.0 - 8.0   Glucose, UA NEGATIVE NEGATIVE mg/dL   Hgb urine dipstick NEGATIVE NEGATIVE   Bilirubin Urine NEGATIVE NEGATIVE   Ketones, ur NEGATIVE NEGATIVE mg/dL   Protein, ur NEGATIVE NEGATIVE mg/dL   Nitrite POSITIVE (A) NEGATIVE   Leukocytes,Ua TRACE (A) NEGATIVE   RBC / HPF 0-5 0 - 5 RBC/hpf   WBC, UA 6-10 0 - 5 WBC/hpf   Bacteria, UA MANY (A) NONE SEEN   Squamous Epithelial / LPF 0-5 0 - 5   Mucus PRESENT    Hyaline Casts, UA PRESENT   Brain natriuretic peptide     Status: Abnormal   Collection Time: 10/18/19 11:15 AM  Result Value Ref Range   B Natriuretic Peptide 175.4 (H) 0.0 - 100.0 pg/mL  CK     Status: None   Collection Time: 10/18/19 11:15 AM  Result Value Ref Range   Total CK 230 38 - 234 U/L    X-ray: CLINICAL DATA:  Pain following fall  EXAM: DG HIP (WITH OR WITHOUT PELVIS) 2-3V LEFT  COMPARISON:  None.  FINDINGS: Frontal pelvis as well as frontal and lateral left hip images were obtained. There is a comminuted intertrochanteric femur fracture on the left with varus angulation at the fracture site. There is impaction of the proximal femoral shaft into the greater trochanter. No other fractures are evident. No dislocation.  Bones are diffusely osteoporotic. There is severe narrowing of each hip joint. There is an apparent pessary type device in the lower pelvic region.  IMPRESSION: Comminuted intertrochanteric femur fracture on the left with impaction at the fracture site and varus angulation.  Severe narrowing of each hip joint noted. No dislocation. Underlying osteoporosis.   Electronically Signed   By: Bretta Bang III M.D.  Looks like a reverse obliquity fracture pattern of proximal left hip  ROS: Constitutional: Denies fever, chills, diaphoresis, poor appetite and fatigue.  HEENT: Denies photophobia, eye pain, redness, hearing loss, ear pain, congestion, sore throat, rhinorrhea, sneezing, mouth sores, trouble swallowing, neck pain, neck stiffness and tinnitus.   Respiratory: Denies SOB, DOE, cough, chest tightness,  and wheezing.   Cardiovascular: Denies chest pain, palpitations and leg swelling.  Gastrointestinal: Denies nausea, vomiting, abdominal pain, diarrhea, constipation, blood in stool and abdominal distention.  Genitourinary: Denies dysuria, hematuria, flank pain and difficulty urinating.  Has history of incontinence, states due to spinal stenosis.  No hematuria. Musculoskeletal: Please see HPI, history of scoliosis with "60 degree curve" limiting her functional ability Skin: Denies pallor, rash and wound.  Neurological: Denies dizziness, seizures, syncope, weakness, light-headedness, numbness and headaches.  Hematological: Denies adenopathy. Easy bruising, personal or family bleeding history  Psychiatric/Behavioral: Denies suicidal ideation, mood changes, confusion, nervousness, sleep disturbance and agitation   Blood pressure 122/66, pulse 68, temperature 98.2 F (36.8 C), temperature source Oral, resp. rate 18, SpO2 100 %.  Physical Exam: General: Alert, awake, oriented x3, in no acute distress, visibly uncomfortable. Eyes: pink conjunctiva,anicteric sclera, pupils equal and reactive to light and accomodation, HEENT: normocephalic, atraumatic, oropharynx clear Neck: supple, no masses or lymphadenopathy, no goiter, no bruits, no JVD CVS: Regular rate and rhythm, without murmurs, rubs or gallops. No lower extremity edema Resp : Clear to auscultation  bilaterally, no wheezing, rales or rhonchi. GI : Soft, nontender, nondistended, positive bowel sounds, no masses. No hepatomegaly. No hernia.  Musculoskeletal: Left leg shortened and externally rotated, no TTP on the left knee tib-fib ankle or foot.  Right lower extremity with full range of motion. Neuro: Grossly intact, no focal neurological deficits, strength 5/5 upper extremities bilaterally, 5/5 in RLE Psych: alert and oriented x 3, normal mood and affect Skin: no rashes or lesions, warm and dry  Assessment/Plan: 1. Left proximal femur fracture - reverse obliquity  Plan: To OR tonight for ORIF Risks and necessities reviewed Consent ordered Weight bearing status to be determined post operatively  Shelda Pal 10/18/2019, 3:53 PM

## 2019-10-18 NOTE — ED Provider Notes (Addendum)
[facility]-EMERGENCY DEPT Provider Note   CSN: [id number] Arrival date & time: 10/18/19  2979     History Chief Complaint  Patient presents with  . Fall  . Hip Pain    Leslie Dean is a 74 y.o. female with history of scoliosis, chronic back pain, spinal stenosis, hypertension, urinary incontinence, Marfan syndrome brought to the ER by EMS after a fall at home.  History obtained from patient and EMS at bedside.  Patient is not sure how she fell but thinks she rolled off from bed and fell directly on concrete floor.  She thinks she was still half asleep during the fall.  She denies head trauma or LOC. She landed mostly on her low back and left hip.  Reports severe, constant left hip, hip and low back pain since the fall.  She was able to sit up with significant pain.  Was not able to stand up.  She had a watch on and states she was on the ground for a couple of hours before her daughter heard her and call 911.  Reports left leg has always been shorter than the right and attributes this to her scoliosis history.  Denies head injury, headache, vision changes, neck pain, extremity paresthesias or numbness.  No saddle anesthesia, new bladder or bowel incontinence.  Does not take any anticoagulants other than aspirin 81 mg.  EMS gave 200 mcg of fentanyl on route.  Takes tylenol at home for chronic pain, no opioids.  No anticoagulants.  Last p.o. intake 9 PM last night.   HPI     Past Medical History:  Diagnosis Date  . Hypertension   . Scoliosis   . Spinal stenosis     Patient Active Problem List   Diagnosis Date Noted  . Closed left hip fracture (HCC) 10/18/2019  . Abdominal pain 12/03/2012  . Hepatic cyst 12/03/2012  . HTN (hypertension) 12/03/2012  . 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 CESAREAN SECTION    . CHOLECYSTECTOMY N/A 12/04/2012   Procedure: LAPAROSCOPIC Liver Cyst Unroofing and removal of  gallbladder;  Surgeon: Almond Lint, MD;  Location: [facility] ORS;  Service: General;  Laterality: N/A;  . CYST EXCISION     liver cyst removal  . EYE SURGERY       OB History   No obstetric history on file.     Family History  Problem Relation Age of Onset  . Marfan syndrome Grandchild     Social History   Tobacco Use  . Smoking status: Current Every Day Smoker    Packs/day: 1.00  . Smokeless tobacco: Never Used  Substance Use Topics  . Alcohol use: No  . Drug use: No    Home Medications Prior to Admission medications   Medication Sig Start Date End Date Taking? Authorizing Provider  aspirin 81 MG tablet Take 81 mg by mouth daily.   Yes [provider]  Cholecalciferol (VITAMIN D3) 50 MCG (2000 UT) capsule Take 2,000 Units by mouth daily.   Yes [provider]  furosemide (LASIX) 20 MG tablet Take 20 mg by mouth daily. 09/10/19  Yes [provider]  losartan (COZAAR) 50 MG tablet Take 50 mg by mouth daily. 09/30/19  Yes [provider]  Magnesium 500 MG CAPS Take 500 mg by mouth daily.   Yes [provider]  metoprolol succinate (TOPROL-XL) 50 MG 24 hr tablet Take 50 mg by mouth daily. 10/01/19  Yes [provider]  trazodone (DESYREL) 300 MG tablet Take 300 mg by mouth at bedtime.   Yes [provider]  zinc gluconate 50 MG tablet Take 50 mg by mouth daily.   Yes [provider]  amoxicillin-clavulanate (AUGMENTIN) 875-125 MG per tablet Take 1 tablet by mouth 2 (two) times daily. Patient not taking: Reported on 10/18/2019 12/06/12   Penny PiaVega, [place], MD  HYDROcodone-acetaminophen (NORCO/VICODIN) 5-325 MG per tablet Take 1 tablet by mouth every 6 (six) hours as needed for pain. Patient not taking: Reported on 10/18/2019 12/06/12   Penny PiaVega, [place], MD    Allergies    Other  Review of Systems   Review of Systems  Genitourinary: Positive for difficulty urinating (chronic).  Musculoskeletal: Positive for arthralgias,  back pain and gait problem.  All other systems reviewed and are negative.   Physical Exam Updated Vital Signs BP 135/67   Pulse 67   Temp 98.2 F (36.8 C) (Oral)   Resp 16   SpO2 94%   Physical Exam Constitutional:      General: She is in acute distress.     Appearance: She is well-developed.     Comments: Looks uncomfortable due to pain  HENT:     Head: Atraumatic.     Comments: No facial, nasal, scalp bone tenderness.     Ears:     Comments: No hemotympanum. No Battle's sign.    Nose:     Comments: No intranasal bleeding or rhinorrhea. Septum midline    Mouth/Throat:     Comments: No intraoral bleeding or injury. No malocclusion. MMM. Dentition appears stable.  Eyes:     Conjunctiva/sclera: Conjunctivae normal.     Comments: Lids normal. EOMs and PERRL intact. No racoon's eyes   Neck:     Comments: C-spine: no midline or paraspinal muscular tenderness. Full active ROM of cervical spine w/o pain. Trachea midline Cardiovascular:     Rate and Rhythm: Normal rate and regular rhythm.     Pulses:          Radial pulses are 1+ on the right side and 1+ on the left side.       Dorsalis pedis pulses are 1+ on the right side and 1+ on the left side.     Heart sounds: Normal heart sounds, S1 normal and S2 normal.  Pulmonary:     Effort: Pulmonary effort is normal.     Breath sounds: Normal breath sounds. No decreased breath sounds.  Abdominal:     Palpations: Abdomen is soft.     Tenderness: There is no abdominal tenderness.  Musculoskeletal:        General: Tenderness present. No deformity. Normal range of motion.     Comments: TL spine: Unable to palpate midline spine, patient did not tolerate roll in bed due to left hip pain. Pelvis: TTP over left inguinal crease and anterior hip.  TTP over left lateral femur.  No obvious bruising.  Unable to range left hip due to pain.  Left leg is shortened and held in external rotation.  No TTP over left knee, tib/fib, ankle or foot.  Right  hip and leg nontender with full range of motion.  No instability with AP/L compression.  Skin:    General: Skin is warm and dry.     Capillary Refill: Capillary refill takes less than 2 seconds.     Findings: Bruising present.     Comments: Ecchymosis noted in left anterior tibia/fibula, mildly tender.  Neurological:  Mental Status: She is alert, oriented to person, place, and time and easily aroused.     Comments: Speech is fluent without obvious dysarthria or dysphasia. Strength 5/5 with hand grip and ankle F/E.   Sensation to light touch intact in hands and feet.  CN II-XII grossly intact bilaterally.   Psychiatric:        Behavior: Behavior normal. Behavior is cooperative.        Thought Content: Thought content normal.     ED Results / Procedures / Treatments   Labs (all labs ordered are listed, but only abnormal results are displayed) Labs Reviewed  CBC WITH DIFFERENTIAL/PLATELET - Abnormal; Notable for the following components:      Result Value   WBC 19.7 (*)    Neutro Abs 17.4 (*)    Abs Immature Granulocytes 0.09 (*)    All other components within normal limits  BASIC METABOLIC PANEL - Abnormal; Notable for the following components:   Glucose, Bld 156 (*)    All other components within normal limits  URINALYSIS, ROUTINE W REFLEX MICROSCOPIC - Abnormal; Notable for the following components:   Nitrite POSITIVE (*)    Leukocytes,Ua TRACE (*)    Bacteria, UA MANY (*)    All other components within normal limits  SARS CORONAVIRUS 2 BY RT PCR (HOSPITAL ORDER, PERFORMED IN [facility])  URINE CULTURE  CULTURE, BLOOD (ROUTINE X 2)  CULTURE, BLOOD (ROUTINE X 2)    EKG None  Radiology DG Lumbar Spine 2-3 Views  Result Date: 10/18/2019 CLINICAL DATA:  Pain following fall EXAM: LUMBAR SPINE - 2-3 VIEW COMPARISON:  None. FINDINGS: Note that lateral images somewhat limited due to cross-table lateral positioning. Frontal, lateral, and spot lumbosacral  lateral images were obtained. There are 5 non-rib-bearing lumbar type vertebral bodies. Bones are osteoporotic. There is marked levorotoscoliosis in the mid lumbar spine. There is bony remodeling due to the chronic scoliosis but no well-defined acute fracture. No appreciable spondylolisthesis. There is moderate disc space narrowing at all levels, exacerbated by scoliosis. There is aortic atherosclerosis. IMPRESSION: Osteoporosis. Marked scoliosis with bony remodeling throughout the lumbar region. No acute fracture or or spondylolisthesis with limitations due to the cross-table lateral imaging of the lumbar spine on lateral view as well as scoliosis. Moderate osteoarthritic change throughout the lumbar region. Aortic Atherosclerosis (ICD10-I70.0). Electronically Signed   By: Bretta Bang III M.D.   On: 10/18/2019 08:24   DG Hip Unilat W or Wo Pelvis 2-3 Views Left  Result Date: 10/18/2019 CLINICAL DATA:  Pain following fall EXAM: DG HIP (WITH OR WITHOUT PELVIS) 2-3V LEFT COMPARISON:  None. FINDINGS: Frontal pelvis as well as frontal and lateral left hip images were obtained. There is a comminuted intertrochanteric femur fracture on the left with varus angulation at the fracture site. There is impaction of the proximal femoral shaft into the greater trochanter. No other fractures are evident. No dislocation. Bones are diffusely osteoporotic. There is severe narrowing of each hip joint. There is an apparent pessary type device in the lower pelvic region. IMPRESSION: Comminuted intertrochanteric femur fracture on the left with impaction at the fracture site and varus angulation. Severe narrowing of each hip joint noted. No dislocation. Underlying osteoporosis. Electronically Signed   By: Bretta Bang III M.D.   On: 10/18/2019 08:21   DG Femur Min 2 Views Left  Result Date: 10/18/2019 CLINICAL DATA:  Pain following fall EXAM: LEFT FEMUR 2 VIEWS COMPARISON:  Pelvis and left hip radiographs Oct 18, 2019  FINDINGS: Frontal and lateral views obtained. Comminuted fracture of the intertrochanteric left femur described in pelvis and left hip report. More distally, no fracture or dislocation. No knee joint effusion. Moderate narrowing medially in the knee region. Marked narrowing left hip region. IMPRESSION: Comminuted intertrochanteric femur fracture proximally with varus angulation at the fracture site. More distally, no evident fracture. No dislocation. There is osteoarthritic change in the left hip and knee joint regions. Electronically Signed   By: Lowella Grip III M.D.   On: 10/18/2019 08:25    Procedures .Critical Care Performed by: Kinnie Feil, PA-C Authorized by: Kinnie Feil, PA-C   Critical care provider statement:    Critical care time (minutes):  45   Critical care was necessary to treat or prevent imminent or life-threatening deterioration of the following conditions:  Trauma (multiple doses of opioid pain medicines requiring mulitple re-evaluations for oversedation, requiring O2 )   Critical care was time spent personally by me on the following activities:  Discussions with consultants, evaluation of patient's response to treatment, examination of patient, ordering and performing treatments and interventions, ordering and review of laboratory studies, ordering and review of radiographic studies, pulse oximetry, re-evaluation of patient's condition, obtaining history from patient or surrogate and review of old charts   I assumed direction of critical care for this patient from another provider in my specialty: no     (including critical care time)  Medications Ordered in ED Medications  cefTRIAXone (ROCEPHIN) 1 g in sodium chloride 0.9 % 100 mL IVPB (has no administration in time range)  HYDROmorphone (DILAUDID) injection 1 mg (1 mg Intravenous Given 10/18/19 0723)  morphine 4 MG/ML injection 4 mg (4 mg Intravenous Given 10/18/19 0902)  0.9 %  sodium chloride infusion (  Intravenous New Bag/Given 10/18/19 0913)    ED Course  I have reviewed the triage vital signs and the nursing notes.  Pertinent labs & imaging results that were available during my care of the patient were reviewed by me and considered in my medical decision making (see chart for details).  Clinical Course as of Oct 17 956  Mon Oct 18, 2019  0924 WBC(!): 19.7 [CG]  0924 Bacteria, UA(!): MANY [CG]  0924 Nitrite(!): POSITIVE [CG]  0924 WBC, UA: 6-10 [CG]  0924 Comminuted intertrochanteric femur fracture on the left with impaction at the fracture site and varus angulation. Severe narrowing of each hip joint noted. No dislocation. Underlying osteoporosis.  DG Hip Unilat W or Wo Pelvis 2-3 Views Left [CG]  0925 Non acute   DG Femur Min 2 Views Left [CG]  6045 Scoliosis, non acute   DG Lumbar Spine 2-3 Views [CG]  4098 Reevaluated patient, now on 3 L [place] likely from sedation from pain medicine.  Still oriented x4, protecting her airway and able to have conversation.  Family at bedside.  Discussed x-ray findings, fracture and plan to consult orthopedic surgery and admit.  Family in agreement.  Last p.o. intake 9 PM last night.   [CG]  1191 Spoke to Dr. Ihor Gully with orthopedic surgery, recommends keeping patient n.p.o. and will likely take to the OR later today.   [CG]    Clinical Course User Index [CG] Arlean Hopping   MDM Rules/Calculators/A&P                      74 year old female brought to the ER by EMS after what sounds like a mechanical fall from bed to the ground.  Reports  severe left hip and low back pain but denies any other injuries.  Exam reveals left lower extremity is shortened and externally rotated but no other obvious signs of head or cervical spine injury.  Unable to roll patient due to pain to evaluate TL spine.  Will order x-rays, pain medicines.  She is neurologically intact has no signs of head trauma or headache, I do not think head CT is indicated.  4142: ER  imaging and labs personally reviewed and interpreted.  Positive for left hip fracture, other imaging is nonacute.  Incidentally found positive nitrate UTI.  Patient and family updated on findings.  Further history obtained from family at bedside who corroborates same history.  Consulted orthopedic surgeon Dr. Charlann Boxer who anticipates OR later today, will keep patient n.p.o.  Consulted hospitalist who has accepted patient to their service.  Pain control has been difficult in the ER and patient has required several doses of opioid medicines.  She received 200 mcg of fentanyl on route without pain relief.  She has been given Dilaudid and morphine now with better pain control.  She has required several reevaluation's here in the ER to ensure she is not oversedated.  Is requiring 2 to 3 L [place] due to respiratory suppression from opioid use but is awake, talkative, protecting her airway and asking appropriate questions.  Will monitor closely.  Rocephin given for UTI, culture pending.  Patient shared with EDP.  Final Clinical Impression(s) / ED Diagnoses Final diagnoses:  Fall, initial encounter  Closed fracture of left hip, initial encounter [facility])    Rx / DC Orders ED Discharge Orders    None       Liberty Handy, PA-C 10/18/19 3953    Virgina Norfolk, DO 10/18/19 1334

## 2019-10-18 NOTE — Brief Op Note (Signed)
10/18/2019  9:01 PM  PATIENT:  Leslie Dean  74 y.o. female  PRE-OPERATIVE DIAGNOSIS:  reverse left obliquity intertroch fracture  POST-OPERATIVE DIAGNOSIS:  reverse left obliquity intertroch fracture  PROCEDURE:  Procedure(s): INTRAMEDULLARY (IM) NAIL FEMORAL (Left)  SURGEON:  Surgeon(s) and Role:    Durene Romans, MD - Primary  PHYSICIAN ASSISTANT: None  ANESTHESIA:   spinal  EBL:  200 cc  BLOOD ADMINISTERED:none  DRAINS: none   LOCAL MEDICATIONS USED:  NONE  SPECIMEN:  No Specimen  DISPOSITION OF SPECIMEN:  N/A  COUNTS:  YES  TOURNIQUET:  * No tourniquets in log *  DICTATION: .Other Dictation: Dictation Number [id number]  PLAN OF CARE: Admit to inpatient   PATIENT DISPOSITION:  PACU - hemodynamically stable.   Delay start of Pharmacological VTE agent (>24hrs) due to surgical blood loss or risk of bleeding: no

## 2019-10-18 NOTE — Anesthesia Procedure Notes (Signed)
Date/Time: 10/18/2019 11:01 PM Performed by: Minerva Ends, CRNA Oxygen Delivery Method: Simple face mask Placement Confirmation: positive ETCO2 and breath sounds checked- equal and bilateral Dental Injury: Teeth and Oropharynx as per pre-operative assessment

## 2019-10-18 NOTE — Anesthesia Preprocedure Evaluation (Addendum)
Anesthesia Evaluation  Patient identified by MRN, date of birth, ID band Patient awake    Reviewed: Allergy & Precautions, NPO status , Patient's Chart, lab work & pertinent test results, reviewed documented beta blocker date and time   Airway Mallampati: IV  TM Distance: >3 FB Neck ROM: Full    Dental  (+) Partial Lower, Poor Dentition, Loose, Dental Advisory Given, Missing Teeth extremely loose:   Pulmonary Current Smoker and Patient abstained from smoking.,    Pulmonary exam normal breath sounds clear to auscultation       Cardiovascular hypertension, Pt. on medications and Pt. on home beta blockers Normal cardiovascular exam Rhythm:Regular Rate:Normal  EKG 10/18/19 NSR, RBBB pattern, LPFB   Neuro/Psych Numbness both feet- peripheral neuropathy negative neurological ROS  negative psych ROS   GI/Hepatic negative GI ROS, Neg liver ROS,   Endo/Other  negative endocrine ROSObesity  Renal/GU negative Renal ROS   Urinary incontinence negative genitourinary   Musculoskeletal Hx/o Marfan's syndrome Left intertrochanteric hip Fx Scoliosis   Abdominal (+) + obese,   Peds  Hematology negative hematology ROS (+)   Anesthesia Other Findings   Reproductive/Obstetrics                           Anesthesia Physical Anesthesia Plan  ASA: III  Anesthesia Plan: Spinal   Post-op Pain Management:    Induction: Intravenous  PONV Risk Score and Plan: 2 and Propofol infusion, Ondansetron and Treatment may vary due to age or medical condition  Airway Management Planned: Natural Airway and Simple Face Mask  Additional Equipment:   Intra-op Plan:   Post-operative Plan:   Informed Consent: I have reviewed the patients History and Physical, chart, labs and discussed the procedure including the risks, benefits and alternatives for the proposed anesthesia with the patient or authorized representative who  has indicated his/her understanding and acceptance.     Dental advisory given  Plan Discussed with: CRNA and Surgeon  Anesthesia Plan Comments:         Anesthesia Quick Evaluation

## 2019-10-18 NOTE — Anesthesia Procedure Notes (Signed)
Date/Time: 10/18/2019 9:01 PM Performed by: Minerva Ends, CRNA Oxygen Delivery Method: Simple face mask Placement Confirmation: positive ETCO2 and breath sounds checked- equal and bilateral Dental Injury: Teeth and Oropharynx as per pre-operative assessment

## 2019-10-18 NOTE — ED Triage Notes (Signed)
Patient here from home reporting fall out of bed this morning. Unknown as to how long patient was on floor. Reports left hip pain.

## 2019-10-18 NOTE — ED Provider Notes (Signed)
Medical screening examination/treatment/procedure(s) were conducted as a shared visit with non-physician practitioner(s) and myself.  I personally evaluated the patient during the encounter. Briefly, the patient is a 74 y.o. female with history of hypertension who presents to the ED with left hip pain after fall.  States fall happened in the middle the night.  She thinks that she was getting up out of bed but does not remember how she fell.  She remembers falling to the ground and having left hip pain.  Did not hit her head or lose consciousness.  Neurovascularly intact on exam.  X-ray shows left hip fracture.  No dislocation.  We will have her admitted.  Medical screening labs ordered.  This chart was dictated using voice recognition software.  Despite best efforts to proofread,  errors can occur which can change the documentation meaning.     EKG Interpretation None             Virgina Norfolk, DO 10/18/19 0900

## 2019-10-18 NOTE — Progress Notes (Signed)
Notified Dr Charlann Boxer of transferring Pt to 1514.  O2 applied [place] at 2L for sats 88%.  Audible wheezing noted at time of transfer. Pt having severe discomfort with transfer to bed.

## 2019-10-18 NOTE — Transfer of Care (Signed)
Immediate Anesthesia Transfer of Care Note  Patient: Jaydalyn O Sharpless  Procedure(s) Performed: INTRAMEDULLARY (IM) NAIL FEMORAL (Left )  Patient Location: PACU  Anesthesia Type:General  Level of Consciousness: awake  Airway & Oxygen Therapy: Patient Spontanous Breathing and Patient connected to face mask oxygen  Post-op Assessment: Report given to RN and Post -op Vital signs reviewed and stable  Post vital signs: Reviewed and stable  Last Vitals:  Vitals Value Taken Time  BP 130/49 10/18/19 2309  Temp    Pulse    Resp 20 10/18/19 2314  SpO2    Vitals shown include unvalidated device data.  Last Pain:  Vitals:   10/18/19 2014  TempSrc:   PainSc: 9       Patients Stated Pain Goal: 5 (10/18/19 1944)  Complications: No apparent anesthesia complications

## 2019-10-18 NOTE — Anesthesia Procedure Notes (Signed)
Procedure Name: Intubation Date/Time: 10/18/2019 9:44 PM Performed by: Minerva Ends, CRNA Pre-anesthesia Checklist: Patient identified, Emergency Drugs available, Suction available and Patient being monitored Patient Re-evaluated:Patient Re-evaluated prior to induction Oxygen Delivery Method: Circle System Utilized Preoxygenation: Pre-oxygenation with 100% oxygen Induction Type: IV induction, Cricoid Pressure applied and Rapid sequence Ventilation: Mask ventilation without difficulty Laryngoscope Size: Glidescope Grade View: Grade II Tube type: Oral Number of attempts: 1 Airway Equipment and Method: Stylet Placement Confirmation: ETT inserted through vocal cords under direct vision,  positive ETCO2 and breath sounds checked- equal and bilateral Secured at: 23 cm Tube secured with: Tape Dental Injury: Teeth and Oropharynx as per pre-operative assessment  Comments: Smooth RSI-- Jean Rosenthal-- Glidescope intubation AM CRNA atraumatic-- pt with very poor dentition-- many missing, chipped loose teeth-- bottom esp loose-- teeth and mouth unchanged with laryngoscopy-- bilat BS Jean Rosenthal

## 2019-10-18 NOTE — H&P (Signed)
History and Physical        Hospital Admission Note Date: 10/18/2019  Patient name: Leslie Dean Medical record number: [id number] Date of birth: 1946/05/05 Age: 74 y.o. Gender: female  PCP: Patient, No Pcp Per    Patient coming from: Home  I have reviewed all records in the [facility].    Chief Complaint:  Left hip pain since a fall this morning  HPI: Patient is a 74 year old female with chronic back pain secondary to scoliosis, spinal stenosis, hypertension, Marfan syndrome, urinary incontinence presented to ED with fall at home resulting in left hip pain.  Patient reported that she is not sure if she was trying to stand up from her bed or she rolled over from the bed accidentally and fell directly on the concrete floor.  States she was half asleep at 3 AM during the fall.  Patient's daughter and granddaughter lives with her, heard her calling out.  Patient denied any head trauma or any loss of consciousness.  Since a fall, patient ported severe constant 10/10 left hip pain.  She was able to sit up on the floor has not been able to bear weight on the leg.   Denied any headache, vision changes, neck pain, any numbness or tingling.  She has history of bladder incontinence, no saddle anesthesia or neurological changes. Patient received 200 MCG of fentanyl by EMS however complained of significant pain still and received 1 mg Dilaudid and 4 mg morphine in ED.  Subsequently became somewhat somnolent, hypoxic and was placed on 3 L O2 via nasal cannula At the time of my examination, she is still alert and oriented to provide history, protecting her airway. No active cardiac symptoms, no chest pain or acute shortness of breath.  ED work-up/course:  In ED temp 98.2, respiratory rate 16, pulse 67, BP 119/76  Sodium 141, potassium 4.3, glucose 156, creatinine 0.9, WBCs 19.7,  hemoglobin 13.9, hematocrit 44.5, platelets 218  EKG with sinus rhythm, RBBB, LPFB (prior EKG in 11/2012 had RBBB)   Review of Systems: Positives marked in 'bold' Constitutional: Denies fever, chills, diaphoresis, poor appetite and fatigue.  HEENT: Denies photophobia, eye pain, redness, hearing loss, ear pain, congestion, sore throat, rhinorrhea, sneezing, mouth sores, trouble swallowing, neck pain, neck stiffness and tinnitus.   Respiratory: Denies SOB, DOE, cough, chest tightness,  and wheezing.   Cardiovascular: Denies chest pain, palpitations and leg swelling.  Gastrointestinal: Denies nausea, vomiting, abdominal pain, diarrhea, constipation, blood in stool and abdominal distention.  Genitourinary: Denies dysuria, hematuria, flank pain and difficulty urinating.  Has history of incontinence, states due to spinal stenosis.  No hematuria. Musculoskeletal: Please see HPI Skin: Denies pallor, rash and wound.  Neurological: Denies dizziness, seizures, syncope, weakness, light-headedness, numbness and headaches.  Hematological: Denies adenopathy. Easy bruising, personal or family bleeding history  Psychiatric/Behavioral: Denies suicidal ideation, mood changes, confusion, nervousness, sleep disturbance and agitation  Past Medical History: Past Medical History:  Diagnosis Date  . Hypertension   . Scoliosis   . Spinal stenosis     Past Surgical History:  Procedure Laterality Date  . CESAREAN SECTION    . CHOLECYSTECTOMY N/A 12/04/2012   Procedure: LAPAROSCOPIC  Liver Cyst Unroofing and removal of gallbladder;  Surgeon: Almond Lint, MD;  Location: [facility];  Service: General;  Laterality: N/A;  . CYST EXCISION     liver cyst removal  . EYE SURGERY      Medications: Prior to Admission medications   Medication Sig Start Date End Date Taking? Authorizing Provider  aspirin 81 MG tablet Take 81 mg by mouth daily.   Yes [provider]  Cholecalciferol (VITAMIN D3) 50 MCG (2000 UT)  capsule Take 2,000 Units by mouth daily.   Yes [provider]  furosemide (LASIX) 20 MG tablet Take 20 mg by mouth daily. 09/10/19  Yes [provider]  losartan (COZAAR) 50 MG tablet Take 50 mg by mouth daily. 09/30/19  Yes [provider]  Magnesium 500 MG CAPS Take 500 mg by mouth daily.   Yes [provider]  metoprolol succinate (TOPROL-XL) 50 MG 24 hr tablet Take 50 mg by mouth daily. 10/01/19  Yes [provider]  trazodone (DESYREL) 300 MG tablet Take 300 mg by mouth at bedtime.   Yes [provider]  zinc gluconate 50 MG tablet Take 50 mg by mouth daily.   Yes [provider]  amoxicillin-clavulanate (AUGMENTIN) 875-125 MG per tablet Take 1 tablet by mouth 2 (two) times daily. Patient not taking: Reported on 10/18/2019 12/06/12   Penny Pia, MD  HYDROcodone-acetaminophen (NORCO/VICODIN) 5-325 MG per tablet Take 1 tablet by mouth every 6 (six) hours as needed for pain. Patient not taking: Reported on 10/18/2019 12/06/12   Penny Pia, MD    Allergies:   Allergies  Allergen Reactions  . Other     Says narcotics cause nausea for her    Social History:  reports that she has been smoking. She has been smoking about 1.00 pack per day. She has never used smokeless tobacco. She reports that she does not drink alcohol or use drugs.  Family History: Family History  Problem Relation Age of Onset  . Marfan syndrome Grandchild     Physical Exam: Blood pressure 135/67, pulse 67, temperature 98.2 F (36.8 C), temperature source Oral, resp. rate 16, SpO2 94 %. General: Alert, awake, oriented x3, in no acute distress, visibly uncomfortable. Eyes: pink conjunctiva,anicteric sclera, pupils equal and reactive to light and accomodation, HEENT: normocephalic, atraumatic, oropharynx clear Neck: supple, no masses or lymphadenopathy, no goiter, no bruits, no JVD CVS: Regular rate and rhythm, without murmurs, rubs or gallops. No lower  extremity edema Resp : Clear to auscultation bilaterally, no wheezing, rales or rhonchi. GI : Soft, nontender, nondistended, positive bowel sounds, no masses. No hepatomegaly. No hernia.  Musculoskeletal: Left leg shortened, held in external rotation, no TTP on the left knee tib-fib ankle or foot.  Right lower extremity with full range of motion. Neuro: Grossly intact, no focal neurological deficits, strength 5/5 upper extremities bilaterally, 5/5 in RLE Psych: alert and oriented x 3, normal mood and affect Skin: no rashes or lesions, warm and dry   LABS on Admission: I have personally reviewed all the labs and imagings below    Basic Metabolic Panel: Recent Labs  Lab 10/18/19 0853  NA 141  K 4.3  CL 105  CO2 26  GLUCOSE 156*  BUN 16  CREATININE 0.90  CALCIUM 9.3   Liver Function Tests: No results for input(s): AST, ALT, ALKPHOS, BILITOT, PROT, ALBUMIN in the last 168 hours. No results for input(s): LIPASE, AMYLASE in the last 168 hours. No results for input(s): AMMONIA in the  last 168 hours. CBC: Recent Labs  Lab 10/18/19 0853  WBC 19.7*  NEUTROABS 17.4*  HGB 13.9  HCT 44.5  MCV 94.3  PLT 218   Cardiac Enzymes: No results for input(s): CKTOTAL, CKMB, CKMBINDEX, TROPONINI in the last 168 hours. BNP: Invalid input(s): POCBNP CBG: No results for input(s): GLUCAP in the last 168 hours.  Radiological Exams on Admission:  DG Lumbar Spine 2-3 Views  Result Date: 10/18/2019 CLINICAL DATA:  Pain following fall EXAM: LUMBAR SPINE - 2-3 VIEW COMPARISON:  None. FINDINGS: Note that lateral images somewhat limited due to cross-table lateral positioning. Frontal, lateral, and spot lumbosacral lateral images were obtained. There are 5 non-rib-bearing lumbar type vertebral bodies. Bones are osteoporotic. There is marked levorotoscoliosis in the mid lumbar spine. There is bony remodeling due to the chronic scoliosis but no well-defined acute fracture. No appreciable spondylolisthesis.  There is moderate disc space narrowing at all levels, exacerbated by scoliosis. There is aortic atherosclerosis. IMPRESSION: Osteoporosis. Marked scoliosis with bony remodeling throughout the lumbar region. No acute fracture or or spondylolisthesis with limitations due to the cross-table lateral imaging of the lumbar spine on lateral view as well as scoliosis. Moderate osteoarthritic change throughout the lumbar region. Aortic Atherosclerosis (ICD10-I70.0). Electronically Signed   By: Bretta Bang III M.D.   On: 10/18/2019 08:24   DG Hip Unilat W or Wo Pelvis 2-3 Views Left  Result Date: 10/18/2019 CLINICAL DATA:  Pain following fall EXAM: DG HIP (WITH OR WITHOUT PELVIS) 2-3V LEFT COMPARISON:  None. FINDINGS: Frontal pelvis as well as frontal and lateral left hip images were obtained. There is a comminuted intertrochanteric femur fracture on the left with varus angulation at the fracture site. There is impaction of the proximal femoral shaft into the greater trochanter. No other fractures are evident. No dislocation. Bones are diffusely osteoporotic. There is severe narrowing of each hip joint. There is an apparent pessary type device in the lower pelvic region. IMPRESSION: Comminuted intertrochanteric femur fracture on the left with impaction at the fracture site and varus angulation. Severe narrowing of each hip joint noted. No dislocation. Underlying osteoporosis. Electronically Signed   By: Bretta Bang III M.D.   On: 10/18/2019 08:21   DG Femur Min 2 Views Left  Result Date: 10/18/2019 CLINICAL DATA:  Pain following fall EXAM: LEFT FEMUR 2 VIEWS COMPARISON:  Pelvis and left hip radiographs Oct 18, 2019 FINDINGS: Frontal and lateral views obtained. Comminuted fracture of the intertrochanteric left femur described in pelvis and left hip report. More distally, no fracture or dislocation. No knee joint effusion. Moderate narrowing medially in the knee region. Marked narrowing left hip region.  IMPRESSION: Comminuted intertrochanteric femur fracture proximally with varus angulation at the fracture site. More distally, no evident fracture. No dislocation. There is osteoarthritic change in the left hip and knee joint regions. Electronically Signed   By: Bretta Bang III M.D.   On: 10/18/2019 08:25      EKG: Independently reviewed. *EKG with sinus rhythm, RBBB, LPFB (prior EKG in 11/2012 had RBBB)*   Assessment/Plan Principal Problem:   Closed left hip fracture (HCC) secondary to mechanical fall -Patient typically walks by herself or occasionally with walker, has scoliosis and chronic back pain issues, however no recent falls.  -Continue n.p.o. status, maintenance fluids with D5 half-normal saline -Hold prophylactic anticoagulation due to possible surgery today. -Orthopedics has been consulted, Dr. Charlann Boxer, plan for OR today -Will give 1 Toradol 30 mg IV x1, continue morphine, avoid higher dose of narcotics due  to risk of respiratory depression and oversedation -PT OT evaluation after surgery -Pain control, DVT prophylaxis per orthopedics   Active Problems: Acute respiratory failure with hypoxia -Likely due to high-dose narcotics, currently oriented and maintaining airway, on 3 L O2 via nasal cannula.  Not on home O2 -Wean O2 as tolerated -Obtain baseline chest x-ray.  On Lasix at home, will check BNP however does not appear to be fluid overloaded    HTN (hypertension) -Currently BP soft, hold Lasix, Toprol-XL, losartan - will place on IV hydralazine as needed with parameters until patient's BP is stabilized and taking PO    Marfans syndrome, history of scoliosis and chronic back pain -PT OT evaluation postop    Acute lower UTI -No acute urinary symptoms, leukocytosis.  States has chronic urinary incontinence -UA trace leukocytes, positive nitrite, many bacteria, WBCs 6-10. - Obtain urine culture and sensitivities, placed on Rocephin in ED, will continue until  sensitivities available   DVT prophylaxis: TED hoses, DVT prophylaxis per orthopedics.  CODE STATUS: Full CODE STATUS, discussed with the patient  Consults called: Orthopedics, Dr. Charlann Boxer  Family Communication: Admission, patients condition and plan of care including tests being ordered have been discussed with the patient and daughter at the bedside who indicates understanding and agree with the plan and Code Status  Admission status: Inpatient telemetry  The medical decision making on this patient was of high complexity and the patient is at high risk for clinical deterioration, therefore this is a level 3 admission.  Severity of Illness:      The appropriate patient status for this patient is INPATIENT. Inpatient status is judged to be reasonable and necessary in order to provide the required intensity of service to ensure the patient's safety. The patient's presenting symptoms, physical exam findings, and initial radiographic and laboratory data in the context of their chronic comorbidities is felt to place them at high risk for further clinical deterioration. Furthermore, it is not anticipated that the patient will be medically stable for discharge from the hospital within 2 midnights of admission. The following factors support the patient status of inpatient.   " The patient's presenting symptoms include left hip fracture, hypoxia, UTI. " The worrisome physical exam findings include left leg shortened and externally rotated, and severe pain " The initial radiographic and laboratory data are worrisome because of UTI, leukocytosis " The chronic co-morbidities include hypertension, hyperlipidemia, chronic back pain issues, Marfan syndrome   * I certify that at the point of admission it is my clinical judgment that the patient will require inpatient hospital care spanning beyond 2 midnights from the point of admission due to high intensity of service, high risk for further deterioration and  high frequency of surveillance required.*    Time Spent on Admission: 60 minutes     Gianne Shugars M.D. [facility] 10/18/2019, 10:12 AM

## 2019-10-19 ENCOUNTER — Encounter: Payer: Self-pay | Admitting: *Deleted

## 2019-10-19 LAB — BASIC METABOLIC PANEL
Anion gap: 8 (ref 5–15)
BUN: 14 mg/dL (ref 8–23)
CO2: 28 mmol/L (ref 22–32)
Calcium: 8.6 mg/dL — ABNORMAL LOW (ref 8.9–10.3)
Chloride: 106 mmol/L (ref 98–111)
Creatinine, Ser: 0.83 mg/dL (ref 0.44–1.00)
GFR calc Af Amer: >60 mL/min (ref >60)
GFR calc non Af Amer: >60 mL/min (ref >60)
Glucose, Bld: 117 mg/dL — ABNORMAL HIGH (ref 70–99)
Potassium: 4.7 mmol/L (ref 3.5–5.1)
Sodium: 142 mmol/L (ref 135–145)

## 2019-10-19 LAB — CBC
HCT: 36.6 % (ref 36.0–46.0)
Hemoglobin: 11.1 g/dL — ABNORMAL LOW (ref 12.0–15.0)
MCH: 29.5 pg (ref 26.0–34.0)
MCHC: 30.3 g/dL (ref 30.0–36.0)
MCV: 97.3 fL (ref 80.0–100.0)
Platelets: 152 10*3/uL (ref 150–400)
RBC: 3.76 MIL/uL — ABNORMAL LOW (ref 3.87–5.11)
RDW: 14.9 % (ref 11.5–15.5)
WBC: 15 10*3/uL — ABNORMAL HIGH (ref 4.0–10.5)
nRBC: 0 % (ref 0.0–0.2)

## 2019-10-19 LAB — TROPONIN I (HIGH SENSITIVITY): Troponin I (High Sensitivity): 8 ng/L (ref <18)

## 2019-10-19 MED ORDER — MENTHOL 3 MG MT LOZG
1.0000 | LOZENGE | OROMUCOSAL | Status: DC | PRN
  Filled 2019-10-19 (×2): qty 9

## 2019-10-19 MED ORDER — METOPROLOL SUCCINATE ER 50 MG PO TB24: 50.0000 mg | ORAL_TABLET | Freq: Every day | ORAL | Status: DC

## 2019-10-19 MED ORDER — DICLOFENAC EPOLAMINE 1.3 % EX PTCH
1.0000 | MEDICATED_PATCH | Freq: Two times a day (BID) | CUTANEOUS | Status: DC
  Administered 2019-10-19 – 2019-10-30 (×21): 1 via TRANSDERMAL
  Filled 2019-10-19 (×23): qty 1

## 2019-10-19 MED ORDER — PHENOL 1.4 % MT LIQD
1.0000 | OROMUCOSAL | Status: DC | PRN
  Administered 2019-10-22: 1 via OROMUCOSAL
  Filled 2019-10-19: qty 177

## 2019-10-19 MED ORDER — METOCLOPRAMIDE HCL 5 MG PO TABS: 5.0000 mg | ORAL_TABLET | Freq: Three times a day (TID) | ORAL | Status: DC | PRN

## 2019-10-19 MED ORDER — POLYETHYLENE GLYCOL 3350 17 G PO PACK
17.0000 g | PACK | Freq: Every day | ORAL | Status: DC | PRN
  Administered 2019-10-25 (×2): 17 g via ORAL
  Filled 2019-10-19 (×2): qty 1

## 2019-10-19 MED ORDER — FERROUS SULFATE 325 (65 FE) MG PO TABS
325.0000 mg | ORAL_TABLET | Freq: Three times a day (TID) | ORAL | Status: DC
  Administered 2019-10-19 – 2019-10-30 (×25): 325 mg via ORAL
  Filled 2019-10-19 (×27): qty 1

## 2019-10-19 MED ORDER — DOCUSATE SODIUM 100 MG PO CAPS
100.0000 mg | ORAL_CAPSULE | Freq: Two times a day (BID) | ORAL | Status: DC
  Administered 2019-10-19: 100 mg via ORAL
  Filled 2019-10-19: qty 1

## 2019-10-19 MED ORDER — METOCLOPRAMIDE HCL 5 MG/ML IJ SOLN: 5.0000 mg | Freq: Three times a day (TID) | INTRAMUSCULAR | Status: DC | PRN

## 2019-10-19 MED ORDER — HYDROCODONE-ACETAMINOPHEN 5-325 MG PO TABS: 1.0000 | ORAL_TABLET | ORAL | Status: DC | PRN

## 2019-10-19 MED ORDER — OXYCODONE-ACETAMINOPHEN 7.5-325 MG PO TABS
1.0000 | ORAL_TABLET | ORAL | Status: DC | PRN
  Administered 2019-10-19: 1 via ORAL
  Filled 2019-10-19: qty 1

## 2019-10-19 MED ORDER — ZINC SULFATE 220 (50 ZN) MG PO CAPS
220.0000 mg | ORAL_CAPSULE | Freq: Every day | ORAL | Status: DC
  Administered 2019-10-19 – 2019-10-30 (×12): 220 mg via ORAL
  Filled 2019-10-19 (×12): qty 1

## 2019-10-19 MED ORDER — ACETAMINOPHEN 325 MG PO TABS
325.0000 mg | ORAL_TABLET | Freq: Four times a day (QID) | ORAL | Status: DC | PRN
  Administered 2019-10-19 – 2019-10-30 (×13): 650 mg via ORAL
  Filled 2019-10-19 (×15): qty 2

## 2019-10-19 MED ORDER — METOPROLOL SUCCINATE ER 25 MG PO TB24
12.5000 mg | ORAL_TABLET | Freq: Every day | ORAL | Status: DC
  Administered 2019-10-22 – 2019-10-28 (×7): 12.5 mg via ORAL
  Filled 2019-10-19 (×11): qty 1

## 2019-10-19 MED ORDER — SODIUM CHLORIDE 0.9 % IV BOLUS
250.0000 mL | Freq: Once | INTRAVENOUS | Status: AC
  Administered 2019-10-19: 250 mL via INTRAVENOUS

## 2019-10-19 MED ORDER — ONDANSETRON HCL 4 MG PO TABS: 4.0000 mg | ORAL_TABLET | Freq: Four times a day (QID) | ORAL | Status: DC | PRN

## 2019-10-19 MED ORDER — PANTOPRAZOLE SODIUM 40 MG PO TBEC
40.0000 mg | DELAYED_RELEASE_TABLET | Freq: Every day | ORAL | Status: DC
  Administered 2019-10-19 – 2019-10-30 (×12): 40 mg via ORAL
  Filled 2019-10-19 (×12): qty 1

## 2019-10-19 MED ORDER — MORPHINE SULFATE (PF) 2 MG/ML IV SOLN: 0.5000 mg | INTRAVENOUS | Status: DC | PRN

## 2019-10-19 MED ORDER — HYDROCODONE-ACETAMINOPHEN 7.5-325 MG PO TABS: 1.0000 | ORAL_TABLET | ORAL | Status: DC | PRN

## 2019-10-19 MED ORDER — ZINC GLUCONATE 50 MG PO TABS: 50.0000 mg | ORAL_TABLET | Freq: Every day | ORAL | Status: DC

## 2019-10-19 MED ORDER — CHLORHEXIDINE GLUCONATE CLOTH 2 % EX PADS: 6.0000 | MEDICATED_PAD | Freq: Every day | CUTANEOUS | Status: DC

## 2019-10-19 MED ORDER — FUROSEMIDE 20 MG PO TABS: 20.0000 mg | ORAL_TABLET | Freq: Every day | ORAL | Status: DC

## 2019-10-19 MED ORDER — LOSARTAN POTASSIUM 50 MG PO TABS: 50.0000 mg | ORAL_TABLET | Freq: Every day | ORAL | Status: DC

## 2019-10-19 MED ORDER — ASPIRIN EC 325 MG PO TBEC
325.0000 mg | DELAYED_RELEASE_TABLET | Freq: Every day | ORAL | Status: DC
  Administered 2019-10-19 – 2019-10-30 (×12): 325 mg via ORAL
  Filled 2019-10-19 (×13): qty 1

## 2019-10-19 MED ORDER — ONDANSETRON HCL 4 MG/2ML IJ SOLN: 4.0000 mg | Freq: Four times a day (QID) | INTRAMUSCULAR | Status: DC | PRN

## 2019-10-19 MED ORDER — HYDROCODONE-ACETAMINOPHEN 7.5-325 MG PO TABS
1.0000 | ORAL_TABLET | ORAL | Status: DC | PRN
  Administered 2019-10-20 – 2019-10-21 (×3): 1 via ORAL
  Filled 2019-10-19 (×4): qty 1

## 2019-10-19 NOTE — TOC Initial Note (Signed)
Transition of Care [facility]) - Initial/Assessment Note    Patient Details  Name: Leslie Dean MRN: [id number] Date of Birth: 09-May-1946  Transition of [facility]) CM/SW Contact:    Trish Mage, LCSW Phone Number: 10/19/2019, 2:13 PM  Clinical Narrative:   Patient seen in follow up to PT recommendation of 24 hr supervision/SNF.  States she has children and grandchildren who are able to be with her 24/7 to care for her, and is not planning on going to rehab.  Gave permission for me to talk to daughter Fraser Din, who is bedside.  Fraser Din states unless her mother is able to accomplish one person transfers to [facility] or wheelchair, she cannot come home "because we are unable to lift you-we can only spot you."  Patient reluctantly agrees to allow bed search as back up plan to returning home.  Bed search initiated.  TOC will continue to follow during the course of hospitalization.             Expected Discharge Plan: [place] Barriers to Discharge: No Barriers Identified   Patient Goals and CMS Choice Patient states their goals for this hospitalization and ongoing recovery are:: "I will go home with the support of my family."      Expected Discharge Plan and Services Expected Discharge Plan: [place]   Discharge Planning Services: CM Consult Post Acute Care Choice: Durable Medical Equipment, Home Health Living arrangements for the past 2 months: Single Family Home                                      Prior Living Arrangements/Services Living arrangements for the past 2 months: Single Family Home Lives with:: Adult Children Patient language and need for interpreter reviewed:: Yes Do you feel safe going back to the place where you live?: Yes      Need for Family Participation in Patient Care: Yes (Comment) Care giver support system in place?: Yes (comment)   Criminal Activity/Legal Involvement Pertinent to Current Situation/Hospitalization: No -  Comment as needed  Activities of Daily Living Home Assistive Devices/Equipment: Eyeglasses, Dentures (specify type)(uper partial plate, single point cane) ADL Screening (condition at time of admission) Patient's cognitive ability adequate to safely complete daily activities?: Yes Is the patient deaf or have difficulty hearing?: Yes Does the patient have difficulty seeing, even when wearing glasses/contacts?: No Does the patient have difficulty concentrating, remembering, or making decisions?: No Patient able to express need for assistance with ADLs?: Yes Does the patient have difficulty dressing or bathing?: Yes Independently performs ADLs?: No Communication: Independent Dressing (OT): Needs assistance Is this a change from baseline?: Change from baseline, expected to last >3 days Grooming: Needs assistance Is this a change from baseline?: Change from baseline, expected to last >3 days Feeding: Needs assistance Is this a change from baseline?: Change from baseline, expected to last >3 days Bathing: Needs assistance Is this a change from baseline?: Change from baseline, expected to last >3 days Toileting: Dependent Is this a change from baseline?: Change from baseline, expected to last >3days In/Out Bed: Dependent Is this a change from baseline?: Change from baseline, expected to last >3 days Walks in Home: Dependent Is this a change from baseline?: Change from baseline, expected to last >3 days Does the patient have difficulty walking or climbing stairs?: Yes Weakness of Legs: Left Weakness of Arms/Hands: None  Permission  Sought/Granted                  Emotional Assessment Appearance:: Appears stated age Attitude/Demeanor/Rapport: Engaged Affect (typically observed): Appropriate Orientation: : Oriented to Self, Oriented to Place, Oriented to  Time, Oriented to Situation Alcohol / Substance Use: Not Applicable Psych Involvement: No (comment)  Admission diagnosis:  Fall  [W19.XXXA] Acute respiratory failure with hypoxia (HCC) [J96.01] Closed left hip fracture (HCC) [S72.002A] Closed fracture of left hip, initial encounter (HCC) [S72.002A] Fall, initial encounter [W19.XXXA] Patient Active Problem List   Diagnosis Date Noted  . Closed left hip fracture (HCC) 10/18/2019  . Acute respiratory failure (HCC) 10/18/2019  . Acute lower UTI 10/18/2019  . Abdominal pain 12/03/2012  . Hepatic cyst 12/03/2012  . HTN (hypertension) 12/03/2012  . Marfans syndrome 12/03/2012   PCP:  Patient, No Pcp Per Pharmacy:   [id number] - [place], [place] - [address] AT [facility] [address] [place] [address] Phone: [phone] Fax: [phone]  [facility] - [place], [place] - [address] [facility] [place] [address] Phone: [phone] Fax: [phone]     Social Determinants of Health (SDOH) Interventions    Readmission Risk Interventions No flowsheet data found.

## 2019-10-19 NOTE — Progress Notes (Signed)
Orthopedic Tech Progress Note Patient Details:  Leslie Dean [birth year] [id number]  Ortho Devices Ortho Device/Splint Location: Trapeze bar Ortho Device/Splint Interventions: Application   Post Interventions Patient Tolerated: Well Instructions Provided: Care of device   Saul Fordyce 10/19/2019, 2:12 PM

## 2019-10-19 NOTE — NC FL2 (Signed)
[place] LEVEL OF CARE SCREENING TOOL     IDENTIFICATION  Patient Name: Leslie Dean Birthdate: 26-Sep-1945 Sex: female Admission Date (Current Location): 10/18/2019  [facility] and [place] Number:  Herbalist and Address:  [facility],  [place] [address] [place]      Provider Number: [id number]  Attending Physician Name and Address:  Nita Sells, MD  Relative Name and Phone Number:  Honor Junes, daughter,[phone]    Current Level of Care: Hospital Recommended Level of Care: [place] Prior Approval Number:    Date Approved/Denied:   PASRR Number: [id number]  Discharge Plan: SNF    Current Diagnoses: Patient Active Problem List   Diagnosis Date Noted  . Closed left hip fracture ([place]) 10/18/2019  . Acute respiratory failure ([place]) 10/18/2019  . Acute lower UTI 10/18/2019  . Abdominal pain 12/03/2012  . Hepatic cyst 12/03/2012  . HTN (hypertension) 12/03/2012  . Marfans syndrome 12/03/2012    Orientation RESPIRATION BLADDER Height & Weight     Self, Time, Situation, Place  O2(2L [place]) External catheter Weight: 88.9 kg Height:  5\' 7"  (170.2 cm)  BEHAVIORAL SYMPTOMS/MOOD NEUROLOGICAL BOWEL NUTRITION STATUS  (none) (none) Incontinent Diet(see d/c summary)  AMBULATORY STATUS COMMUNICATION OF NEEDS Skin   Extensive Assist Verbally Surgical wounds(L thigh)                       Personal Care Assistance Level of Assistance  Bathing, Feeding, Dressing Bathing Assistance: Maximum assistance Feeding assistance: Independent Dressing Assistance: Maximum assistance     Functional Limitations Info  Sight, Hearing, Speech Sight Info: Adequate Hearing Info: Adequate Speech Info: Adequate    SPECIAL CARE FACTORS FREQUENCY  PT (By licensed PT), OT (By licensed OT)     PT Frequency: 5X/W OT Frequency: 5X/W            Contractures Contractures Info: Not present    Additional Factors  Info  Code Status, Allergies Code Status Info: full Allergies Info: Says opiods cause nausea           Current Medications (10/19/2019):  This is the current hospital active medication list Current Facility-Administered Medications  Medication Dose Route Frequency Provider Last Rate Last Admin  . 0.9 %  sodium chloride infusion   Intravenous Continuous Paralee Cancel, MD 75 mL/hr at 10/19/19 0020 New Bag at 10/19/19 0020  . acetaminophen (TYLENOL) tablet 325-650 mg  325-650 mg Oral Q6H PRN Paralee Cancel, MD   650 mg at 10/19/19 0141  . aspirin EC tablet 325 mg  325 mg Oral Q breakfast Paralee Cancel, MD   325 mg at 10/19/19 0919  . Chlorhexidine Gluconate Cloth 2 % PADS 6 each  6 each Topical Daily Rai, Ripudeep K, MD      . cholecalciferol (VITAMIN D) tablet 2,000 Units  2,000 Units Oral Daily Paralee Cancel, MD   2,000 Units at 10/19/19 0919  . ferrous sulfate tablet 325 mg  325 mg Oral TID Court Joy, MD   325 mg at 10/19/19 1354  . hydrALAZINE (APRESOLINE) injection 10 mg  10 mg Intravenous Q6H PRN Paralee Cancel, MD      . HYDROcodone-acetaminophen ([place]) 7.5-325 MG per tablet 1-2 tablet  1-2 tablet Oral Q4H PRN Nita Sells, MD      . magnesium oxide (MAG-OX) tablet 400 mg  400 mg Oral Daily Paralee Cancel, MD   400 mg at 10/19/19 2440  . menthol-cetylpyridinium (CEPACOL) lozenge 3  mg  1 lozenge Oral PRN Durene Romans, MD       Or  . phenol (CHLORASEPTIC) mouth spray 1 spray  1 spray Mouth/Throat PRN Durene Romans, MD      . methocarbamol (ROBAXIN) tablet 500 mg  500 mg Oral Q6H PRN Durene Romans, MD   500 mg at 10/19/19 0736  . metoCLOPramide (REGLAN) tablet 5-10 mg  5-10 mg Oral Q8H PRN Durene Romans, MD      . metoprolol succinate (TOPROL-XL) 24 hr tablet 12.5 mg  12.5 mg Oral Daily Rhetta Mura, MD      . morphine 2 MG/ML injection 0.5-1 mg  0.5-1 mg Intravenous Q2H PRN Durene Romans, MD      . ondansetron [facility]) tablet 4 mg  4 mg Oral Q6H PRN Durene Romans, MD        Or  . ondansetron [facility]) injection 4 mg  4 mg Intravenous Q6H PRN Durene Romans, MD      . oxyCODONE-acetaminophen (PERCOCET) 7.5-325 MG per tablet 1 tablet  1 tablet Oral Q4H PRN Rhetta Mura, MD   1 tablet at 10/19/19 1354  . pantoprazole (PROTONIX) EC tablet 40 mg  40 mg Oral Daily Durene Romans, MD   40 mg at 10/19/19 0918  . polyethylene glycol (MIRALAX / GLYCOLAX) packet 17 g  17 g Oral Daily PRN Durene Romans, MD      . traZODone (DESYREL) tablet 300 mg  300 mg Oral QHS Durene Romans, MD      . zinc sulfate capsule 220 mg  220 mg Oral Daily Rai, Ripudeep K, MD   220 mg at 10/19/19 0919     Discharge Medications: Please see discharge summary for a list of discharge medications.  Relevant Imaging Results:  Relevant Lab Results:   Additional Information [phone] [place], [place]

## 2019-10-19 NOTE — Progress Notes (Signed)
Patient ID: Leslie Dean, female   DOB: [birth year], 74 y.o.   MRN: [id number] Subjective: 1 Day Post-Op Procedure(s) (LRB): INTRAMEDULLARY (IM) NAIL FEMORAL (Left)    Patient reports pain as mild. Nurses report that she was somewhat sedate over night from anesthesia. Complains of left side thoracic pain No other events reported  Objective:   VITALS:   Vitals:   10/19/19 0520 10/19/19 0841  BP: (!) 90/50 98/61  Pulse: 79 (!) 103  Resp: 18   Temp: 97.9 F (36.6 C) 98.1 F (36.7 C)  SpO2: 100% 100%    Neurovascular intact Incision: dressing C/D/I  LABS Recent Labs    10/18/19 0853 10/19/19 0605  HGB 13.9 11.1*  HCT 44.5 36.6  WBC 19.7* 15.0*  PLT 218 152    Recent Labs    10/18/19 0853 10/19/19 0605  NA 141 142  K 4.3 4.7  BUN 16 14  CREATININE 0.90 0.83  GLUCOSE 156* 117*    No results for input(s): LABPT, INR in the last 72 hours.   Assessment/Plan: 1 Day Post-Op Procedure(s) (LRB): INTRAMEDULLARY (IM) NAIL FEMORAL (Left)   Advance diet Up with therapy  Disposition pending therapy assessment PWB LLE Pain control ASA for DVT prophylaxis

## 2019-10-19 NOTE — Evaluation (Addendum)
Physical Therapy Evaluation Patient Details Name: Leslie Dean MRN: [id number] DOB: [birth year] Today's Date: 10/19/2019   History of Present Illness  Patient is a 74 year old female with chronic back pain secondary to scoliosis, spinal stenosis, hypertension, Marfan syndrome, urinary incontinence presented to ED 10/18/19 with fall at home resulting in left hip pain.  sustainedreverse left obliquity intertroch fracture.S/P IM nailing 10/19/19  Clinical Impression  The patient reporting that her  Back is significantly more painful, very limiting to mobility. Patient assisted by 2 total assist using bed pad to sit on bed edge, patient resisting and citing that PT should not move her, let her move self. Patient unable to move the left leg, After sitting for ~ 3 minutes, total lift back into bed, with patient stating that she cannot tolerate being moved, that she has to move herself.  Will allow patient to attempt bed mobility next visit. Patient adamant about returning home at DC and not going to rehab., states her family will be available. Pt admitted with above diagnosis.  Pt currently with functional limitations due to the deficits listed below (see PT Problem List). Pt will benefit from skilled PT to increase their independence and safety with mobility to allow discharge to the venue listed below.    Lumbar spine xray shows no acute fracture, scoliosis present.    Follow Up Recommendations CIR;Supervision/Assistance - 24 hour, versus HHPT with max assistance. If CIR/family unavailable, then consider SNF.    Equipment Recommendations  Wheelchair (measurements PT);Wheelchair cushion (measurements PT)    Recommendations for Other Services OT consult     Precautions / Restrictions Precautions Precautions: Fall      Mobility  Bed Mobility Overal bed mobility: Needs Assistance Bed Mobility: Supine to Sit;Sit to Supine     Supine to sit: +2 for physical assistance;+2 for  safety/equipment;HOB elevated;Total assist Sit to supine: +2 for safety/equipment;+2 for physical assistance;Total assist   General bed mobility comments: patient requesting to stop moving frequently, 2 person assist to move the legs and the trunk , patient reporting severe pain in the back. Total to return to supine. Used bed pads. left on right side  Transfers                 General transfer comment: NT  Ambulation/Gait                Stairs            Wheelchair Mobility    Modified Rankin (Stroke Patients Only)       Balance Overall balance assessment: Needs assistance;History of Falls Sitting-balance support: Bilateral upper extremity supported;Feet supported Sitting balance-Leahy Scale: Poor Sitting balance - Comments: tending to lean to right                                     Pertinent Vitals/Pain Pain Assessment: 0-10 Pain Score: 10-Worst pain ever Pain Location: left back Pain Descriptors / Indicators: Cramping;Crying;Moaning Pain Intervention(s): Premedicated before session;RN gave pain meds during session;Monitored during session;Limited activity within patient's tolerance;Relaxation    Home Living Family/patient expects to be discharged to:: Private residence Living Arrangements: Children Available Help at Discharge: Available 24 hours/day Type of Home: House Home Access: Level entry     Home Layout: Two level Home Equipment: Environmental consultant - 2 wheels;Electric scooter;Bedside commode      Prior Function Level of Independence: Independent with assistive device(s)  Comments: reports uses cane or Rw or none depending on back pain, uses scooter outside at times     Hand Dominance        Extremity/Trunk Assessment   Upper Extremity Assessment Upper Extremity Assessment: Defer to OT evaluation    Lower Extremity Assessment Lower Extremity Assessment: RLE deficits/detail;LLE deficits/detail LLE Deficits /  Details: required max assist to move the leg, moves ankle WFL    Cervical / Trunk Assessment Cervical / Trunk Assessment: Other exceptions Cervical / Trunk Exceptions: Left lower back hump noted  Communication      Cognition Arousal/Alertness: Awake/alert Behavior During Therapy: WFL for tasks assessed/performed;Anxious Overall Cognitive Status: Within Functional Limits for tasks assessed                                        General Comments      Exercises     Assessment/Plan    PT Assessment Patient needs continued PT services  PT Problem List Decreased strength;Decreased balance;Decreased cognition;Decreased knowledge of precautions;Pain;Decreased range of motion;Decreased mobility;Decreased knowledge of use of DME;Decreased activity tolerance;Decreased safety awareness       PT Treatment Interventions DME instruction;Functional mobility training;Patient/family education;Gait training;Therapeutic activities;Therapeutic exercise    PT Goals (Current goals can be found in the Care Plan section)  Acute Rehab PT Goals Patient Stated Goal: to go home PT Goal Formulation: With patient Time For Goal Achievement: 11/02/19 Potential to Achieve Goals: Fair    Frequency Min 5X/week   Barriers to discharge        Co-evaluation               AM-PAC PT "6 Clicks" Mobility  Outcome Measure Help needed turning from your back to your side while in a flat bed without using bedrails?: Total Help needed moving from lying on your back to sitting on the side of a flat bed without using bedrails?: Total Help needed moving to and from a bed to a chair (including a wheelchair)?: Total Help needed standing up from a chair using your arms (e.g., wheelchair or bedside chair)?: Total Help needed to walk in hospital room?: Total Help needed climbing 3-5 steps with a railing? : Total 6 Click Score: 6    End of Session   Activity Tolerance: Patient limited by  pain Patient left: in bed;with call bell/phone within reach;with bed alarm set Nurse Communication: Mobility status PT Visit Diagnosis: Unsteadiness on feet (R26.81);Difficulty in walking, not elsewhere classified (R26.2)    Time: 1400-1433 PT Time Calculation (min) (ACUTE ONLY): 33 min   Charges:   PT Evaluation $PT Eval Moderate Complexity: 1 Mod PT Treatments $Therapeutic Activity: 8-22 mins        Blanchard Kelch PT Acute Rehabilitation Services Pager [phone] Office [phone] }  Leslie Dean 10/19/2019, 2:48 PM

## 2019-10-19 NOTE — Progress Notes (Signed)
PROGRESS NOTE STEPHENI Dean  DUK:[id number] DOB: 1945-10-12 DOA: 10/18/2019 PCP: Leslie Dean, No Pcp Per   Chief Complaint  Leslie Dean presents with  . Fall  . Hip Pain   Brief Narrative:  74 year old white female (retired Engineer, civil (consulting) used to work at [employer]) Marfan syndrome Chronic back pain with some scoliosis and on multiple meds previously not on any now HTN Prior admission large hepatic cyst 12/06/2012-(was benign) Accidental fall-immediate left hip pain 10/10-Rx NAD 200 mcg fentanyl 1 mg Dilaudid 4 mg morphine = hypoxia in ED  EKG on admit RBBB LPF B Labs pretty normal other than white count 19 on admission  Assessment & Plan:   Principal Problem:   Closed left hip fracture (HCC) Active Problems:   HTN (hypertension)   Marfans syndrome   Acute respiratory failure (HCC)   Acute lower UTI   Chest/abd pain a.m. 5/11 reported Leslie Dean's pain seems more in keeping with malpositioning-Leslie Dean has had a fractured clavicle 3 years prior and is having left shoulder pain Repeat EKG my over read shows PVCs and is as follows PR interval 0.12 QRS axis is 70 degrees PVCs in V3 RSR prime V1 V2 and this is not significantly changed from prior EKG in 2014  Iatrogenic hypoxic respiratory failure 2/2 stacking of narcotics in the ED Resolved-desat screen Try to avoid IV pain meds Adjusted pain meds as per [facility] Percocet for severe pain 7.5 Norco 1-2 for pain 4-7 Adding flector patch and Kpad--if no better consider imaging[majority of her back pain seems worsened by small bed and post op state laying in bed]  Hypotension on admission Etiology unclear hypotensive this am given bolus of 250 cc normal saline , and rate of 75cc/h RN to alert me as holding fluids 4:42 PM and see BP/MAP in 1 hour Blood counts expected given postprocedure drop from 13.9-11.1 and is not meeting for transfusion threshold at this time  Marfan syndrome, scoliosis Monitor pain and ambulate/mobilize    DVT prophylaxis:    Code Status:  Family Communication: Disposition:   Status is: Inpatient  Remains inpatient appropriate because:Hemodynamically unstable and Persistent severe electrolyte disturbances   Dispo: The Leslie Dean is from: Home              Anticipated d/c is to: SNF              Anticipated d/c date is: 3 days              Leslie Dean currently is not medically stable to d/c.    Consultants:   Ortho Dr. Charlann Boxer  Procedures:  PROCEDURE: 10/19/2019 Dr. Constance Goltz orthopedics Open reduction internal fixation of left proximal femur fracture utilizing a [employer] Affixus long nail, 11 mm x 380 mm with a 115 mm lag screw and a distal interlock.  Antimicrobials: Perioperative   Subjective: Seen this morning and nursing was concerned about chest pain EKG noted as above and Leslie Dean stabilized  Leslie Dean reviewed at 4:35 PM and although BP still low 90, MAP better condition overall better Still 8/10 LBP--has been given pain meds and also was given Kpad  Objective: Vitals:   10/19/19 0158 10/19/19 0258 10/19/19 0520 10/19/19 0841  BP: (!) 103/55 (!) 93/59 (!) 90/50 98/61  Pulse: 68 97 79 (!) 103  Resp:  18 18   Temp: 98.4 F (36.9 C) 98.2 F (36.8 C) 97.9 F (36.6 C) 98.1 F (36.7 C)  TempSrc:  Oral Oral Oral  SpO2: 100% 100% 100% 100%  Weight:  Height:        Intake/Output Summary (Last 24 hours) at 10/19/2019 0903 Last data filed at 10/19/2019 0500 Gross per 24 hour  Intake 2142.21 ml  Output 725 ml  Net 1417.21 ml   Filed Weights   10/18/19 1700  Weight: 88.9 kg    Examination:  General exam: awake alert in nad no focal deficit but in pain--on oxygen 2 liters currently Respiratory system: clear no addded sound no rales no rhonchi no wheeze  Cardiovascular system: s1 s 2 sinus//occ sinus tach Gastrointestinal system: soft nt dn no rebound. Central nervous system: intact power 5/5 no focal deficit sensory intct  Extremities: intact no focal deficit  Skin: no le edema-op site  note visualised today  Psychiatry: euthymic pleasant    Data Reviewed: I have personally reviewed following labs and imaging studies   Radiology Studies: DG Lumbar Spine 2-3 Views  Result Date: 10/18/2019 CLINICAL DATA:  Pain following fall EXAM: LUMBAR SPINE - 2-3 VIEW COMPARISON:  None. FINDINGS: Note that lateral images somewhat limited due to cross-table lateral positioning. Frontal, lateral, and spot lumbosacral lateral images were obtained. There are 5 non-rib-bearing lumbar type vertebral bodies. Bones are osteoporotic. There is marked levorotoscoliosis in the mid lumbar spine. There is bony remodeling due to the chronic scoliosis but no well-defined acute fracture. No appreciable spondylolisthesis. There is moderate disc space narrowing at all levels, exacerbated by scoliosis. There is aortic atherosclerosis. IMPRESSION: Osteoporosis. Marked scoliosis with bony remodeling throughout the lumbar region. No acute fracture or or spondylolisthesis with limitations due to the cross-table lateral imaging of the lumbar spine on lateral view as well as scoliosis. Moderate osteoarthritic change throughout the lumbar region. Aortic Atherosclerosis (ICD10-I70.0). Electronically Signed   By: Bretta Bang III M.D.   On: 10/18/2019 08:24   DG Chest Port 1 View  Result Date: 10/18/2019 CLINICAL DATA:  Preop left hip fracture EXAM: PORTABLE CHEST 1 VIEW COMPARISON:  None. FINDINGS: There are bilateral chronic bronchitic changes. There is no focal consolidation. There is no pleural effusion or pneumothorax. The heart and mediastinal contours are unremarkable. There is no acute osseous abnormality. IMPRESSION: No active disease. Electronically Signed   By: Elige Ko   On: 10/18/2019 11:07   DG C-Arm 1-60 Min-No Report  Result Date: 10/18/2019 Fluoroscopy was utilized by the requesting physician.  No radiographic interpretation.   DG Hip Unilat W or Wo Pelvis 2-3 Views Left  Result Date:  10/18/2019 CLINICAL DATA:  Pain following fall EXAM: DG HIP (WITH OR WITHOUT PELVIS) 2-3V LEFT COMPARISON:  None. FINDINGS: Frontal pelvis as well as frontal and lateral left hip images were obtained. There is a comminuted intertrochanteric femur fracture on the left with varus angulation at the fracture site. There is impaction of the proximal femoral shaft into the greater trochanter. No other fractures are evident. No dislocation. Bones are diffusely osteoporotic. There is severe narrowing of each hip joint. There is an apparent pessary type device in the lower pelvic region. IMPRESSION: Comminuted intertrochanteric femur fracture on the left with impaction at the fracture site and varus angulation. Severe narrowing of each hip joint noted. No dislocation. Underlying osteoporosis. Electronically Signed   By: Bretta Bang III M.D.   On: 10/18/2019 08:21   DG FEMUR MIN 2 VIEWS LEFT  Result Date: 10/18/2019 CLINICAL DATA:  Left femoral fracture EXAM: LEFT FEMUR 2 VIEWS COMPARISON:  10/18/2019 FLUOROSCOPY TIME:  Radiation Exposure Index (as provided by the fluoroscopic device): 21.1 mGy If the device does  not provide the exposure index: Fluoroscopy Time:  1 minutes 24 seconds Number of Acquired Images:  3 FINDINGS: Medullary rod is noted in the proximal femur with fixation screw traversing the femoral neck. Fracture fragments are in near anatomic alignment. IMPRESSION: ORIF of left femoral fracture. Electronically Signed   By: Inez Catalina M.D.   On: 10/18/2019 22:55   DG Femur Min 2 Views Left  Result Date: 10/18/2019 CLINICAL DATA:  Pain following fall EXAM: LEFT FEMUR 2 VIEWS COMPARISON:  Pelvis and left hip radiographs Oct 18, 2019 FINDINGS: Frontal and lateral views obtained. Comminuted fracture of the intertrochanteric left femur described in pelvis and left hip report. More distally, no fracture or dislocation. No knee joint effusion. Moderate narrowing medially in the knee region. Marked  narrowing left hip region. IMPRESSION: Comminuted intertrochanteric femur fracture proximally with varus angulation at the fracture site. More distally, no evident fracture. No dislocation. There is osteoarthritic change in the left hip and knee joint regions. Electronically Signed   By: Lowella Grip III M.D.   On: 10/18/2019 08:25      Scheduled Meds: . aspirin EC  325 mg Oral Q breakfast  . Chlorhexidine Gluconate Cloth  6 each Topical Daily  . cholecalciferol  2,000 Units Oral Daily  . docusate sodium  100 mg Oral BID  . ferrous sulfate  325 mg Oral TID PC  . magnesium oxide  400 mg Oral Daily  . metoprolol succinate  12.5 mg Oral Daily  . pantoprazole  40 mg Oral Daily  . trazodone  300 mg Oral QHS  . zinc sulfate  220 mg Oral Daily   Continuous Infusions: . sodium chloride 75 mL/hr at 10/19/19 0020  .  ceFAZolin (ANCEF) IV 2 g (10/19/19 0350)  . cefTRIAXone (ROCEPHIN)  IV    . methocarbamol (ROBAXIN) IV       LOS: 1 day    Time spent: 55 + 30 min critical time  CRITICAL CARE Performed by: Nita Sells   Total critical care time: 30 minutes  Critical care time was exclusive of separately billable procedures and treating other patients.  Critical care was necessary to treat or prevent imminent or life-threatening deterioration.  Critical care was time spent personally by me on the following activities: development of treatment plan with Leslie Dean and/or surrogate as well as nursing, discussions with consultants, evaluation of Leslie Dean's response to treatment, examination of Leslie Dean, obtaining history from Leslie Dean or surrogate, ordering and performing treatments and interventions, ordering and review of laboratory studies, ordering and review of radiographic studies, pulse oximetry and re-evaluation of Leslie Dean's condition.     Nita Sells, MD [facility]   To contact the attending provider between 7A-7P or the covering provider during after  hours 7P-7A, please log into the web site [email or web address] and access using universal [place] password for that web site. If you do not have the password, please call the hospital operator.  10/19/2019, 9:03 AM

## 2019-10-19 NOTE — Progress Notes (Signed)
RN noted 90/60 BP prior to shift change. Reassessed BP and notified MD. 250 ml bolus ordered and administered. Reassessed vitals following bolus, bp slightly improved but remains hypotensive at 98/61, HR noted to increased to 103 bpm. MD notified face to face. Pt begin c/o left sided chest pain radiating to left shoulder. EKG obtained and hand delivered to MD present at bedside. Troponin level ordered. Will continue to monitor and notify MD of any changes or abnormal troponin result.

## 2019-10-19 NOTE — Op Note (Signed)
NAME: Leslie Dean, Leslie Dean MEDICAL RECORD [id number] ACCOUNT [id number] DATE OF BIRTH:February 10, 1946 FACILITY: [facility] LOCATION: [id number] PHYSICIAN:Dotty Gonzalo DCharlann Boxer, MD  OPERATIVE REPORT  DATE OF PROCEDURE:  10/18/2019  PREOPERATIVE DIAGNOSIS:  Left proximal femur, reverse obliquity intertrochanteric femur fracture.  POSTOPERATIVE DIAGNOSIS:  Left proximal femur, reverse obliquity intertrochanteric femur fracture.  PROCEDURE:  Open reduction internal fixation of left proximal femur fracture utilizing a [employer] Affixus long nail, 11 mm x 380 mm with a 115 mm lag screw and a distal interlock.  SURGEON:  Durene Romans, MD  ASSISTANT:  Surgical team  ANESTHESIA:  General.  SPECIMENS:  None.  COMPLICATIONS:  None.  BLOOD LOSS:  Less than 200 mL.  INDICATIONS FOR PROCEDURE:  The patient is a 74 year old female who unfortunately had a ground level fall from her bed.  She had immediate onset of pain and inability to bear weight.  She was brought to the emergency room where radiographs revealed  complex proximal femur fracture.  Orthopedics was consulted for management.  The risks and benefits and necessity of the procedure were reviewed and discussed.  Consent was obtained for benefit of pain relief.  DESCRIPTION OF PROCEDURE:  The patient was brought to the operative theater.  Once adequate anesthesia, preoperative antibiotics, Ancef administered, as well as tranexamic acid, she was positioned supine on the fracture table.  All bony prominences were  carefully padded.  The unaffected right lower extremity was flexed and abducted out of the well leg holder again with bony prominences padded particularly over the peroneal nerve region and fibular head.  The left foot was placed in a traction boot.   Once positioned with a perineal post, traction was applied.  Fluoroscopy was used to evaluate the fracture pattern and confirm maneuvers.  Once this was done, the left hip was prepped and draped in  sterile fashion for planned perfect circle technique for  distal interlock.  The hip was prepped and draped with a shower curtain technique.  Fluoroscopy was brought back to the field.  The tip of the trochanter was identified.  An incision was made proximal to this.  Soft tissue dissection was carried down  through the gluteal fascia.  The guidewire was then inserted into the tip of the trochanter and then passed across the fracture site and confirmed radiographically.  I then drilled open the proximal femur and passed the long ball-tipped guidewire.  We  measured for a 380 mm nail.  At this point, I made an incision where the proximal lag screw would go.  I did this in order to reduce the reverse obliquity segment of the laterally displaced greater trochanter segment.  We used an instrument to hold this  into an anatomic position as I reamed the femur up to a 12.5 mm reamer.  We selected and prepared for the 11 x 300 mm nail, which was then passed by hand to its appropriate depth while maintaining the reverse obliquity segment into an anatomic position.   Once the nail was at its appropriate depth, we used a 3-in-1 guide and placed in a guidewire into the center of the head in AP and lateral planes while maintaining reduction of the fracture site.  We selected a 115 mm lag screw.  I drilled for this  depth for the lag screw.  The 115 mm lag screw was then passed by hand.  Based on the nature of this fracture pattern, I then locked this down as a fixed angle device to prevent further compression  through the obliquity segment.  Once this was done, we  removed the proximal insertion jig.  Traction was taken off the lower extremity.  Fluoroscopy was then used to put a distal interlock in the static position with the perfect circle technique.  This was accomplished without challenge.  The final  radiographs were obtained in AP and lateral planes.  We then irrigated all the wounds.  The proximal wounds were  closed in layers with the gluteal fascia reapproximated using #1 Vicryl.  The iliotibial band, similarly at the distal segment.  The  remainder of the wound was closed with 2-0 Vicryl and then staples on the skin.  The skin was clean, dry and dressed sterilely using a long Mepilex dressing.  The distal interlock was closed with staples and a Mepilex.  She was then extubated and brought  to the recovery room in stable condition.  Findings were reviewed with family.  Likely I will have her be partial weightbearing for at least the first 4-6 weeks to make sure we get fracture healing before progression.  We will place her on oral DVT  prophylaxis with aspirin likely.  CN/NUANCE  D:10/18/2019 T:10/19/2019 JOB:[id number]

## 2019-10-19 NOTE — Progress Notes (Signed)
Rehab Admissions Coordinator Note:  Patient was screened by Clois Dupes for appropriateness for an Inpatient Acute Rehab Consult per PT recommendation. Patient has not demonstrated the level to tolerate the intensity of therapy required of a possible inpt rehab admit at this time nor the medial neccesity for an inpt rehab admission. Please call me with any questions.  Clois Dupes RN MSN 10/19/2019, 2:59 PM  I can be reached at [phone].

## 2019-10-19 NOTE — Anesthesia Postprocedure Evaluation (Signed)
Anesthesia Post Note  Patient: Leslie Dean  Procedure(s) Performed: INTRAMEDULLARY (IM) NAIL FEMORAL (Left )     Patient location during evaluation: PACU Anesthesia Type: Spinal Level of consciousness: awake and alert, oriented and patient cooperative Pain management: pain level controlled Vital Signs Assessment: post-procedure vital signs reviewed and stable Respiratory status: spontaneous breathing, nonlabored ventilation, respiratory function stable and patient connected to nasal cannula oxygen Cardiovascular status: blood pressure returned to baseline and stable Postop Assessment: no apparent nausea or vomiting Anesthetic complications: no    Last Vitals:  Vitals:   10/18/19 2330 10/18/19 2345  BP: (!) 129/49 (!) 114/59  Pulse: 89 78  Resp: 17 15  Temp:    SpO2: 93% 95%    Last Pain:  Vitals:   10/18/19 2350  TempSrc:   PainSc: 0-No pain                 Debara Kamphuis,E. Jaylinn Hellenbrand

## 2019-10-20 DIAGNOSIS — W19XXXA Unspecified fall, initial encounter: Secondary | ICD-10-CM

## 2019-10-20 DIAGNOSIS — I10 Essential (primary) hypertension: Secondary | ICD-10-CM

## 2019-10-20 DIAGNOSIS — Q8740 Marfan's syndrome, unspecified: Secondary | ICD-10-CM

## 2019-10-20 LAB — CBC
HCT: 33.2 % — ABNORMAL LOW (ref 36.0–46.0)
Hemoglobin: 10.2 g/dL — ABNORMAL LOW (ref 12.0–15.0)
MCH: 29.6 pg (ref 26.0–34.0)
MCHC: 30.7 g/dL (ref 30.0–36.0)
MCV: 96.2 fL (ref 80.0–100.0)
Platelets: 138 10*3/uL — ABNORMAL LOW (ref 150–400)
RBC: 3.45 MIL/uL — ABNORMAL LOW (ref 3.87–5.11)
RDW: 15 % (ref 11.5–15.5)
WBC: 11.8 10*3/uL — ABNORMAL HIGH (ref 4.0–10.5)
nRBC: 0 % (ref 0.0–0.2)

## 2019-10-20 LAB — BASIC METABOLIC PANEL
Anion gap: 7 (ref 5–15)
BUN: 9 mg/dL (ref 8–23)
CO2: 26 mmol/L (ref 22–32)
Calcium: 9 mg/dL (ref 8.9–10.3)
Chloride: 107 mmol/L (ref 98–111)
Creatinine, Ser: 0.75 mg/dL (ref 0.44–1.00)
GFR calc Af Amer: >60 mL/min (ref >60)
GFR calc non Af Amer: >60 mL/min (ref >60)
Glucose, Bld: 123 mg/dL — ABNORMAL HIGH (ref 70–99)
Potassium: 4.4 mmol/L (ref 3.5–5.1)
Sodium: 140 mmol/L (ref 135–145)

## 2019-10-20 LAB — SURGICAL PCR SCREEN
MRSA, PCR: NEGATIVE
Staphylococcus aureus: POSITIVE — AB

## 2019-10-20 LAB — VITAMIN D 25 HYDROXY (VIT D DEFICIENCY, FRACTURES): Vit D, 25-Hydroxy: 39.3 ng/mL (ref 30–100)

## 2019-10-20 MED ORDER — ASPIRIN 81 MG PO CHEW: 81.0000 mg | CHEWABLE_TABLET | Freq: Two times a day (BID) | ORAL | 0 refills | Status: AC

## 2019-10-20 MED ORDER — MUPIROCIN 2 % EX OINT: 1.0000 "application " | TOPICAL_OINTMENT | Freq: Two times a day (BID) | CUTANEOUS | Status: DC

## 2019-10-20 MED ORDER — HYDROCODONE-ACETAMINOPHEN 5-325 MG PO TABS: 1.0000 | ORAL_TABLET | Freq: Four times a day (QID) | ORAL | 0 refills | Status: DC | PRN

## 2019-10-20 MED ORDER — SODIUM CHLORIDE 0.9 % IV BOLUS
250.0000 mL | Freq: Once | INTRAVENOUS | Status: AC
  Administered 2019-10-20: 250 mL via INTRAVENOUS

## 2019-10-20 NOTE — Evaluation (Signed)
Occupational Therapy Evaluation Patient Details Name: Leslie Dean MRN: [id number] DOB: 1945-08-24 Today's Date: 10/20/2019    History of Present Illness Patient is a 74 year old female with chronic back pain secondary to scoliosis, spinal stenosis, hypertension, Marfan syndrome, urinary incontinence presented to ED 10/18/19 with fall at home resulting in left hip pain.  sustainedreverse left obliquity intertroch fracture.S/P IM nailing 10/19/19   Clinical Impression   Patient with functional deficits listed below impacting safety and independence with self care. Provide extensive education and encouragement regarding importance of mobility with patient voicing agreement/understanding however with PT/OT attempting to pivot patient to sitting edge of bed patient uses UEs to resist and calls out please don't. Transitioned patient to chair position in bed level to increase tolerance of flexion to L hip/position changes in preparation for mobility. Patient's DTR present and very supportive/encouraging. Will continue to follow.     Follow Up Recommendations  SNF    Equipment Recommendations  Other (comment)(TBD)       Precautions / Restrictions Precautions Precautions: Fall Precaution Comments: patient grabs at  persons helping Restrictions Weight Bearing Restrictions: Yes LLE Weight Bearing: Partial weight bearing LLE Partial Weight Bearing Percentage or Pounds: 50      Mobility Bed Mobility Overal bed mobility: Needs Assistance Bed Mobility: Supine to Sit     Supine to sit: +2 for physical assistance;+2 for safety/equipment;Total assist     General bed mobility comments: made an effort to move patient's legs toward bed edge and bigin lifting trunk. Patient grabbed therapist's arm, grabbed rails and resisted. . Repositioned  pt up toward [facility] with much effort, then gradually placed the bed in slight chair position.  Transfers                 General transfer comment:  NT    Balance Overall balance assessment: Needs assistance;History of Falls     Sitting balance - Comments: NT-did not sit EOB                                   ADL either performed or assessed with clinical judgement   ADL Overall ADL's : Needs assistance/impaired Eating/Feeding: Set up;Bed level Eating/Feeding Details (indicate cue type and reason): pt able to bring cup to mouth Grooming: Wash/dry face;Wash/dry hands;Set up;Bed level   Upper Body Bathing: Moderate assistance;Bed level   Lower Body Bathing: Total assistance;Bed level   Upper Body Dressing : Moderate assistance;Maximal assistance;Bed level   Lower Body Dressing: Total assistance;Bed level   Toilet Transfer: Total assistance Toilet Transfer Details (indicate cue type and reason): very resistive to attempt sitting at edge of bed, tenses up muscles and calls out to stop. may require x2-3 assist to attempt when able Toileting- Clothing Manipulation and Hygiene: Total assistance         General ADL Comments: patient requires extensive assist with ADLs due to significant pain, anxiety, groggy with limited ability to keep eyes open     Vision Baseline Vision/History: Wears glasses Wears Glasses: At all times              Pertinent Vitals/Pain Pain Assessment: Faces Faces Pain Scale: Hurts worst Pain Location: left back and left thigh/hip Pain Descriptors / Indicators: Discomfort;Burning;Moaning Pain Intervention(s): Monitored during session     Hand Dominance Right   Extremity/Trunk Assessment Upper Extremity Assessment Upper Extremity Assessment: Overall WFL for tasks assessed(patient resists mobility with UEs demonstrating AFL strength)  Lower Extremity Assessment Lower Extremity Assessment: Defer to PT evaluation       Communication Communication Communication: No difficulties   Cognition Arousal/Alertness: Lethargic Behavior During Therapy: Anxious;Restless Overall  Cognitive Status: Within Functional Limits for tasks assessed                                 General Comments: very drowsy, hard to keep eyes open   General Comments  pt desaturate to 87% on room air in bed therefore O2 donned for remainder of session            [place] expects to be discharged to:: Private residence Living Arrangements: Children Available Help at Discharge: Available 24 hours/day Type of Home: House Home Access: Level entry     Home Layout: Two level Alternate Level Stairs-Number of Steps: 1 and 2 to get to [employer] [facility] [employer] Toilet: Handicapped height     Home Equipment: Environmental consultant - 2 wheels;Electric scooter;Bedside commode          Prior Functioning/Environment Level of Independence: Independent with assistive device(s)        Comments: reports uses cane or Rw or none depending on back pain, uses scooter outside at times        OT Problem List: Decreased activity tolerance;Impaired balance (sitting and/or standing);Decreased safety awareness;Decreased knowledge of use of DME or AE;Obesity;Pain      OT Treatment/Interventions: Self-care/ADL training;Therapeutic exercise;Energy conservation;DME and/or AE instruction;Therapeutic activities;Patient/family education;Balance training    OT Goals(Current goals can be found in the care plan section) Acute Rehab OT Goals Patient Stated Goal: to go home OT Goal Formulation: With patient Time For Goal Achievement: 11/03/19 Potential to Achieve Goals: Good  OT Frequency: Min 2X/week           Co-evaluation PT/OT/SLP Co-Evaluation/Treatment: Yes Reason for Co-Treatment: Complexity of the patient's impairments (multi-system involvement);To address functional/ADL transfers;For patient/therapist safety;Necessary to address cognition/behavior during functional activity PT goals addressed during session: Mobility/safety with mobility OT goals addressed during session:  ADL's and self-care      AM-PAC OT "6 Clicks" Daily Activity     Outcome Measure Help from another person eating meals?: A Little Help from another person taking care of personal grooming?: A Little Help from another person toileting, which includes using toliet, bedpan, or urinal?: Total Help from another person bathing (including washing, rinsing, drying)?: Total Help from another person to put on and taking off regular upper body clothing?: A Lot Help from another person to put on and taking off regular lower body clothing?: Total 6 Click Score: 11   End of Session Equipment Utilized During Treatment: Oxygen Nurse Communication: Mobility status  Activity Tolerance: Patient limited by pain;Treatment limited secondary to agitation Patient left: in bed;with call bell/phone within reach;with bed alarm set;with family/visitor present  OT Visit Diagnosis: Other abnormalities of gait and mobility (R26.89);History of falling (Z91.81);Pain Pain - Right/Left: Left Pain - part of body: Hip                Time: 8101-7510 OT Time Calculation (min): 34 min Charges:  OT General Charges $OT Visit: 1 Visit OT Evaluation $OT Eval Moderate Complexity: [place] OT Pager: [place] 10/20/2019, 3:10 PM

## 2019-10-20 NOTE — Progress Notes (Signed)
Physical Therapy Treatment Patient Details Name: Leslie Dean MRN: [id number] DOB: 1945/08/11 Today's Date: 10/20/2019    History of Present Illness Patient is a 74 year old female with chronic back pain secondary to scoliosis, spinal stenosis, hypertension, Marfan syndrome, urinary incontinence presented to ED 10/18/19 with fall at home resulting in left hip pain.  sustainedreverse left obliquity intertroch fracture.S/P IM nailing 10/19/19    PT Comments    Patient resistive  To attempts to mobilize to a sitting position on bed edge. Therefore, repositioned and placed patient in slight chair position of the bed. Patient's SPO2 87 on RA, , 93% on 2 L. Continue PT for  Progressive mobility.   Follow Up Recommendations  Home health PT;SNF;Supervision/Assistance - 24 hour     Equipment Recommendations  Wheelchair (measurements PT);Wheelchair cushion (measurements PT)    Recommendations for Other Services       Precautions / Restrictions Precautions Precautions: Fall Precaution Comments: patient grabs at  persons helping Restrictions LLE Weight Bearing: Partial weight bearing LLE Partial Weight Bearing Percentage or Pounds: 50    Mobility  Bed Mobility Overal bed mobility: Needs Assistance Bed Mobility: Supine to Sit     Supine to sit: +2 for safety/equipment;+2 for physical assistance;Total assist     General bed mobility comments: mad an effort  to move  patient;'s legs toward bed edge and bigin lifting trunk. Patient grabbed therapist's arm, grabbed rails and resisted. . Repositioned  pt up toward [facility] with much effort, then gradually placed the bed in slight chair position.  Transfers                    Ambulation/Gait                 Stairs             Wheelchair Mobility    Modified Rankin (Stroke Patients Only)       Balance       Sitting balance - Comments: NT-did not sit EOB                                     Cognition Arousal/Alertness: Lethargic Behavior During Therapy: Anxious;Restless                                   General Comments: very drowsy, hard to keep eyes open      Exercises      General Comments        Pertinent Vitals/Pain Pain Assessment: Faces Faces Pain Scale: Hurts worst Pain Location: left back and left thigh/hip Pain Descriptors / Indicators: Discomfort;Burning;Moaning Pain Intervention(s): Limited activity within patient's tolerance;Monitored during session;Premedicated before session;Repositioned    Home Living                      Prior Function            PT Goals (current goals can now be found in the care plan section) Progress towards PT goals: Not progressing toward goals - comment(due to c/o pain and physical  resistance to mobility)    Frequency    Min 5X/week      PT Plan Discharge plan needs to be updated;Current plan remains appropriate    Co-evaluation PT/OT/SLP Co-Evaluation/Treatment: Yes Reason for Co-Treatment: Complexity of the patient's impairments (multi-system involvement);Necessary to address cognition/behavior  during functional activity;For patient/therapist safety;To address functional/ADL transfers PT goals addressed during session: Mobility/safety with mobility OT goals addressed during session: ADL's and self-care      AM-PAC PT "6 Clicks" Mobility   Outcome Measure  Help needed turning from your back to your side while in a flat bed without using bedrails?: Total Help needed moving from lying on your back to sitting on the side of a flat bed without using bedrails?: Total Help needed moving to and from a bed to a chair (including a wheelchair)?: Total Help needed standing up from a chair using your arms (e.g., wheelchair or bedside chair)?: Total Help needed to walk in hospital room?: Total Help needed climbing 3-5 steps with a railing? : Total 6 Click Score: 6    End of Session  Equipment Utilized During Treatment: Oxygen Activity Tolerance: Patient limited by pain;Patient limited by lethargy Patient left: in bed;with call bell/phone within reach;with bed alarm set;with family/visitor present Nurse Communication: Mobility status;Need for lift equipment PT Visit Diagnosis: Unsteadiness on feet (R26.81);Difficulty in walking, not elsewhere classified (R26.2)     Time: 3810-1751 PT Time Calculation (min) (ACUTE ONLY): 36 min  Charges:  $Therapeutic Activity: 8-22 mins                    Tresa Endo PT Acute Rehabilitation Services Pager [phone] Office [phone]    Leslie Dean 10/20/2019, 1:38 PM

## 2019-10-20 NOTE — Progress Notes (Signed)
Spoke with MD about BP recheck. Patient is very drowsy. No other symptoms per patient.  Resume the IVF fluids and CBC to be checked in am. Continue to monitor blood pressure and notify if BP decreases again.

## 2019-10-20 NOTE — Progress Notes (Signed)
MD notified of blood pressure. 2nd 250 ml bolus ordered and being administered.

## 2019-10-20 NOTE — Plan of Care (Signed)
  Problem: Education: Goal: Knowledge of General Education information will improve Description: Including pain rating scale, medication(s)/side effects and non-pharmacologic comfort measures Outcome: Progressing   Problem: Health Behavior/Discharge Planning: Goal: Ability to manage health-related needs will improve Outcome: Progressing   Problem: Activity: Goal: Risk for activity intolerance will decrease Outcome: Progressing   

## 2019-10-20 NOTE — Progress Notes (Signed)
Physical Therapy Treatment Patient Details Name: Leslie Dean MRN: [id number] DOB: March 30, 1946 Today's Date: 10/20/2019    History of Present Illness Patient is a 74 year old female with chronic back pain secondary to scoliosis, spinal stenosis, hypertension, Marfan syndrome, urinary incontinence presented to ED 10/18/19 with fall at home resulting in left hip pain.  sustainedreverse left obliquity intertroch fracture.S/P IM nailing 10/19/19    PT Comments    The patient is very drowsy, just took medications recently. Patient is very resistive to any movement, flails arms and grabs helpers, grabs rails and prevents any position changes. Able to get the soiled pads out from under patient and replace with clean pad. Patient's daughter present and encouraging patient. Patient frequently wants"to wait a minute" and a very difficult time repositioning  Patient this visit.   Follow Up Recommendations  Home health PT;SNF;Supervision/Assistance - 24 hour, depending on progress--- per CIR, not a candidate     Equipment Recommendations  Wheelchair (measurements PT);Wheelchair cushion (measurements PT)    Recommendations for Other Services       Precautions / Restrictions Precautions Precautions: Fall Restrictions LLE Weight Bearing: Partial weight bearing LLE Partial Weight Bearing Percentage or Pounds: 50    Mobility  Bed Mobility Overal bed mobility: Needs Assistance Bed Mobility: Rolling           General bed mobility comments: patient resisting by holding rails, at times grabs anyone near, gripping tightly , unable to fully turn, taking 3 to assist due to pt. resisting  Transfers                    Ambulation/Gait                 Stairs             Wheelchair Mobility    Modified Rankin (Stroke Patients Only)       Balance                                            Cognition Arousal/Alertness: Lethargic Behavior During  Therapy: Anxious;Restless                                   General Comments: very drowsy, hard to keep eyes open      Exercises      General Comments        Pertinent Vitals/Pain Pain Assessment: Faces Faces Pain Scale: Hurts worst Pain Location: left back and left thigh/hip Pain Descriptors / Indicators: Cramping;Moaning Pain Intervention(s): Monitored during session;RN gave pain meds during session;Limited activity within patient's tolerance    Home Living                      Prior Function            PT Goals (current goals can now be found in the care plan section) Progress towards PT goals: Progressing toward goals    Frequency    Min 5X/week      PT Plan Discharge plan needs to be updated    Co-evaluation              AM-PAC PT "6 Clicks" Mobility   Outcome Measure  Help needed turning from your back to your side while in a flat bed without using  bedrails?: Total Help needed moving from lying on your back to sitting on the side of a flat bed without using bedrails?: Total Help needed moving to and from a bed to a chair (including a wheelchair)?: Total Help needed standing up from a chair using your arms (e.g., wheelchair or bedside chair)?: Total Help needed to walk in hospital room?: Total Help needed climbing 3-5 steps with a railing? : Total 6 Click Score: 6    End of Session Equipment Utilized During Treatment: Oxygen Activity Tolerance: Patient limited by pain;Patient limited by lethargy Patient left: in bed;with call bell/phone within reach;with bed alarm set;with family/visitor present Nurse Communication: Mobility status;Need for lift equipment PT Visit Diagnosis: Unsteadiness on feet (R26.81);Difficulty in walking, not elsewhere classified (R26.2)     Time: 5188-4166 PT Time Calculation (min) (ACUTE ONLY): 28 min  Charges:  $Therapeutic Activity: 23-37 mins                     Leslie Dean PT Acute  Rehabilitation Services Pager [phone] Office [phone]    Rada Hay 10/20/2019, 1:24 PM

## 2019-10-20 NOTE — Progress Notes (Signed)
MD notified of blood pressure 103/45

## 2019-10-20 NOTE — Progress Notes (Signed)
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  States LEFT hip pain under control.   Assessment & Plan:   Principal Problem:   Closed left hip fracture (HCC) Active Problems:   HTN (hypertension)   Marfans syndrome   Acute respiratory failure (HCC)   Acute lower UTI   LEFT proximal femur fracture -S/p 5/10 open reduction, internal fixation.  See procedure below  -CIR has evaluated patient and declined admission.   Chest/abd pain a.m. 5/11 reported Patient's pain seems more in keeping with malpositioning-patient has had a fractured clavicle 3 years prior and is having left shoulder pain Repeat EKG my over read shows PVCs and is as follows PR interval 0.12 QRS axis is 70 degrees PVCs in V3 RSR prime V1 V2 and this is not significantly changed from prior EKG in 2014  Iatrogenic hypoxic respiratory failure 2/2 stacking of narcotics in the ED Resolved-desat screen Try to avoid IV pain meds Adjusted pain meds as per [facility] Percocet for severe pain 7.5 Norco 1-2 for pain 4-7 Adding flector patch and Kpad--if no better consider imaging[majority of her back pain seems worsened by small bed and post op state laying in bed]  Hypotension on admission Etiology unclear hypotensive this am given bolus of 250 cc normal saline , and rate of 75cc/h RN to alert me as holding fluids 4:42 PM and see BP/MAP in 1 hour Blood  counts expected given postprocedure drop from 13.9-11.1 and is not meeting for transfusion threshold at this time  Marfan syndrome, scoliosis Monitor pain and ambulate/mobilize   DVT prophylaxis: ASA 81 mg per orthopedic surgery Code Status: Full Family Communication: 5/12 daughter at bedside discussed plan of care answered all questions Disposition Plan: 5/12 consult LCSW patient will require SNF placement as CIR has declined admission. Status is: Inpatient  Dispo: The patient is from: Home              Anticipated d/c is to: CIR              Anticipated d/c date is:??              Patient currently unstable      Consultants:  Orthopedic surgery Dr. Durene Romans :  Procedures/Significant Events:  5/10 Open reduction internal fixation of left proximal femur fracture utilizing a [employer] Aff[employer] long nail, 11 mm x 380 mm with a 115 mm lag screw and a distal interlock.    I have personally reviewed and interpreted all radiology studies and my findings are as above.  VENTILATOR SETTINGS:    Cultures   Antimicrobials:    Devices    LINES / TUBES:      Continuous Infusions: . sodium chloride 75 mL/hr at 10/20/19 0359     Objective: Vitals:   10/20/19 0215 10/20/19 0451 10/20/19 0651 10/20/19 0949  BP: (!) 107/49 (!) 97/51 115/68 (!) 110/41  Pulse: 80 83 81  71  Resp: 20 20 (!) 22   Temp: 98.3 F (36.8 C) 98.6 F (37 C)    TempSrc: Oral Oral    SpO2: 96% 96% 96%   Weight:      Height:        Intake/Output Summary (Last 24 hours) at 10/20/2019 1138 Last data filed at 10/20/2019 0454 Gross per 24 hour  Intake 759.35 ml  Output 850 ml  Net -90.65 ml   Filed Weights   10/18/19 1700  Weight: 88.9 kg    Examination:  General: Sleepy but arousable A/O x4, no acute respiratory distress Eyes: negative scleral hemorrhage, negative anisocoria, negative icterus ENT: Negative Runny nose, negative gingival bleeding, Neck:  Negative scars, masses,  torticollis, lymphadenopathy, JVD Lungs: Clear to auscultation bilaterally without wheezes or crackles Cardiovascular: Regular rate and rhythm without murmur gallop or rub normal S1 and S2 Abdomen: OBESE, negative abdominal pain, nondistended, positive soft, bowel sounds, no rebound, no ascites, no appreciable mass Extremities: No significant cyanosis, clubbing.  Left thigh/hip swollen, negative warmth to touch (appropriately so post surgery) Skin: Negative rashes, lesions, ulcers Psychiatric:  Negative depression, negative anxiety, negative fatigue, negative mania  Central nervous system:  Cranial nerves II through XII intact, tongue/uvula midline, all extremities muscle strength 5/5, sensation intact throughout,  negative dysarthria, negative expressive aphasia, negative receptive aphasia.  .     Data Reviewed: Care during the described time interval was provided by me .  I have reviewed this patient's available data, including medical history, events of note, physical examination, and all test results as part of my evaluation.  CBC: Recent Labs  Lab 10/18/19 0853 10/19/19 0605 10/20/19 0537  WBC 19.7* 15.0* 11.8*  NEUTROABS 17.4*  --   --   HGB 13.9 11.1* 10.2*  HCT 44.5 36.6 33.2*  MCV 94.3 97.3 96.2  PLT 218 152 710*   Basic Metabolic Panel: Recent Labs  Lab 10/18/19 0853 10/19/19 0605 10/20/19 0537  NA 141 142 140  K 4.3 4.7 4.4  CL 105 106 107  CO2 26 28 26   GLUCOSE 156* 117* 123*  BUN 16 14 9   CREATININE 0.90 0.83 0.75  CALCIUM 9.3 8.6* 9.0   GFR: Estimated Creatinine Clearance: 71.7 mL/min (by C-G formula based on SCr of 0.75 mg/dL). Liver Function Tests: Recent Labs  Lab 10/18/19 1844  ALBUMIN 3.4*   No results for input(s): LIPASE, AMYLASE in the last 168 hours. No results for input(s): AMMONIA in the last 168 hours. Coagulation Profile: No results for input(s): INR, PROTIME in the last 168 hours. Cardiac Enzymes: Recent Labs  Lab 10/18/19 1115    CKTOTAL 230   BNP (last 3 results) No results for input(s): PROBNP in the last 8760 hours. HbA1C: No results for input(s): HGBA1C in the last 72 hours. CBG: No results for input(s): GLUCAP in the last 168 hours. Lipid Profile: No results for input(s): CHOL, HDL, LDLCALC, TRIG, CHOLHDL, LDLDIRECT in the last 72 hours. Thyroid Function Tests: No results for input(s): TSH, T4TOTAL, FREET4, T3FREE, THYROIDAB in the last 72 hours. Anemia Panel: No results for input(s): VITAMINB12, FOLATE, FERRITIN, TIBC, IRON, RETICCTPCT in the last 72 hours. Sepsis Labs: No results for input(s): PROCALCITON, LATICACIDVEN in the last 168 hours.  Recent Results (from the past 240 hour(s))  SARS Coronavirus 2 by RT PCR (hospital order, performed in [facility] hospital lab) Nasopharyngeal Nasopharyngeal Swab     Status: None   Collection Time: 10/18/19  8:53 AM   Specimen: Nasopharyngeal Swab  Result Value Ref Range Status   SARS Coronavirus 2 NEGATIVE NEGATIVE Final    Comment: (NOTE) SARS-CoV-2 target nucleic acids are NOT DETECTED. The SARS-CoV-2 RNA is generally detectable in upper and lower respiratory specimens during the acute phase of infection. The lowest concentration of SARS-CoV-2 viral copies this assay can detect is 250 copies / mL. A negative result does not preclude SARS-CoV-2 infection and should not be used as the sole basis for treatment or other patient management decisions.  A negative result may occur with improper specimen collection / handling, submission of specimen other than nasopharyngeal swab, presence of viral mutation(s) within the areas targeted by this assay, and inadequate number of viral copies (<250 copies / mL). A negative result must be combined with clinical observations, patient history, and epidemiological information. Fact Sheet for Patients:   BoilerBrush.com.cy Fact Sheet for Healthcare  Providers: https://pope.com/ This test is not yet approved or cleared  by the [place] FDA and has been authorized for detection and/or diagnosis of SARS-CoV-2 by FDA under an Emergency Use Authorization (EUA).  This EUA will remain in effect (meaning this test can be used) for the duration of the COVID-19 declaration under Section 564(b)(1) of the Act, 21 U.S.C. section 360bbb-3(b)(1), unless the authorization is terminated or revoked sooner. Performed at [facility], [address] [place], [place] [address]   Urine Culture     Status: Abnormal (Preliminary result)   Collection Time: 10/18/19 11:15 AM   Specimen: Urine, Random  Result Value Ref Range Status   Specimen Description   Final    URINE, RANDOM Performed at [facility], [address] [place], [place] [address]    Special Requests   Final    NONE Performed at [facility], [address] [place], [place] [address]    Culture (A)  Final    >=100,000 COLONIES/mL SERRATIA MARCESCENS SUSCEPTIBILITIES TO FOLLOW Performed at [facility], [address], [place], [place] [address]    Report Status PENDING  Incomplete  Culture, blood (Routine X 2) w Reflex to ID Panel     Status: None (Preliminary result)   Collection Time: 10/18/19 11:16 AM   Specimen: BLOOD  Result Value Ref Range Status   Specimen Description   Final    BLOOD RIGHT ANTECUBITAL Performed at [facility], [address] [place], [place] [address]    Special Requests   Final    BOTTLES DRAWN AEROBIC AND ANAEROBIC Blood Culture adequate volume Performed at [facility], [address], [place], [place] [address]    Culture   Final    NO GROWTH 2 DAYS Performed at [facility], [address], [place], [place] [address]    Report Status PENDING  Incomplete  Culture, blood (Routine X 2) w Reflex to ID Panel      Status: None (Preliminary result)   Collection Time: 10/18/19 11:16 AM   Specimen: BLOOD LEFT HAND  Result Value Ref Range Status   Specimen Description   Final    BLOOD LEFT HAND Performed at [facility], [address] [place], [place] [address]    Special Requests   Final    BOTTLES DRAWN AEROBIC AND ANAEROBIC Blood Culture adequate volume Performed at [facility], [address], [place], [place] [address]    Culture   Final    NO GROWTH 2 DAYS Performed at [facility],  [address], [place], [place] [address]    Report Status PENDING  Incomplete  Surgical PCR screen     Status: Abnormal   Collection Time: 10/20/19  4:57 AM   Specimen: Nasal Mucosa; Nasal Swab  Result Value Ref Range Status   MRSA, PCR NEGATIVE NEGATIVE Final   Staphylococcus aureus POSITIVE (A) NEGATIVE Final    Comment: (NOTE) The Xpert SA Assay (FDA approved for NASAL specimens in patients 69 years of age and older), is one component of a comprehensive surveillance program. It is not intended to diagnose infection nor to guide or monitor treatment. Performed at [facility], [address], [place], [place] [address]          Radiology Studies: DG C-Arm 1-60 Min-No Report  Result Date: 10/18/2019 Fluoroscopy was utilized by the requesting physician.  No radiographic interpretation.   DG FEMUR MIN 2 VIEWS LEFT  Result Date: 10/18/2019 CLINICAL DATA:  Left femoral fracture EXAM: LEFT FEMUR 2 VIEWS COMPARISON:  10/18/2019 FLUOROSCOPY TIME:  Radiation Exposure Index (as provided by the fluoroscopic device): 21.1 mGy If the device does not provide the exposure index: Fluoroscopy Time:  1 minutes 24 seconds Number of Acquired Images:  3 FINDINGS: Medullary rod is noted in the proximal femur with fixation screw traversing the femoral neck. Fracture fragments are in near anatomic alignment. IMPRESSION: ORIF of left femoral fracture.  Electronically Signed   By: Alcide Clever M.D.   On: 10/18/2019 22:55        Scheduled Meds: . aspirin EC  325 mg Oral Q breakfast  . cholecalciferol  2,000 Units Oral Daily  . diclofenac  1 patch Transdermal BID  . ferrous sulfate  325 mg Oral TID PC  . magnesium oxide  400 mg Oral Daily  . metoprolol succinate  12.5 mg Oral Daily  . pantoprazole  40 mg Oral Daily  . trazodone  300 mg Oral QHS  . zinc sulfate  220 mg Oral Daily   Continuous Infusions: . sodium chloride 75 mL/hr at 10/20/19 0359     LOS: 2 days    Time spent:40 min   Page Lancon, Roselind Messier, MD [facility] Pager [phone]  If 7PM-7AM, please contact night-coverage [email or web address] Password [facility] 10/20/2019, 11:38 AM

## 2019-10-20 NOTE — Progress Notes (Signed)
     Subjective: 2 Days Post-Op Procedure(s) (LRB): INTRAMEDULLARY (IM) NAIL FEMORAL (Left)   Patient reports pain as moderate, resting comfortably in bed. States that she has a hard time at night, but that might be because she sleeps during the day.  Discharge disposition TBD.      Objective:   VITALS:   Vitals:   10/20/19 0651 10/20/19 0949  BP: 115/68 (!) 110/41  Pulse: 81 71  Resp: (!) 22   Temp:    SpO2: 96%     Dorsiflexion/Plantar flexion intact Incision: dressing C/D/I No cellulitis present Compartment soft  LABS Recent Labs    10/18/19 0853 10/19/19 0605 10/20/19 0537  HGB 13.9 11.1* 10.2*  HCT 44.5 36.6 33.2*  WBC 19.7* 15.0* 11.8*  PLT 218 152 138*    Recent Labs    10/18/19 0853 10/19/19 0605 10/20/19 0537  NA 141 142 140  K 4.3 4.7 4.4  BUN 16 14 9   CREATININE 0.90 0.83 0.75  GLUCOSE 156* 117* 123*     Assessment/Plan: 2 Days Post-Op Procedure(s) (LRB): INTRAMEDULLARY (IM) NAIL FEMORAL (Left)   Up with therapy Discharge disposition TBD   Ortho recommendations:  ASA 81 mg bid for 4 weeks for anticoagulation, unless other medically indicated.  Norco for pain management (Rx written).  MiraLax and Colace for constipation  Iron 325 mg tid for 2-3 weeks   PWB 50% on the left leg.  Dressing to remain in place until follow in clinic in 2 weeks.  Dressing is waterproof and may shower with it in place.  Follow up in 2 weeks at [facility]. Follow up with OLIN,Loucille Takach D in 2 weeks.  Contact information:  [facility] [address], [address] [place] [address]          [phone] PA-C  [facility]  [facility] [address], [address], [place], [place] Phone: [phone] [email or web address] Facebook  [phone]

## 2019-10-20 NOTE — TOC Progression Note (Signed)
Transition of Care [facility]) - Progression Note    Patient Details  Name: Leslie Dean MRN: [id number] Date of Birth: 10-Sep-1945  Transition of [facility]) CM/SW Contact  Ida [place], [place] Phone Number: 10/20/2019, 1:48 PM  Clinical Narrative:   Received authorization by insurance for rehab, transportation.  Those numbers, respectively, are [id number], [id number]. Spoke with daughter to inform her of bed offers, gave her list of facilities, told her I needed an answer tomorrow AM.  She will check with family and find out about visitation, and let me know. TOC will continue to follow during the course of hospitalization.     Expected Discharge Plan: Skilled Nursing Facility Barriers to Discharge: No Barriers Identified  Expected Discharge Plan and Services Expected Discharge Plan: Skilled Nursing Facility   Discharge Planning Services: CM Consult Post Acute Care Choice: Durable Medical Equipment, Home Health Living arrangements for the past 2 months: Single Family Home                                       Social Determinants of Health (SDOH) Interventions    Readmission Risk Interventions No flowsheet data found.

## 2019-10-21 ENCOUNTER — Inpatient Hospital Stay (HOSPITAL_COMMUNITY): Payer: PPO

## 2019-10-21 DIAGNOSIS — S2232XB Fracture of one rib, left side, initial encounter for open fracture: Secondary | ICD-10-CM

## 2019-10-21 LAB — COMPREHENSIVE METABOLIC PANEL
ALT: 13 U/L (ref 0–44)
AST: 18 U/L (ref 15–41)
Albumin: 2.6 g/dL — ABNORMAL LOW (ref 3.5–5.0)
Alkaline Phosphatase: 64 U/L (ref 38–126)
Anion gap: 10 (ref 5–15)
BUN: 12 mg/dL (ref 8–23)
CO2: 24 mmol/L (ref 22–32)
Calcium: 9 mg/dL (ref 8.9–10.3)
Chloride: 106 mmol/L (ref 98–111)
Creatinine, Ser: 0.58 mg/dL (ref 0.44–1.00)
GFR calc Af Amer: >60 mL/min (ref >60)
GFR calc non Af Amer: >60 mL/min (ref >60)
Glucose, Bld: 119 mg/dL — ABNORMAL HIGH (ref 70–99)
Potassium: 4.3 mmol/L (ref 3.5–5.1)
Sodium: 140 mmol/L (ref 135–145)
Total Bilirubin: 1.1 mg/dL (ref 0.3–1.2)
Total Protein: 5.6 g/dL — ABNORMAL LOW (ref 6.5–8.1)

## 2019-10-21 LAB — CBC WITH DIFFERENTIAL/PLATELET
Abs Immature Granulocytes: 0.07 10*3/uL (ref 0.00–0.07)
Basophils Absolute: 0 10*3/uL (ref 0.0–0.1)
Basophils Relative: 0 %
Eosinophils Absolute: 0 10*3/uL (ref 0.0–0.5)
Eosinophils Relative: 0 %
HCT: 32.7 % — ABNORMAL LOW (ref 36.0–46.0)
Hemoglobin: 10.1 g/dL — ABNORMAL LOW (ref 12.0–15.0)
Immature Granulocytes: 1 %
Lymphocytes Relative: 12 %
Lymphs Abs: 1.5 10*3/uL (ref 0.7–4.0)
MCH: 28.9 pg (ref 26.0–34.0)
MCHC: 30.9 g/dL (ref 30.0–36.0)
MCV: 93.7 fL (ref 80.0–100.0)
Monocytes Absolute: 1.3 10*3/uL — ABNORMAL HIGH (ref 0.1–1.0)
Monocytes Relative: 11 %
Neutro Abs: 9.6 10*3/uL — ABNORMAL HIGH (ref 1.7–7.7)
Neutrophils Relative %: 76 %
Platelets: 142 10*3/uL — ABNORMAL LOW (ref 150–400)
RBC: 3.49 MIL/uL — ABNORMAL LOW (ref 3.87–5.11)
RDW: 14.7 % (ref 11.5–15.5)
WBC: 12.5 10*3/uL — ABNORMAL HIGH (ref 4.0–10.5)
nRBC: 0 % (ref 0.0–0.2)

## 2019-10-21 LAB — MAGNESIUM: Magnesium: 2.2 mg/dL (ref 1.7–2.4)

## 2019-10-21 LAB — PHOSPHORUS: Phosphorus: 2.4 mg/dL — ABNORMAL LOW (ref 2.5–4.6)

## 2019-10-21 MED ORDER — TRAMADOL HCL 50 MG PO TABS
50.0000 mg | ORAL_TABLET | Freq: Four times a day (QID) | ORAL | Status: DC | PRN
  Administered 2019-10-25 – 2019-10-30 (×10): 50 mg via ORAL
  Filled 2019-10-21 (×10): qty 1

## 2019-10-21 NOTE — Progress Notes (Signed)
PROGRESS NOTE    KADESIA ROBEL  WUJ:[id number] DOB: Oct 31, 1945 DOA: 10/18/2019 PCP: Patient, No Pcp Per     Brief Narrative:  74 year old WF (retired Therapist, sports from [employer]) PMHx Marfan syndrome,Chronic back pain with some scoliosis and on multiple meds previously not on any now,HTN Prior admission large hepatic cyst 12/06/2012-(was benign)   Accidental fall-immediate left hip pain 10/10-Rx NAD 200 mcg fentanyl 1 mg Dilaudid 4 mg morphine = hypoxia in ED  EKG on admit RBBB LPF B Labs pretty normal other than white count 19 on admission   Subjective: 5/13 A/O x4, negative abdominal pain, negative S OB.  Positive LEFT hip pain.  Refusing to cooperate with PT/OT.  Family upset that patient drowsy.  Assessment & Plan:   Principal Problem:   Closed left hip fracture (HCC) Active Problems:   HTN (hypertension)   Marfans syndrome   Acute respiratory failure (HCC)   Acute lower UTI   LEFT proximal femur fracture -S/p 5/10 open reduction, internal fixation.  See procedure below  -CIR has evaluated patient and declined admission. -5/13 patient counseled at length that she must participate with PT/OT if she is to prevent sequela of not using her new joint; i.e. scar tissue buildup causing patient not to be able to use left hip possibly leading to her being wheelchair-bound.  Daughter present  Pain management -Patient has been resistant to working with PT/OT -5/13 Norco 7.5-325 mg (hold) -5/13 Robaxin 500 mg (hold) -5/13 morphine discontinued -5/13 Percocet 7.5--325 mg discontinue -5/13 Tylenol PRN -5/13 tramadol 50 mg PRN  Chest/abdominal pain -Most likely secondary to malpositioning of patient; previous fracture clavicle LEFT shoulder -Repeat EKG showed PVCs.  No significant change from prior EKG in 2014  Respiratory failure with hypoxia -Most likely iatrogenic secondary to extensive amount of narcotics. -See pain management  Hypotension on admission -Normal saline  75ml/hr -Resolved  Marfan syndrome, scoliosis -See pain management   LEFT fourth fractured rib -Most likely secondary to patient's fall -See pain management -Incentive spirometry     DVT prophylaxis: ASA 81 mg per orthopedic surgery Code Status: Full Family Communication: 5/13 daughter at bedside discussed plan of care answered all questions Disposition Plan: 5/12 consult LCSW patient will require SNF placement as CIR has declined admission. Status is: Inpatient  Dispo: The patient is from: Home              Anticipated d/c is to: CIR              Anticipated d/c date is:??              Patient currently unstable      Consultants:  Orthopedic surgery Dr. Paralee Cancel :  Procedures/Significant Events:  5/10 Open reduction internal fixation of left proximal femur fracture utilizing a [employer] Aff[employer]us long nail, 11 mm x 380 mm with a 115 mm lag screw and a distal interlock. 5/13 CXR;Findings suggestive of LEFT fourth rib fracture with some associated pleural thickening. No pneumothorax   I have personally reviewed and interpreted all radiology studies and my findings are as above.  VENTILATOR SETTINGS:    Cultures   Antimicrobials:    Devices    LINES / TUBES:      Continuous Infusions: . sodium chloride 75 mL/hr at 10/20/19 0359     Objective: Vitals:   10/20/19 0949 10/20/19 2052 10/21/19 0531 10/21/19 0937  BP: (!) 110/41 (!) 139/56 115/76 (!) 143/65  Pulse: 71 92 83 84  Resp:  20  16 18  Temp:  98.6 F (37 C) 98.2 F (36.8 C) 98.4 F (36.9 C)  TempSrc:  Oral Oral Oral  SpO2:  95% 97% 95%  Weight:      Height:        Intake/Output Summary (Last 24 hours) at 10/21/2019 1010 Last data filed at 10/21/2019 0532 Gross per 24 hour  Intake --  Output 650 ml  Net -650 ml   Filed Weights   10/18/19 1700  Weight: 88.9 kg    Examination:  General: Sleepy but arousable A/O x4, no acute respiratory distress Eyes: negative scleral  hemorrhage, negative anisocoria, negative icterus ENT: Negative Runny nose, negative gingival bleeding, Neck:  Negative scars, masses, torticollis, lymphadenopathy, JVD Lungs: Clear to auscultation bilaterally without wheezes or crackles, pain to palpation left lateral aspect chest wall Cardiovascular: Regular rate and rhythm without murmur gallop or rub normal S1 and S2 Abdomen: OBESE, negative abdominal pain, nondistended, positive soft, bowel sounds, no rebound, no ascites, no appreciable mass Extremities: No significant cyanosis, clubbing.  Left thigh/hip swollen, negative warmth to touch (appropriately so post surgery) Skin: Negative rashes, lesions, ulcers Psychiatric:  Negative depression, negative anxiety, negative fatigue, negative mania  Central nervous system:  Cranial nerves II through XII intact, tongue/uvula midline, all extremities muscle strength 5/5, sensation intact throughout,  negative dysarthria, negative expressive aphasia, negative receptive aphasia.  .     Data Reviewed: Care during the described time interval was provided by me .  I have reviewed this patient's available data, including medical history, events of note, physical examination, and all test results as part of my evaluation.  CBC: Recent Labs  Lab 10/18/19 0853 10/19/19 0605 10/20/19 0537 10/21/19 0554  WBC 19.7* 15.0* 11.8* 12.5*  NEUTROABS 17.4*  --   --  9.6*  HGB 13.9 11.1* 10.2* 10.1*  HCT 44.5 36.6 33.2* 32.7*  MCV 94.3 97.3 96.2 93.7  PLT 218 152 138* 142*   Basic Metabolic Panel: Recent Labs  Lab 10/18/19 0853 10/19/19 0605 10/20/19 0537 10/21/19 0554  NA 141 142 140 140  K 4.3 4.7 4.4 4.3  CL 105 106 107 106  CO2 26 28 26 24   GLUCOSE 156* 117* 123* 119*  BUN 16 14 9 12   CREATININE 0.90 0.83 0.75 0.58  CALCIUM 9.3 8.6* 9.0 9.0  MG  --   --   --  2.2  PHOS  --   --   --  2.4*   GFR: Estimated Creatinine Clearance: 71.7 mL/min (by C-G formula based on SCr of 0.58  mg/dL). Liver Function Tests: Recent Labs  Lab 10/18/19 1844 10/21/19 0554  AST  --  18  ALT  --  13  ALKPHOS  --  64  BILITOT  --  1.1  PROT  --  5.6*  ALBUMIN 3.4* 2.6*   No results for input(s): LIPASE, AMYLASE in the last 168 hours. No results for input(s): AMMONIA in the last 168 hours. Coagulation Profile: No results for input(s): INR, PROTIME in the last 168 hours. Cardiac Enzymes: Recent Labs  Lab 10/18/19 1115  CKTOTAL 230   BNP (last 3 results) No results for input(s): PROBNP in the last 8760 hours. HbA1C: No results for input(s): HGBA1C in the last 72 hours. CBG: No results for input(s): GLUCAP in the last 168 hours. Lipid Profile: No results for input(s): CHOL, HDL, LDLCALC, TRIG, CHOLHDL, LDLDIRECT in the last 72 hours. Thyroid Function Tests: No results for input(s): TSH, T4TOTAL, FREET4, T3FREE, THYROIDAB in the  last 72 hours. Anemia Panel: No results for input(s): VITAMINB12, FOLATE, FERRITIN, TIBC, IRON, RETICCTPCT in the last 72 hours. Sepsis Labs: No results for input(s): PROCALCITON, LATICACIDVEN in the last 168 hours.  Recent Results (from the past 240 hour(s))  SARS Coronavirus 2 by RT PCR (hospital order, performed in [facility] hospital lab) Nasopharyngeal Nasopharyngeal Swab     Status: None   Collection Time: 10/18/19  8:53 AM   Specimen: Nasopharyngeal Swab  Result Value Ref Range Status   SARS Coronavirus 2 NEGATIVE NEGATIVE Final    Comment: (NOTE) SARS-CoV-2 target nucleic acids are NOT DETECTED. The SARS-CoV-2 RNA is generally detectable in upper and lower respiratory specimens during the acute phase of infection. The lowest concentration of SARS-CoV-2 viral copies this assay can detect is 250 copies / mL. A negative result does not preclude SARS-CoV-2 infection and should not be used as the sole basis for treatment or other patient management decisions.  A negative result may occur with improper specimen collection / handling,  submission of specimen other than nasopharyngeal swab, presence of viral mutation(s) within the areas targeted by this assay, and inadequate number of viral copies (<250 copies / mL). A negative result must be combined with clinical observations, patient history, and epidemiological information. Fact Sheet for Patients:   BoilerBrush.com.cy Fact Sheet for Healthcare Providers: https://pope.com/ This test is not yet approved or cleared  by the [place] FDA and has been authorized for detection and/or diagnosis of SARS-CoV-2 by FDA under an Emergency Use Authorization (EUA).  This EUA will remain in effect (meaning this test can be used) for the duration of the COVID-19 declaration under Section 564(b)(1) of the Act, 21 U.S.C. section 360bbb-3(b)(1), unless the authorization is terminated or revoked sooner. Performed at [facility], [address], [place], [place] [address]   Urine Culture     Status: Abnormal (Preliminary result)   Collection Time: 10/18/19 11:15 AM   Specimen: Urine, Random  Result Value Ref Range Status   Specimen Description   Final    URINE, RANDOM Performed at [facility], [address], [place], [place] [address]    Special Requests   Final    NONE Performed at [facility], [address] [place], [place] [address]    Culture (A)  Final    >=100,000 COLONIES/mL SERRATIA MARCESCENS SUSCEPTIBILITIES TO FOLLOW Performed at [facility], [address], [place], [place] [address]    Report Status PENDING  Incomplete  Culture, blood (Routine X 2) w Reflex to ID Panel     Status: None (Preliminary result)   Collection Time: 10/18/19 11:16 AM   Specimen: BLOOD  Result Value Ref Range Status   Specimen Description   Final    BLOOD RIGHT ANTECUBITAL Performed at [facility], [address] [place], [place] [address]     Special Requests   Final    BOTTLES DRAWN AEROBIC AND ANAEROBIC Blood Culture adequate volume Performed at [facility], [address], [place], [place] [address]    Culture   Final    NO GROWTH 3 DAYS Performed at [facility], [address] [place], [place] [address]    Report Status PENDING  Incomplete  Culture, blood (Routine X 2) w Reflex to ID Panel     Status: None (Preliminary result)   Collection Time: 10/18/19 11:16 AM   Specimen: BLOOD LEFT HAND  Result Value Ref Range Status  Specimen Description   Final    BLOOD LEFT HAND Performed at [facility], [address] [place], [place] [address]    Special Requests   Final    BOTTLES DRAWN AEROBIC AND ANAEROBIC Blood Culture adequate volume Performed at [facility], [address] [place], [place] [address]    Culture   Final    NO GROWTH 3 DAYS Performed at [facility], [address] [place], [place] [address]    Report Status PENDING  Incomplete  Surgical PCR screen     Status: Abnormal   Collection Time: 10/20/19  4:57 AM   Specimen: Nasal Mucosa; Nasal Swab  Result Value Ref Range Status   MRSA, PCR NEGATIVE NEGATIVE Final   Staphylococcus aureus POSITIVE (A) NEGATIVE Final    Comment: (NOTE) The Xpert SA Assay (FDA approved for NASAL specimens in patients 6 years of age and older), is one component of a comprehensive surveillance program. It is not intended to diagnose infection nor to guide or monitor treatment. Performed at [facility], [address] [place], [place] [address]          Radiology Studies: No results found.      Scheduled Meds: . aspirin EC  325 mg Oral Q breakfast  . cholecalciferol  2,000 Units Oral Daily  . diclofenac  1 patch Transdermal BID  . ferrous sulfate  325 mg Oral TID PC  . magnesium oxide  400 mg Oral Daily  . metoprolol succinate  12.5 mg Oral Daily  .  pantoprazole  40 mg Oral Daily  . trazodone  300 mg Oral QHS  . zinc sulfate  220 mg Oral Daily   Continuous Infusions: . sodium chloride 75 mL/hr at 10/20/19 0359     LOS: 3 days    Time spent:40 min   Lella Mullany, Roselind Messier, MD [facility] Pager [phone]  If 7PM-7AM, please contact night-coverage [email or web address] Password [email or web address] 10/21/2019, 10:10 AM

## 2019-10-21 NOTE — Care Management Important Message (Signed)
Important Message  Patient Details IM Letter given to Daryel Gerald SW Case Manager to present to the Patient Name: Leslie Dean MRN: [id number] Date of Birth: [birth year]   Medicare Important Message Given:  Yes     Caren Macadam 10/21/2019, 10:10 AM

## 2019-10-21 NOTE — Plan of Care (Signed)
  Problem: Education: Goal: Knowledge of General Education information will improve Description: Including pain rating scale, medication(s)/side effects and non-pharmacologic comfort measures Outcome: Progressing   Problem: Health Behavior/Discharge Planning: Goal: Ability to manage health-related needs will improve Outcome: Progressing   Problem: Activity: Goal: Risk for activity intolerance will decrease Outcome: Not Progressing  Problem: Nutrition: Goal: Adequate nutrition will be maintained Outcome: Progressing   Problem: Coping: Goal: Level of anxiety will decrease Outcome: Progressing   Problem: Nutrition: Goal: Adequate nutrition will be maintained Outcome: Progressing

## 2019-10-21 NOTE — Progress Notes (Signed)
Patient slept through night. This am, complained of spasms and pain left upper hip lower back. This am when offered medication, patient declined. Patient was educated about mobilization and pain management. Patient encouraged to change her position as well.

## 2019-10-21 NOTE — TOC Progression Note (Signed)
Transition of Care [facility]) - Progression Note    Patient Details  Name: Leslie Dean MRN: [id number] Date of Birth: [birth year]  Transition of Care [facility] CM/SW Contact  Ida Rogue, [place] Phone Number: 10/21/2019, 2:38 PM  Clinical Narrative:   Went to patient's room to follow up with daughter about choice of facility.  Patient's mental status had changed; family was in crises.  Not able to give an answer.  Will check back tomorrow. TOC will continue to follow during the course of hospitalization.     Expected Discharge Plan: Skilled Nursing Facility Barriers to Discharge: SNF Pending bed offer  Expected Discharge Plan and Services Expected Discharge Plan: Skilled Nursing Facility   Discharge Planning Services: CM Consult Post Acute Care Choice: Durable Medical Equipment, Home Health Living arrangements for the past 2 months: Single Family Home                                       Social Determinants of Health (SDOH) Interventions    Readmission Risk Interventions No flowsheet data found.

## 2019-10-21 NOTE — Progress Notes (Addendum)
Physical Therapy Treatment Patient Details Name: Leslie Dean MRN: [id number] DOB: May 20, 1946 Today's Date: 10/21/2019    History of Present Illness Patient is a 74 year old female with chronic back pain secondary to scoliosis, spinal stenosis, hypertension, Marfan syndrome, urinary incontinence presented to ED 10/18/19 with fall at home resulting in left hip pain. Xray-Comminuted intertrochanteric femur fracture proximally .S/P IM nailing 10/19/19    PT Comments    Patient continues to be difficult to mobilize, reports severe pain of the the left hip and C/O chest pain. Dr. Joseph Art in , aware of complaints and  encourged patient to mobilize. Daughter is present. Patient noted with Dyspnea 2-3 resting, on 2 L [place] SPO2 -SPO2 95%. On RA  91-93%. Exceptional amount of time taken to roll and change bed pad. Did not sit up as chest xray ordered and going down soon.  Patient did perform a few AA ROM  To Left leg with sheet around the foot to pull up in a small amount of flexion.  At this time, patient most likely will require SNF for rehab or extensive home health equipment and personnel.  Follow Up Recommendations  SNF;Supervision/Assistance - 24 hour     Equipment Recommendations  Wheelchair (measurements PT);Wheelchair cushion (measurements PT)    Recommendations for Other Services       Precautions / Restrictions Precautions Precautions: Fall  , H/O significant back issues from scoliosis. Precaution Comments: patient grabs at  persons helping, resistive Restrictions LLE Weight Bearing: Partial weight bearing LLE Partial Weight Bearing Percentage or Pounds: 50    Mobility  Bed Mobility Overal bed mobility: Needs Assistance Bed Mobility: Rolling Rolling: +2 for safety/equipment;+2 for physical assistance;Total assist         General bed mobility comments: patient stating" don't move me, let me do it, " then does not attempt to move, CNA in  to assist with rolling and bed linen  change. patient resitive.  Transfers                 General transfer comment: unable  Ambulation/Gait                 Stairs             Wheelchair Mobility    Modified Rankin (Stroke Patients Only)       Balance       Sitting balance - Comments: NT                                    Cognition Arousal/Alertness: Lethargic Behavior During Therapy: Anxious;Restless                                   General Comments: continues to be very drowsy and keeps eyes  closed mostly,      Exercises General Exercises - Lower Extremity Short Arc Quad: AAROM;10 reps;Supine Heel Slides: AAROM;5 reps;Supine    General Comments        Pertinent Vitals/Pain Faces Pain Scale: Hurts worst Pain Location: left back and left thigh/hip, chest pain Pain Descriptors / Indicators: Aching;Discomfort;Moaning;Grimacing Pain Intervention(s): Limited activity within patient's tolerance;Monitored during session;Repositioned    Home Living                      Prior Function  PT Goals (current goals can now be found in the care plan section) Progress towards PT goals: Not progressing toward goals - comment(patient limits mobility with c/o pain.)    Frequency    Min 3X/week      PT Plan Frequency needs to be updated;Discharge plan needs to be updated    Co-evaluation              AM-PAC PT "6 Clicks" Mobility   Outcome Measure  Help needed turning from your back to your side while in a flat bed without using bedrails?: Total Help needed moving from lying on your back to sitting on the side of a flat bed without using bedrails?: Total Help needed moving to and from a bed to a chair (including a wheelchair)?: Total Help needed standing up from a chair using your arms (e.g., wheelchair or bedside chair)?: Total Help needed to walk in hospital room?: Total Help needed climbing 3-5 steps with a railing? :  Total 6 Click Score: 6    End of Session Equipment Utilized During Treatment: Oxygen Activity Tolerance: Patient limited by pain;Patient limited by lethargy Patient left: in bed;with call bell/phone within reach;with bed alarm set;with family/visitor present;with nursing/sitter in room Nurse Communication: Mobility status;Need for lift equipment PT Visit Diagnosis: Unsteadiness on feet (R26.81);Difficulty in walking, not elsewhere classified (R26.2)     Time: 6203-5597 PT Time Calculation (min) (ACUTE ONLY): 70 min  Charges:  $Therapeutic Activity: 38-52 $Self Care/Home Management: [place] Pager [phone] Office [phone]    Claretha Cooper 10/21/2019, 3:33 PM

## 2019-10-21 NOTE — Progress Notes (Signed)
MD, PT, RN, & pt's daughter present at bedside. Pt educated on importance of participating with physical therapy. Pt acknowledged.

## 2019-10-21 NOTE — Progress Notes (Signed)
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  Stated worked with PT ambulated 3 steps today from bed to chair    Assessment & Plan:   Principal Problem:   Closed left hip fracture (HCC) Active Problems:   HTN (hypertension)   Marfans syndrome   Acute respiratory failure (HCC)   Acute lower UTI   LEFT proximal femur fracture -S/p 5/10 open reduction, internal fixation.  See procedure below  -CIR has evaluated patient and declined admission. -5/13 patient counseled at length that she must participate with PT/OT if she is to prevent sequela of not using her new joint; i.e. scar tissue buildup causing patient not to be able to use left hip possibly leading to her being wheelchair-bound.  Daughter present  Pain management -Patient has been resistant to working with PT/OT -5/13 Norco 7.5-325 mg (hold) -5/13 Robaxin 500 mg (hold) -5/13 morphine discontinued -5/13 Percocet 7.5--325 mg discontinue -5/13 Tylenol PRN -5/13 tramadol 50 mg PRN  Chest/abdominal pain -Most likely secondary to malpositioning of patient; previous fracture clavicle LEFT shoulder -Repeat EKG showed PVCs.  No significant change from prior EKG in 2014  Respiratory failure with hypoxia -Most likely iatrogenic secondary to extensive amount of narcotics. -See pain management  Hypotension on  admission -Normal saline 33ml/hr -Resolved  Marfan syndrome, scoliosis -See pain management   LEFT fourth fractured rib -Most likely secondary to patient's fall -See pain management -Incentive spirometry  Goals of care -5/14 Reconsult inpatient Rehab patient now more alert and interactive.  Patient should be a good candidate for CIR.    DVT prophylaxis: ASA 81 mg per orthopedic surgery Code Status: Full Family Communication: 5/14 daughter at bedside discussed plan of care answered all questions Disposition Plan: 5/12 consult LCSW patient will require SNF placement as CIR has declined admission. Status is: Inpatient  Dispo: The patient is from: Home              Anticipated d/c is to: CIR              Anticipated d/c date is:??              Patient currently unstable      Consultants:  Orthopedic surgery Dr. Durene Romans :  Procedures/Significant Events:  5/10 Open reduction internal fixation of left proximal femur fracture utilizing a [employer] long nail, 11 mm x 380 mm with a 115 mm lag screw and a distal interlock. 5/13 CXR;-findings suggestive of left fourth rib fracture with some associated pleural thickening. No pneumothorax    I have personally reviewed and interpreted all radiology studies and my findings are as above.  VENTILATOR SETTINGS:    Cultures   Antimicrobials:    Devices    LINES / TUBES:      Continuous Infusions: . sodium chloride 75 mL/hr at 10/20/19 0359     Objective: Vitals:  10/20/19 0651 10/20/19 0949 10/20/19 2052 10/21/19 0531  BP: 115/68 (!) 110/41 (!) 139/56 115/76  Pulse: 81 71 92 83  Resp: (!) 22  20 16   Temp:   98.6 F (37 C) 98.2 F (36.8 C)  TempSrc:   Oral Oral  SpO2: 96%  95% 97%  Weight:      Height:        Intake/Output Summary (Last 24 hours) at 10/21/2019 10/23/2019 Last data filed at 10/21/2019 0532 Gross per 24 hour  Intake --  Output 650 ml  Net -650 ml   Filed Weights    10/18/19 1700  Weight: 88.9 kg    Examination:  General: Sleepy but arousable A/O x4, no acute respiratory distress Eyes: negative scleral hemorrhage, negative anisocoria, negative icterus ENT: Negative Runny nose, negative gingival bleeding, Neck:  Negative scars, masses, torticollis, lymphadenopathy, JVD Lungs: Clear to auscultation bilaterally without wheezes or crackles Cardiovascular: Regular rate and rhythm without murmur gallop or rub normal S1 and S2 Abdomen: OBESE, negative abdominal pain, nondistended, positive soft, bowel sounds, no rebound, no ascites, no appreciable mass Extremities: No significant cyanosis, clubbing.  Left thigh/hip swollen, negative warmth to touch (appropriately so post surgery) Skin: Negative rashes, lesions, ulcers Psychiatric:  Negative depression, negative anxiety, negative fatigue, negative mania  Central nervous system:  Cranial nerves II through XII intact, tongue/uvula midline, all extremities muscle strength 5/5, sensation intact throughout,  negative dysarthria, negative expressive aphasia, negative receptive aphasia.  .     Data Reviewed: Care during the described time interval was provided by me .  I have reviewed this patient's available data, including medical history, events of note, physical examination, and all test results as part of my evaluation.  CBC: Recent Labs  Lab 10/18/19 0853 10/19/19 0605 10/20/19 0537 10/21/19 0554  WBC 19.7* 15.0* 11.8* 12.5*  NEUTROABS 17.4*  --   --  9.6*  HGB 13.9 11.1* 10.2* 10.1*  HCT 44.5 36.6 33.2* 32.7*  MCV 94.3 97.3 96.2 93.7  PLT 218 152 138* 142*   Basic Metabolic Panel: Recent Labs  Lab 10/18/19 0853 10/19/19 0605 10/20/19 0537 10/21/19 0554  NA 141 142 140 140  K 4.3 4.7 4.4 4.3  CL 105 106 107 106  CO2 26 28 26 24   GLUCOSE 156* 117* 123* 119*  BUN 16 14 9 12   CREATININE 0.90 0.83 0.75 0.58  CALCIUM 9.3 8.6* 9.0 9.0  MG  --   --   --  2.2  PHOS  --   --   --  2.4*    GFR: Estimated Creatinine Clearance: 71.7 mL/min (by C-G formula based on SCr of 0.58 mg/dL). Liver Function Tests: Recent Labs  Lab 10/18/19 1844 10/21/19 0554  AST  --  18  ALT  --  13  ALKPHOS  --  64  BILITOT  --  1.1  PROT  --  5.6*  ALBUMIN 3.4* 2.6*   No results for input(s): LIPASE, AMYLASE in the last 168 hours. No results for input(s): AMMONIA in the last 168 hours. Coagulation Profile: No results for input(s): INR, PROTIME in the last 168 hours. Cardiac Enzymes: Recent Labs  Lab 10/18/19 1115  CKTOTAL 230   BNP (last 3 results) No results for input(s): PROBNP in the last 8760 hours. HbA1C: No results for input(s): HGBA1C in the last 72 hours. CBG: No results for input(s): GLUCAP in the last 168 hours. Lipid Profile: No results for input(s): CHOL, HDL, LDLCALC, TRIG, CHOLHDL, LDLDIRECT in the last  72 hours. Thyroid Function Tests: No results for input(s): TSH, T4TOTAL, FREET4, T3FREE, THYROIDAB in the last 72 hours. Anemia Panel: No results for input(s): VITAMINB12, FOLATE, FERRITIN, TIBC, IRON, RETICCTPCT in the last 72 hours. Sepsis Labs: No results for input(s): PROCALCITON, LATICACIDVEN in the last 168 hours.  Recent Results (from the past 240 hour(s))  SARS Coronavirus 2 by RT PCR (hospital order, performed in [facility] hospital lab) Nasopharyngeal Nasopharyngeal Swab     Status: None   Collection Time: 10/18/19  8:53 AM   Specimen: Nasopharyngeal Swab  Result Value Ref Range Status   SARS Coronavirus 2 NEGATIVE NEGATIVE Final    Comment: (NOTE) SARS-CoV-2 target nucleic acids are NOT DETECTED. The SARS-CoV-2 RNA is generally detectable in upper and lower respiratory specimens during the acute phase of infection. The lowest concentration of SARS-CoV-2 viral copies this assay can detect is 250 copies / mL. A negative result does not preclude SARS-CoV-2 infection and should not be used as the sole basis for treatment or other patient management  decisions.  A negative result may occur with improper specimen collection / handling, submission of specimen other than nasopharyngeal swab, presence of viral mutation(s) within the areas targeted by this assay, and inadequate number of viral copies (<250 copies / mL). A negative result must be combined with clinical observations, patient history, and epidemiological information. Fact Sheet for Patients:   StrictlyIdeas.no Fact Sheet for Healthcare Providers: BankingDealers.co.za This test is not yet approved or cleared  by the [place] FDA and has been authorized for detection and/or diagnosis of SARS-CoV-2 by FDA under an Emergency Use Authorization (EUA).  This EUA will remain in effect (meaning this test can be used) for the duration of the COVID-19 declaration under Section 564(b)(1) of the Act, 21 U.S.C. section 360bbb-3(b)(1), unless the authorization is terminated or revoked sooner. Performed at [facility], [place] [address] [place], [place] [address]   Urine Culture     Status: Abnormal (Preliminary result)   Collection Time: 10/18/19 11:15 AM   Specimen: Urine, Random  Result Value Ref Range Status   Specimen Description   Final    URINE, RANDOM Performed at [place] [address] [place], [place] [address]    Special Requests   Final    NONE Performed at [facility], [place] [address] [place], [place] [address]    Culture (A)  Final    >=100,000 COLONIES/mL SERRATIA MARCESCENS SUSCEPTIBILITIES TO FOLLOW Performed at [facility], [place] [address] [place], [place] [address]    Report Status PENDING  Incomplete  Culture, blood (Routine X 2) w Reflex to ID Panel     Status: None (Preliminary result)   Collection Time: 10/18/19 11:16 AM   Specimen: BLOOD  Result Value Ref Range Status   Specimen Description   Final    BLOOD RIGHT  ANTECUBITAL Performed at [place] [address], [place], [place] [address]    Special Requests   Final    BOTTLES DRAWN AEROBIC AND ANAEROBIC Blood Culture adequate volume Performed at [place] [address], [place], [place] [address]    Culture   Final    NO GROWTH 2 DAYS Performed at [place] [address] [place], [place] [address]    Report Status PENDING  Incomplete  Culture, blood (Routine X 2) w Reflex to ID Panel     Status: None (Preliminary result)   Collection  Time: 10/18/19 11:16 AM   Specimen: BLOOD LEFT HAND  Result Value Ref Range Status   Specimen Description   Final    BLOOD LEFT HAND Performed at [facility], [address] [place], [place] [address]    Special Requests   Final    BOTTLES DRAWN AEROBIC AND ANAEROBIC Blood Culture adequate volume Performed at [facility] [address], [place], [place] [address]    Culture   Final    NO GROWTH 2 DAYS Performed at [facility], [address], [place], [place] [address]    Report Status PENDING  Incomplete  Surgical PCR screen     Status: Abnormal   Collection Time: 10/20/19  4:57 AM   Specimen: Nasal Mucosa; Nasal Swab  Result Value Ref Range Status   MRSA, PCR NEGATIVE NEGATIVE Final   Staphylococcus aureus POSITIVE (A) NEGATIVE Final    Comment: (NOTE) The Xpert SA Assay (FDA approved for NASAL specimens in patients 33 years of age and older), is one component of a comprehensive surveillance program. It is not intended to diagnose infection nor to guide or monitor treatment. Performed at [facility], [address] [place], [place] [address]          Radiology Studies: No results found.      Scheduled Meds: . aspirin EC  325 mg Oral Q breakfast  . cholecalciferol  2,000 Units Oral Daily  . diclofenac  1 patch Transdermal BID  . ferrous sulfate  325 mg Oral  TID PC  . magnesium oxide  400 mg Oral Daily  . metoprolol succinate  12.5 mg Oral Daily  . pantoprazole  40 mg Oral Daily  . trazodone  300 mg Oral QHS  . zinc sulfate  220 mg Oral Daily   Continuous Infusions: . sodium chloride 75 mL/hr at 10/20/19 0359     LOS: 3 days    Time spent:40 min   Alayla Dethlefs, Roselind Messier, MD [facility] Pager [phone]  If 7PM-7AM, please contact night-coverage [email or web address] Password [facility] 10/21/2019, 8:32 AM

## 2019-10-22 LAB — COMPREHENSIVE METABOLIC PANEL
ALT: 15 U/L (ref 0–44)
AST: 15 U/L (ref 15–41)
Albumin: 2.6 g/dL — ABNORMAL LOW (ref 3.5–5.0)
Alkaline Phosphatase: 65 U/L (ref 38–126)
Anion gap: 6 (ref 5–15)
BUN: 15 mg/dL (ref 8–23)
CO2: 27 mmol/L (ref 22–32)
Calcium: 9 mg/dL (ref 8.9–10.3)
Chloride: 107 mmol/L (ref 98–111)
Creatinine, Ser: 0.6 mg/dL (ref 0.44–1.00)
GFR calc Af Amer: >60 mL/min (ref >60)
GFR calc non Af Amer: >60 mL/min (ref >60)
Glucose, Bld: 119 mg/dL — ABNORMAL HIGH (ref 70–99)
Potassium: 4.3 mmol/L (ref 3.5–5.1)
Sodium: 140 mmol/L (ref 135–145)
Total Bilirubin: 1.2 mg/dL (ref 0.3–1.2)
Total Protein: 5.7 g/dL — ABNORMAL LOW (ref 6.5–8.1)

## 2019-10-22 LAB — CBC WITH DIFFERENTIAL/PLATELET
Abs Immature Granulocytes: 0.07 10*3/uL (ref 0.00–0.07)
Basophils Absolute: 0 10*3/uL (ref 0.0–0.1)
Basophils Relative: 0 %
Eosinophils Absolute: 0 10*3/uL (ref 0.0–0.5)
Eosinophils Relative: 0 %
HCT: 33.3 % — ABNORMAL LOW (ref 36.0–46.0)
Hemoglobin: 10.4 g/dL — ABNORMAL LOW (ref 12.0–15.0)
Immature Granulocytes: 1 %
Lymphocytes Relative: 13 %
Lymphs Abs: 1.8 10*3/uL (ref 0.7–4.0)
MCH: 29.1 pg (ref 26.0–34.0)
MCHC: 31.2 g/dL (ref 30.0–36.0)
MCV: 93 fL (ref 80.0–100.0)
Monocytes Absolute: 1.5 10*3/uL — ABNORMAL HIGH (ref 0.1–1.0)
Monocytes Relative: 11 %
Neutro Abs: 9.8 10*3/uL — ABNORMAL HIGH (ref 1.7–7.7)
Neutrophils Relative %: 75 %
Platelets: 154 10*3/uL (ref 150–400)
RBC: 3.58 MIL/uL — ABNORMAL LOW (ref 3.87–5.11)
RDW: 14.6 % (ref 11.5–15.5)
WBC: 13.1 10*3/uL — ABNORMAL HIGH (ref 4.0–10.5)
nRBC: 0 % (ref 0.0–0.2)

## 2019-10-22 LAB — PHOSPHORUS: Phosphorus: 2.5 mg/dL (ref 2.5–4.6)

## 2019-10-22 LAB — MAGNESIUM: Magnesium: 2.1 mg/dL (ref 1.7–2.4)

## 2019-10-22 LAB — URINE CULTURE: Culture: >=100000 — AB

## 2019-10-22 NOTE — Progress Notes (Signed)
Physical Therapy Treatment Patient Details Name: Leslie Dean MRN: [id number] DOB: [birth year] Today's Date: 10/22/2019    History of Present Illness Patient is a 74 year old female with chronic back pain secondary to scoliosis, spinal stenosis, hypertension, Marfan syndrome, urinary incontinence presented to ED 10/18/19 with fall at home resulting in left hip pain. Xray-Comminuted intertrochanteric femur fracture proximally .S/P IM nailing 10/19/19    PT Comments    Pt continues to be very resistant to mobility. Pt's daughters in room during session and educated with pt throughout treatment session on step by step sequencing and task at hand to ease anxiousness and improve compliance with mobility. Pt attempts to slide LLE to EOB but only tolerates 2-3 before requiring  total assist to come to sitting on EOB, yelling out in pain and attempting to grab onto bedrails and cords. Once sitting EOB, pt reports "that wasn't as bad as I thought". Therapist demonstrates WB status, technique with rising to stand, and how to take steps to pt and daughters before performing in session. Pt requires max assist x2 to rise from bed, but does not yell out in pain with rising to stand. Pt able to take 3 sidesteps with constant verbal cues to push through BUE to step with RLE to maintain LLE WB status with good carryover. Pt resistant to return to sitting, maintains R grip on RW and poor safety awareness with transfer. Pt demonstrates lateral lean to R forearm in sitting with heavy avoidance of L hip WB in sitting. Pt unable to assist with return to supine, constantly cued with step by step instructions to ease anxiousness but continues to attempt to reach for bedrail and oxygen tubing with mobility. Pt scooted up in bed total assist with bed assist and positioned comfortably with call bell in lap. Pt's daughters present during treatment, all questions answered and verbalize thankfulness towards therapy and for how well  the pt did today. Pt asks how long until she can walk again and therapist educated her on how all pt's are different, some can walk POD1 and encouraged her to continue progressing with therapy as able. Pt verbalizes at EOS that it wasn't as bad as she thought when moving and encouraged with progress. Patient will benefit from continued physical therapy in hospital and recommendations below to increase strength, balance, endurance for safe ADLs and gait.   Follow Up Recommendations  SNF;Supervision/Assistance - 24 hour     Equipment Recommendations  Wheelchair (measurements PT);Wheelchair cushion (measurements PT)    Recommendations for Other Services       Precautions / Restrictions Precautions Precautions: Fall Precaution Comments: very resistant, L 4th rib fx Restrictions LLE Weight Bearing: Partial weight bearing LLE Partial Weight Bearing Percentage or Pounds: 50    Mobility  Bed Mobility Overal bed mobility: Needs Assistance Bed Mobility: Supine to Sit;Sit to Supine  Supine to sit: Max assist;+2 for physical assistance Sit to supine: Total assist;+2 for physical assistance  General bed mobility comments: LLE abd towards EOB 2-3 inches before requiring max assist x2 to bring BLE to EOB and trunk uprighting with max verbal cues to let go of bedrail, constant cues to decrease anxiety and encourage pt with mobility; total assist to return to supine for trunk and BLE and total assist with bed assist to scoot up in bed  Transfers Overall transfer level: Needs assistance Equipment used: Rolling walker (2 wheeled) Transfers: Sit to/from Stand Sit to Stand: Max assist;+2 physical assistance  General transfer comment: cues to push from  EOB, verbally educated pt on LLE WB status and provided demonstration, constant cues to decrease anxiety and encourage pt with mobility  Ambulation/Gait Ambulation/Gait assistance: Mod assist;+2 physical assistance  Assistive device: Rolling walker (2  wheeled)  General Gait Details: provided demonstration and verbal explanation to pt and pt's daugthers; 3 sidesteps at EOB, step by step instructions with repeated cues to push through BUE when stepping with RLE, heavy BUE assist onto RW with minimal foot clearance from floor to sidestep   Stairs             Wheelchair Mobility    Modified Rankin (Stroke Patients Only)       Balance Overall balance assessment: Needs assistance;History of Falls Sitting-balance support: Bilateral upper extremity supported;Feet supported Sitting balance-Leahy Scale: Poor Sitting balance - Comments: avoiding LLE, propped on R forearm with heavy assist to achieve midline Postural control: Right lateral lean Standing balance support: During functional activity;Bilateral upper extremity supported Standing balance-Leahy Scale: Poor Standing balance comment: with RW           Cognition Arousal/Alertness: Awake/alert Behavior During Therapy: Anxious Overall Cognitive Status: Within Functional Limits for tasks assessed        General Comments: alert and conversational, very resistive to mobility requiring max encouragement and education      Exercises      General Comments        Pertinent Vitals/Pain Pain Assessment: Faces Faces Pain Scale: Hurts worst Pain Location: chest/rib, L hip Pain Descriptors / Indicators: Aching;Discomfort;Moaning;Grimacing Pain Intervention(s): Limited activity within patient's tolerance;Monitored during session;Premedicated before session;Repositioned    Home Living                      Prior Function            PT Goals (current goals can now be found in the care plan section) Acute Rehab PT Goals Patient Stated Goal: to go home PT Goal Formulation: With patient Time For Goal Achievement: 11/02/19 Potential to Achieve Goals: Fair Progress towards PT goals: Progressing toward goals(but requires max education and encouragement)     Frequency    Min 3X/week      PT Plan Current plan remains appropriate    Co-evaluation              AM-PAC PT "6 Clicks" Mobility   Outcome Measure  Help needed turning from your back to your side while in a flat bed without using bedrails?: Total Help needed moving from lying on your back to sitting on the side of a flat bed without using bedrails?: Total Help needed moving to and from a bed to a chair (including a wheelchair)?: Total Help needed standing up from a chair using your arms (e.g., wheelchair or bedside chair)?: Total Help needed to walk in hospital room?: A Lot Help needed climbing 3-5 steps with a railing? : Total 6 Click Score: 7    End of Session Equipment Utilized During Treatment: Oxygen Activity Tolerance: Patient limited by pain;Other (comment)(pt resistive to mobility) Patient left: in bed;with call bell/phone within reach;with bed alarm set;with family/visitor present Nurse Communication: Mobility status PT Visit Diagnosis: Unsteadiness on feet (R26.81);Difficulty in walking, not elsewhere classified (R26.2)     Time: 1025-1101 PT Time Calculation (min) (ACUTE ONLY): 36 min  Charges:  $Therapeutic Activity: 23-37 mins                      Tori Korrina Zern PT, DPT 10/22/19, 1:07 PM

## 2019-10-22 NOTE — TOC Progression Note (Signed)
Transition of Care [facility]) - Progression Note    Patient Details  Name: Leslie Dean MRN: [id number] Date of Birth: 18-Aug-1945  [facility] CM/SW Contact  Geni Bers, RN Phone Number: 10/22/2019, 2:22 PM  Clinical Narrative:     Spoke with pt and daughter [name] at bedside concerning SNF choice. Daughter states, " We will know in an hour."  CM will call back in an hour.   Expected Discharge Plan: Skilled Nursing Facility Barriers to Discharge: SNF Pending bed offer  Expected Discharge Plan and Services Expected Discharge Plan: Skilled Nursing Facility   Discharge Planning Services: CM Consult Post Acute Care Choice: Durable Medical Equipment, Home Health Living arrangements for the past 2 months: Single Family Home                                       Social Determinants of Health (SDOH) Interventions    Readmission Risk Interventions No flowsheet data found.

## 2019-10-23 LAB — CULTURE, BLOOD (ROUTINE X 2)
Culture: NO GROWTH
Culture: NO GROWTH
Special Requests: ADEQUATE
Special Requests: ADEQUATE

## 2019-10-23 LAB — CBC WITH DIFFERENTIAL/PLATELET
Abs Immature Granulocytes: 0.08 10*3/uL — ABNORMAL HIGH (ref 0.00–0.07)
Basophils Absolute: 0 10*3/uL (ref 0.0–0.1)
Basophils Relative: 0 %
Eosinophils Absolute: 0.1 10*3/uL (ref 0.0–0.5)
Eosinophils Relative: 1 %
HCT: 32.1 % — ABNORMAL LOW (ref 36.0–46.0)
Hemoglobin: 10.2 g/dL — ABNORMAL LOW (ref 12.0–15.0)
Immature Granulocytes: 1 %
Lymphocytes Relative: 15 %
Lymphs Abs: 1.8 10*3/uL (ref 0.7–4.0)
MCH: 29.7 pg (ref 26.0–34.0)
MCHC: 31.8 g/dL (ref 30.0–36.0)
MCV: 93.3 fL (ref 80.0–100.0)
Monocytes Absolute: 1.3 10*3/uL — ABNORMAL HIGH (ref 0.1–1.0)
Monocytes Relative: 12 %
Neutro Abs: 8.2 10*3/uL — ABNORMAL HIGH (ref 1.7–7.7)
Neutrophils Relative %: 71 %
Platelets: 176 10*3/uL (ref 150–400)
RBC: 3.44 MIL/uL — ABNORMAL LOW (ref 3.87–5.11)
RDW: 14.8 % (ref 11.5–15.5)
WBC: 11.5 10*3/uL — ABNORMAL HIGH (ref 4.0–10.5)
nRBC: 0 % (ref 0.0–0.2)

## 2019-10-23 LAB — COMPREHENSIVE METABOLIC PANEL
ALT: 25 U/L (ref 0–44)
AST: 24 U/L (ref 15–41)
Albumin: 2.3 g/dL — ABNORMAL LOW (ref 3.5–5.0)
Alkaline Phosphatase: 71 U/L (ref 38–126)
Anion gap: 6 (ref 5–15)
BUN: 15 mg/dL (ref 8–23)
CO2: 27 mmol/L (ref 22–32)
Calcium: 8.9 mg/dL (ref 8.9–10.3)
Chloride: 105 mmol/L (ref 98–111)
Creatinine, Ser: 0.59 mg/dL (ref 0.44–1.00)
GFR calc Af Amer: >60 mL/min (ref >60)
GFR calc non Af Amer: >60 mL/min (ref >60)
Glucose, Bld: 115 mg/dL — ABNORMAL HIGH (ref 70–99)
Potassium: 3.9 mmol/L (ref 3.5–5.1)
Sodium: 138 mmol/L (ref 135–145)
Total Bilirubin: 1.3 mg/dL — ABNORMAL HIGH (ref 0.3–1.2)
Total Protein: 5.5 g/dL — ABNORMAL LOW (ref 6.5–8.1)

## 2019-10-23 LAB — MAGNESIUM: Magnesium: 2.1 mg/dL (ref 1.7–2.4)

## 2019-10-23 LAB — PHOSPHORUS: Phosphorus: 3.1 mg/dL (ref 2.5–4.6)

## 2019-10-23 MED ORDER — GUAIFENESIN-DM 100-10 MG/5ML PO SYRP
5.0000 mL | ORAL_SOLUTION | ORAL | Status: DC | PRN
  Filled 2019-10-23: qty 10

## 2019-10-23 NOTE — Progress Notes (Signed)
Physical Therapy Treatment Patient Details Name: Leslie Dean MRN: [id number] DOB: June 08, 1946 Today's Date: 10/23/2019    History of Present Illness Patient is a 74 year old female with chronic back pain secondary to scoliosis, spinal stenosis, hypertension, Marfan syndrome, urinary incontinence presented to ED 10/18/19 with fall at home resulting in left hip pain. Xray-Comminuted intertrochanteric femur fracture proximally .S/P IM nailing 10/19/19    PT Comments    Pt continues to require max encouragement and education to participate with therapy and to ease anxiousness. Pt's son and daughter present, want to learn how to assist with mobility and assist with session. Pt with fair quad activation with therapeutic exercises, able to perform quad contraction and AP with good control, but limited with SAQ and abd/add requiring assist from therapist. Pt's son and daughter in room cheering pt on, much encouragement and taking noted to perform exercises with pt later today. Pt comes to EOB with therapist and daughter at back, very resistive and yells out in pain. Once sitting EOB, pt denies pain and reports that wasn't as bad as she thought it was going to be. Pt too fatigued with therapeutic exercises to perform STS or take steps to [facility], but able to assist therapist in scooting towards [facility] with max assist and pushing through BUE to assist. Return to supine with total assist, therapist lifting BLE back into bed and daughter cued to assist pt's trunk down to supine. Pt requires total assist for rolling R and L to change wet bed pad; upon rolling noted pt;s entire bed is soiled with urine. Scooted pt up in bed with max assist and bed assist; notified nurse tech that pt's bedding is wet and she reported to room to change linens at EOS. Constant education, step by step instruction, cues, and demonstration by therapist to pt and pt's family for proper and safe mobility; all verbalized understanding. Pt on RA with  O2 sat >93%, but decreased to 88% once in sitting with increased work of breathing, able to rebound to 93% with seated PLB; provided supplemental O2 while seated for comfort to decrease anxiousness and removed at EOS due to SpO2>92%. Patient will benefit from continued physical therapy in hospital and recommendations below to increase strength, balance, endurance for safe ADLs and gait.   Follow Up Recommendations  SNF;Supervision/Assistance - 24 hour     Equipment Recommendations  Wheelchair (measurements PT);Wheelchair cushion (measurements PT)    Recommendations for Other Services       Precautions / Restrictions Precautions Precautions: Fall Precaution Comments: very resistant, L 4th rib fx Restrictions Weight Bearing Restrictions: Yes LLE Weight Bearing: Partial weight bearing LLE Partial Weight Bearing Percentage or Pounds: 50    Mobility  Bed Mobility   Bed Mobility: Supine to Sit;Sit to Supine;Rolling Rolling: Total assist;+2 for physical assistance;+2 for safety/equipment  Supine to sit: Max assist;+2 for physical assistance Sit to supine: Total assist;+2 for physical assistance  General bed mobility comments: max assist for supine to sit with constant verbal cues to avoid holding onto bedrails and sequencing cues to ease anxiousness; total assist for return to supine elevating BLE back into bed and positioning trunk safely; total assist with rolling R and L to change bed pad with constant cues for hand placement to assist; max assist to scoot up in bed with bed assist  Transfers    General transfer comment: unable 2* fatigue with exercises  Ambulation/Gait    General Gait Details: unable 2* fatigue with exercises   Stairs  Wheelchair Mobility    Modified Rankin (Stroke Patients Only)       Balance Overall balance assessment: Needs assistance;History of Falls Sitting-balance support: Bilateral upper extremity supported;Feet  supported Sitting balance-Leahy Scale: Poor Sitting balance - Comments: avoiding LLE, propped on R forearm with heavy assist to achieve midline Postural control: Right lateral lean                 Cognition Arousal/Alertness: Awake/alert Behavior During Therapy: Anxious Overall Cognitive Status: Within Functional Limits for tasks assessed                  General Comments: alert and conversational, very resistive to mobility requiring max encouragement and education with daughter and son present      Exercises General Exercises - Lower Extremity Ankle Circles/Pumps: Supine;Both;10 reps Quad Sets: Supine;Left;10 reps Short Arc Quad: Supine;Left;10 reps;AAROM Hip ABduction/ADduction: Supine;Left;10 reps;AAROM    General Comments General comments (skin integrity, edema, etc.): SpO2 >93% with bed mobility and therapeutic exercises; desat to 88% with sitting EOB but able to recover to 93% with pursed lip breathing cues, donned 2L O2 for pt comfort due to anxiousness      Pertinent Vitals/Pain Pain Assessment: Faces Faces Pain Scale: Hurts little more Pain Location: back, L hip Pain Descriptors / Indicators: Aching;Discomfort;Moaning;Grimacing Pain Intervention(s): Limited activity within patient's tolerance;Monitored during session;Repositioned    Home Living             Prior Function            PT Goals (current goals can now be found in the care plan section) Acute Rehab PT Goals Patient Stated Goal: to go home PT Goal Formulation: With patient Time For Goal Achievement: 11/02/19 Potential to Achieve Goals: Fair Progress towards PT goals: Progressing toward goals    Frequency    Min 3X/week      PT Plan Current plan remains appropriate    Co-evaluation              AM-PAC PT "6 Clicks" Mobility   Outcome Measure  Help needed turning from your back to your side while in a flat bed without using bedrails?: Total Help needed moving from  lying on your back to sitting on the side of a flat bed without using bedrails?: Total Help needed moving to and from a bed to a chair (including a wheelchair)?: Total Help needed standing up from a chair using your arms (e.g., wheelchair or bedside chair)?: Total Help needed to walk in hospital room?: Total Help needed climbing 3-5 steps with a railing? : Total 6 Click Score: 6    End of Session Equipment Utilized During Treatment: Oxygen Activity Tolerance: Patient limited by pain;Other (comment)(pt resistive to mobility) Patient left: in bed;with call bell/phone within reach;with bed alarm set;with family/visitor present Nurse Communication: Mobility status;Other (comment)(wet bedding) PT Visit Diagnosis: Unsteadiness on feet (R26.81);Difficulty in walking, not elsewhere classified (R26.2)     Time: 4540-9811 PT Time Calculation (min) (ACUTE ONLY): 48 min  Charges:  $Therapeutic Exercise: 8-22 mins $Therapeutic Activity: 23-37 mins                      Tori Brandon Wiechman PT, DPT 10/23/19, 11:04 AM [phone]

## 2019-10-23 NOTE — Progress Notes (Addendum)
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  Has been performing her physical therapy exercises aided by her children.   Assessment & Plan:   Principal Problem:   Closed left hip fracture (HCC) Active Problems:   HTN (hypertension)   Marfans syndrome   Acute respiratory failure (HCC)   Acute lower UTI   LEFT proximal femur fracture -S/p 5/10 open reduction, internal fixation.  See procedure below  -CIR has evaluated patient and declined admission. -5/13 patient counseled at length that she must participate with PT/OT if she is to prevent sequela of not using her new joint; i.e. scar tissue buildup causing patient not to be able to use left hip possibly leading to her being wheelchair-bound.  Daughter present -5/15 patient performing physical therapy exercises aided by her children  Pain management -Patient has been resistant to working with PT/OT -5/13 Norco 7.5-325 mg (hold) -5/13 Robaxin 500 mg (hold) -5/13 morphine discontinued -5/13 Percocet 7.5--325 mg discontinue -5/13 Tylenol PRN -5/13 tramadol 50 mg PRN  Chest/abdominal pain -Most likely secondary to malpositioning of patient; previous fracture clavicle LEFT shoulder -Repeat EKG showed PVCs.  No significant change from prior EKG in 2014  Respiratory failure with hypoxia -Most likely iatrogenic secondary to extensive amount of  narcotics. -See pain management  Hypotension on admission -Normal saline 102ml/hr -Resolved  Marfan syndrome, scoliosis -See pain management   LEFT fourth fractured rib -Most likely secondary to patient's fall -See pain management -Incentive spirometry  Goals of care -5/14 Reconsult inpatient Rehab patient now more alert and interactive.  Patient should be a good candidate for CIR.    DVT prophylaxis: ASA 81 mg per orthopedic surgery Code Status: Full Family Communication: 5/15 daughter at bedside discussed plan of care answered all questions Disposition Plan: 5/12 consult LCSW patient will require SNF placement as CIR has declined admission. Status is: Inpatient  Dispo: The patient is from: Home              Anticipated d/c is to: CIR              Anticipated d/c date is:??              Patient currently unstable      Consultants:  Orthopedic surgery Dr. Paralee Cancel :  Procedures/Significant Events:  5/10 Open reduction internal fixation of left proximal femur fracture utilizing a [employer] Aff[employer]us long nail, 11 mm x 380 mm with a 115 mm lag screw and a distal interlock. 5/13 CXR;-findings suggestive of left fourth rib fracture with some associated pleural thickening. No pneumothorax    I have personally reviewed and interpreted all radiology studies and my findings are as above.  VENTILATOR SETTINGS:    Cultures   Antimicrobials:    Devices    LINES / TUBES:      Continuous Infusions: . sodium chloride 75 mL/hr at 10/20/19 0359  Objective: Vitals:   10/22/19 0502 10/22/19 1339 10/22/19 2123 10/23/19 0551  BP: 137/77 131/60 127/65 118/67  Pulse: 79 82 82 83  Resp:  20 17 19   Temp: 98.6 F (37 C) 99.1 F (37.3 C) 98.4 F (36.9 C) 98.6 F (37 C)  TempSrc: Oral Oral Oral Oral  SpO2: 98% 94% 93% 91%  Weight:      Height:        Intake/Output Summary (Last 24 hours) at 10/23/2019 0802 Last data filed at 10/22/2019  2214 Gross per 24 hour  Intake 50 ml  Output 700 ml  Net -650 ml   Filed Weights   10/18/19 1700  Weight: 88.9 kg   Physical Exam:  General: A/O x4, no acute respiratory distress Eyes: negative scleral hemorrhage, negative anisocoria, negative icterus ENT: Negative Runny nose, negative gingival bleeding, Neck:  Negative scars, masses, torticollis, lymphadenopathy, JVD Lungs: Clear to auscultation bilaterally without wheezes or crackles Cardiovascular: Regular rate and rhythm without murmur gallop or rub normal S1 and S2 Abdomen: OBESE, negative abdominal pain, nondistended, positive soft, bowel sounds, no rebound, no ascites, no appreciable mass Extremities: No significant cyanosis, clubbing.  LEFT thigh/hip swollen, negative warmth to touch,  Skin: Negative rashes, lesions, ulcers Psychiatric:  Negative depression, negative anxiety, negative fatigue, negative mania  Central nervous system:  Cranial nerves II through XII intact, tongue/uvula midline, all extremities muscle strength 5/5, sensation intact throughout, negative dysarthria, negative expressive aphasia, negative receptive aphasia..      Data Reviewed: Care during the described time interval was provided by me .  I have reviewed this patient's available data, including medical history, events of note, physical examination, and all test results as part of my evaluation.  CBC: Recent Labs  Lab 10/18/19 0853 10/18/19 0853 10/19/19 0605 10/20/19 0537 10/21/19 0554 10/22/19 0439 10/23/19 0418  WBC 19.7*   < > 15.0* 11.8* 12.5* 13.1* 11.5*  NEUTROABS 17.4*  --   --   --  9.6* 9.8* 8.2*  HGB 13.9   < > 11.1* 10.2* 10.1* 10.4* 10.2*  HCT 44.5   < > 36.6 33.2* 32.7* 33.3* 32.1*  MCV 94.3   < > 97.3 96.2 93.7 93.0 93.3  PLT 218   < > 152 138* 142* 154 176   < > = values in this interval not displayed.   Basic Metabolic Panel: Recent Labs  Lab 10/19/19 0605 10/20/19 0537 10/21/19 0554 10/22/19 0439 10/23/19 0418   NA 142 140 140 140 138  K 4.7 4.4 4.3 4.3 3.9  CL 106 107 106 107 105  CO2 28 26 24 27 27   GLUCOSE 117* 123* 119* 119* 115*  BUN 14 9 12 15 15   CREATININE 0.83 0.75 0.58 0.60 0.59  CALCIUM 8.6* 9.0 9.0 9.0 8.9  MG  --   --  2.2 2.1 2.1  PHOS  --   --  2.4* 2.5 3.1   GFR: Estimated Creatinine Clearance: 71.7 mL/min (by C-G formula based on SCr of 0.59 mg/dL). Liver Function Tests: Recent Labs  Lab 10/18/19 1844 10/21/19 0554 10/22/19 0439 10/23/19 0418  AST  --  18 15 24   ALT  --  13 15 25   ALKPHOS  --  64 65 71  BILITOT  --  1.1 1.2 1.3*  PROT  --  5.6* 5.7* 5.5*  ALBUMIN 3.4* 2.6* 2.6* 2.3*   No results for input(s): LIPASE, AMYLASE in the last 168 hours. No results for input(s): AMMONIA in the last 168 hours. Coagulation  Profile: No results for input(s): INR, PROTIME in the last 168 hours. Cardiac Enzymes: Recent Labs  Lab 10/18/19 1115  CKTOTAL 230   BNP (last 3 results) No results for input(s): PROBNP in the last 8760 hours. HbA1C: No results for input(s): HGBA1C in the last 72 hours. CBG: No results for input(s): GLUCAP in the last 168 hours. Lipid Profile: No results for input(s): CHOL, HDL, LDLCALC, TRIG, CHOLHDL, LDLDIRECT in the last 72 hours. Thyroid Function Tests: No results for input(s): TSH, T4TOTAL, FREET4, T3FREE, THYROIDAB in the last 72 hours. Anemia Panel: No results for input(s): VITAMINB12, FOLATE, FERRITIN, TIBC, IRON, RETICCTPCT in the last 72 hours. Sepsis Labs: No results for input(s): PROCALCITON, LATICACIDVEN in the last 168 hours.  Recent Results (from the past 240 hour(s))  SARS Coronavirus 2 by RT PCR (hospital order, performed in [facility] hospital lab) Nasopharyngeal Nasopharyngeal Swab     Status: None   Collection Time: 10/18/19  8:53 AM   Specimen: Nasopharyngeal Swab  Result Value Ref Range Status   SARS Coronavirus 2 NEGATIVE NEGATIVE Final    Comment: (NOTE) SARS-CoV-2 target nucleic acids are NOT DETECTED. The  SARS-CoV-2 RNA is generally detectable in upper and lower respiratory specimens during the acute phase of infection. The lowest concentration of SARS-CoV-2 viral copies this assay can detect is 250 copies / mL. A negative result does not preclude SARS-CoV-2 infection and should not be used as the sole basis for treatment or other patient management decisions.  A negative result may occur with improper specimen collection / handling, submission of specimen other than nasopharyngeal swab, presence of viral mutation(s) within the areas targeted by this assay, and inadequate number of viral copies (<250 copies / mL). A negative result must be combined with clinical observations, patient history, and epidemiological information. Fact Sheet for Patients:   BoilerBrush.com.cy Fact Sheet for Healthcare Providers: https://pope.com/ This test is not yet approved or cleared  by the [place] FDA and has been authorized for detection and/or diagnosis of SARS-CoV-2 by FDA under an Emergency Use Authorization (EUA).  This EUA will remain in effect (meaning this test can be used) for the duration of the COVID-19 declaration under Section 564(b)(1) of the Act, 21 U.S.C. section 360bbb-3(b)(1), unless the authorization is terminated or revoked sooner. Performed at [facility], [address], [place], [place] [address]   Urine Culture     Status: Abnormal   Collection Time: 10/18/19 11:15 AM   Specimen: Urine, Random  Result Value Ref Range Status   Specimen Description   Final    URINE, RANDOM Performed at [facility], [address], [place], [place] [address]    Special Requests   Final    NONE Performed at [facility], [address] [place], [place] [address]    Culture (A)  Final    >=100,000 COLONIES/mL SERRATIA MARCESCENS >=100,000 COLONIES/mL ESCHERICHIA COLI    Report  Status 10/22/2019 FINAL  Final   Organism ID, Bacteria SERRATIA MARCESCENS (A)  Final   Organism ID, Bacteria ESCHERICHIA COLI (A)  Final      Susceptibility   Escherichia coli - MIC*    AMPICILLIN 16 INTERMEDIATE Intermediate     CEFAZOLIN >=64 RESISTANT Resistant     CEFTRIAXONE <=1 SENSITIVE Sensitive     CIPROFLOXACIN <=0.25 SENSITIVE Sensitive     GENTAMICIN <=1 SENSITIVE Sensitive     IMIPENEM 0.5 SENSITIVE Sensitive     NITROFURANTOIN 256 RESISTANT Resistant  TRIMETH/SULFA <=20 SENSITIVE Sensitive     AMPICILLIN/SULBACTAM 8 SENSITIVE Sensitive     PIP/TAZO <=4 SENSITIVE Sensitive     * >=100,000 COLONIES/mL ESCHERICHIA COLI   Serratia marcescens - MIC*    CEFAZOLIN >=64 RESISTANT Resistant     CEFTRIAXONE <=1 SENSITIVE Sensitive     CIPROFLOXACIN <=0.25 SENSITIVE Sensitive     GENTAMICIN <=1 SENSITIVE Sensitive     NITROFURANTOIN RESISTANT Resistant     TRIMETH/SULFA <=20 SENSITIVE Sensitive     * >=100,000 COLONIES/mL SERRATIA MARCESCENS  Culture, blood (Routine X 2) w Reflex to ID Panel     Status: None   Collection Time: 10/18/19 11:16 AM   Specimen: BLOOD  Result Value Ref Range Status   Specimen Description   Final    BLOOD RIGHT ANTECUBITAL Performed at [facility], [address] [place], [place] [address]    Special Requests   Final    BOTTLES DRAWN AEROBIC AND ANAEROBIC Blood Culture adequate volume Performed at [facility], [address] [place], [place] [address]    Culture   Final    NO GROWTH 5 DAYS Performed at [facility], [address] [place], [place] [address]    Report Status 10/23/2019 FINAL  Final  Culture, blood (Routine X 2) w Reflex to ID Panel     Status: None   Collection Time: 10/18/19 11:16 AM   Specimen: BLOOD LEFT HAND  Result Value Ref Range Status   Specimen Description   Final    BLOOD LEFT HAND Performed at [facility], [address] [place], [place]  [address]    Special Requests   Final    BOTTLES DRAWN AEROBIC AND ANAEROBIC Blood Culture adequate volume Performed at [facility], [address] [place], [place] [address]    Culture   Final    NO GROWTH 5 DAYS Performed at [facility], [address] [place], [place] [address]    Report Status 10/23/2019 FINAL  Final  Surgical PCR screen     Status: Abnormal   Collection Time: 10/20/19  4:57 AM   Specimen: Nasal Mucosa; Nasal Swab  Result Value Ref Range Status   MRSA, PCR NEGATIVE NEGATIVE Final   Staphylococcus aureus POSITIVE (A) NEGATIVE Final    Comment: (NOTE) The Xpert SA Assay (FDA approved for NASAL specimens in patients 74 years of age and older), is one component of a comprehensive surveillance program. It is not intended to diagnose infection nor to guide or monitor treatment. Performed at [facility], [address] [place], [place] [address]          Radiology Studies: DG Chest 2 View  Result Date: 10/21/2019 CLINICAL DATA:  Recent hip fixation EXAM: CHEST - 2 VIEW COMPARISON:  10/18/2019 FINDINGS: Cardiac shadow is enlarged but stable. The lungs are well aerated bilaterally. No focal infiltrate or sizable effusion is noted. Chronic bronchitic changes are again seen and stable. Aortic calcifications are noted. Mild pleural thickening is noted on the left new from the prior exam. This may be related to mild patient rotation. Some suggestion of a left fourth rib fracture laterally is noted. No pneumothorax is seen. IMPRESSION: Stable bronchitic markings. Findings suggestive of left fourth rib fracture with some associated pleural thickening. No pneumothorax is noted. Electronically Signed   By: Alcide CleverMark  Lukens M.D.   On: 10/21/2019 15:45        Scheduled Meds: . aspirin  EC  325 mg Oral Q breakfast  . cholecalciferol  2,000 Units Oral Daily  . diclofenac  1 patch Transdermal BID  . ferrous sulfate  325 mg Oral TID  PC  . magnesium oxide  400 mg Oral Daily  . metoprolol succinate  12.5 mg Oral Daily  . pantoprazole  40 mg Oral Daily  . trazodone  300 mg Oral QHS  . zinc sulfate  220 mg Oral Daily   Continuous Infusions: . sodium chloride 75 mL/hr at 10/20/19 0359     LOS: 5 days    Time spent:40 min   Abbrielle Batts, Roselind Messier, MD [facility] Pager [phone]  If 7PM-7AM, please contact night-coverage [email or web address] Password [email or web address] 10/23/2019, 8:02 AM

## 2019-10-23 NOTE — Progress Notes (Signed)
PROGRESS NOTE    Leslie Dean  PYK:[id number] DOB: 21-Nov-1945 DOA: 10/18/2019 PCP: Patient, No Pcp Per     Brief Narrative:  74 year old WF (retired Therapist, sports from [employer]) PMHx Marfan syndrome,Chronic back pain with some scoliosis and on multiple meds previously not on any now,HTN Prior admission large hepatic cyst 12/06/2012-(was benign)   Accidental fall-immediate left hip pain 10/10-Rx NAD 200 mcg fentanyl 1 mg Dilaudid 4 mg morphine = hypoxia in ED  EKG on admit RBBB LPF B Labs pretty normal other than white count 19 on admission    Subjective: 5/14 afebrile overnight, A/O x4, negative CP, positive lateral chest wall pain (broken rib on THE left).  Stated worked with PT ambulated 3 steps today from bed to chair      Assessment & Plan:   Principal Problem:   Closed left hip fracture ([place]) Active Problems:   HTN (hypertension)   Marfans syndrome   Acute respiratory failure ([place])   Acute lower UTI   LEFT proximal femur fracture -S/p 5/10 open reduction, internal fixation. See procedure below  -CIR has evaluated patient and declined admission. -5/13 patient counseled at length that she must participate with PT/OT if she is to prevent sequela of not using her new joint; i.e. scar tissue buildup causing patient not to be able to use left hip possibly leading to her being wheelchair-bound. Daughter present  Pain management -Patient has been resistant to working with PT/OT -5/13 Norco 7.5-325 mg(hold) -5/13 Robaxin 500 mg (hold) -5/13 morphine discontinued -5/13 Percocet 7.5--325 mg discontinue -5/13 TylenolPRN -5/13 tramadol 50 mgPRN  Chest/abdominal pain -Most likely secondary to malpositioning of patient; previous fracture clavicle LEFT shoulder -Repeat EKG showed PVCs. No significant change from prior EKG in 2014  Respiratory failure with hypoxia -Most likely iatrogenic secondary to extensive amount of narcotics. -See pain management  Hypotension  on admission -Normal saline 60ml/hr -Resolved  Marfan syndrome, scoliosis -See pain management   LEFT fourth fractured rib -Most likely secondary to patient's fall -See pain management -Incentive spirometry  Goals of care -5/14 Reconsult inpatient Rehab patient now more alert and interactive.  Patient should be a good candidate for CIR.    DVT prophylaxis: ASA 81 mg per orthopedic surgery Code Status: Full Family Communication: 5/14 daughter at bedside discussed plan of care answered all questions Disposition Plan: 5/12 consult LCSW patient will require SNF placement as CIR has declined admission. Status is: Inpatient   Dispo: The patient is from: Home              Anticipated d/c is to: CIR vs SNF              Anticipated d/c date is: 5/20              Patient currently unstable      Consultants:  Orthopedic surgery Dr. Paralee Cancel :   Procedures/Significant Events:  5/10 Open reduction internal fixation of left proximal femur fracture utilizing a [employer] long nail, 11 mm x 380 mm with a 115 mm lag screw and a distal interlock. 5/13 CXR;-findings suggestive of left fourth rib fracture with some associated pleural thickening. No pneumothorax    I have personally reviewed and interpreted all radiology studies and my findings are as above.  VENTILATOR SETTINGS:    Cultures   Antimicrobials:    Devices    LINES / TUBES:      Continuous Infusions: . sodium chloride 75 mL/hr at 10/20/19 0359  Objective: Vitals:   10/22/19 0502 10/22/19 1339 10/22/19 2123 10/23/19 0551  BP: 137/77 131/60 127/65 118/67  Pulse: 79 82 82 83  Resp:  20 17 19   Temp: 98.6 F (37 C) 99.1 F (37.3 C) 98.4 F (36.9 C) 98.6 F (37 C)  TempSrc: Oral Oral Oral Oral  SpO2: 98% 94% 93% 91%  Weight:      Height:        Intake/Output Summary (Last 24 hours) at 10/23/2019 0805 Last data filed at 10/22/2019 2214 Gross per 24 hour  Intake 50 ml   Output 700 ml  Net -650 ml   Filed Weights   10/18/19 1700  Weight: 88.9 kg    Examination:  General: Sleepy but arousable A/O x4, no acute respiratory distress Eyes: negative scleral hemorrhage, negative anisocoria, negative icterus ENT: Negative Runny nose, negative gingival bleeding, Neck:  Negative scars, masses, torticollis, lymphadenopathy, JVD Lungs: Clear to auscultation bilaterally without wheezes or crackles Cardiovascular: Regular rate and rhythm without murmur gallop or rub normal S1 and S2 Abdomen: OBESE, negative abdominal pain, nondistended, positive soft, bowel sounds, no rebound, no ascites, no appreciable mass Extremities: No significant cyanosis, clubbing.  Left thigh/hip swollen, negative warmth to touch (appropriately so post surgery) Skin: Negative rashes, lesions, ulcers Psychiatric:  Negative depression, negative anxiety, negative fatigue, negative mania  Central nervous system:  Cranial nerves II through XII intact, tongue/uvula midline, all extremities muscle strength 5/5, sensation intact throughout,  negative dysarthria, negative expressive aphasia, negative receptive aphasia .     Data Reviewed: Care during the described time interval was provided by me .  I have reviewed this patient's available data, including medical history, events of note, physical examination, and all test results as part of my evaluation.  CBC: Recent Labs  Lab 10/18/19 0853 10/18/19 0853 10/19/19 0605 10/20/19 0537 10/21/19 0554 10/22/19 0439 10/23/19 0418  WBC 19.7*   < > 15.0* 11.8* 12.5* 13.1* 11.5*  NEUTROABS 17.4*  --   --   --  9.6* 9.8* 8.2*  HGB 13.9   < > 11.1* 10.2* 10.1* 10.4* 10.2*  HCT 44.5   < > 36.6 33.2* 32.7* 33.3* 32.1*  MCV 94.3   < > 97.3 96.2 93.7 93.0 93.3  PLT 218   < > 152 138* 142* 154 176   < > = values in this interval not displayed.   Basic Metabolic Panel: Recent Labs  Lab 10/19/19 0605 10/20/19 0537 10/21/19 0554 10/22/19 0439  10/23/19 0418  NA 142 140 140 140 138  K 4.7 4.4 4.3 4.3 3.9  CL 106 107 106 107 105  CO2 28 26 24 27 27   GLUCOSE 117* 123* 119* 119* 115*  BUN 14 9 12 15 15   CREATININE 0.83 0.75 0.58 0.60 0.59  CALCIUM 8.6* 9.0 9.0 9.0 8.9  MG  --   --  2.2 2.1 2.1  PHOS  --   --  2.4* 2.5 3.1   GFR: Estimated Creatinine Clearance: 71.7 mL/min (by C-G formula based on SCr of 0.59 mg/dL). Liver Function Tests: Recent Labs  Lab 10/18/19 1844 10/21/19 0554 10/22/19 0439 10/23/19 0418  AST  --  18 15 24   ALT  --  13 15 25   ALKPHOS  --  64 65 71  BILITOT  --  1.1 1.2 1.3*  PROT  --  5.6* 5.7* 5.5*  ALBUMIN 3.4* 2.6* 2.6* 2.3*   No results for input(s): LIPASE, AMYLASE in the last 168 hours. No results for input(s):  AMMONIA in the last 168 hours. Coagulation Profile: No results for input(s): INR, PROTIME in the last 168 hours. Cardiac Enzymes: Recent Labs  Lab 10/18/19 1115  CKTOTAL 230   BNP (last 3 results) No results for input(s): PROBNP in the last 8760 hours. HbA1C: No results for input(s): HGBA1C in the last 72 hours. CBG: No results for input(s): GLUCAP in the last 168 hours. Lipid Profile: No results for input(s): CHOL, HDL, LDLCALC, TRIG, CHOLHDL, LDLDIRECT in the last 72 hours. Thyroid Function Tests: No results for input(s): TSH, T4TOTAL, FREET4, T3FREE, THYROIDAB in the last 72 hours. Anemia Panel: No results for input(s): VITAMINB12, FOLATE, FERRITIN, TIBC, IRON, RETICCTPCT in the last 72 hours. Sepsis Labs: No results for input(s): PROCALCITON, LATICACIDVEN in the last 168 hours.  Recent Results (from the past 240 hour(s))  SARS Coronavirus 2 by RT PCR (hospital order, performed in [facility] hospital lab) Nasopharyngeal Nasopharyngeal Swab     Status: None   Collection Time: 10/18/19  8:53 AM   Specimen: Nasopharyngeal Swab  Result Value Ref Range Status   SARS Coronavirus 2 NEGATIVE NEGATIVE Final    Comment: (NOTE) SARS-CoV-2 target nucleic acids are NOT  DETECTED. The SARS-CoV-2 RNA is generally detectable in upper and lower respiratory specimens during the acute phase of infection. The lowest concentration of SARS-CoV-2 viral copies this assay can detect is 250 copies / mL. A negative result does not preclude SARS-CoV-2 infection and should not be used as the sole basis for treatment or other patient management decisions.  A negative result may occur with improper specimen collection / handling, submission of specimen other than nasopharyngeal swab, presence of viral mutation(s) within the areas targeted by this assay, and inadequate number of viral copies (<250 copies / mL). A negative result must be combined with clinical observations, patient history, and epidemiological information. Fact Sheet for Patients:   BoilerBrush.com.cy Fact Sheet for Healthcare Providers: https://pope.com/ This test is not yet approved or cleared  by the [place] FDA and has been authorized for detection and/or diagnosis of SARS-CoV-2 by FDA under an Emergency Use Authorization (EUA).  This EUA will remain in effect (meaning this test can be used) for the duration of the COVID-19 declaration under Section 564(b)(1) of the Act, 21 U.S.C. section 360bbb-3(b)(1), unless the authorization is terminated or revoked sooner. Performed at [facility], [address] [place], [place] [address]   Urine Culture     Status: Abnormal   Collection Time: 10/18/19 11:15 AM   Specimen: Urine, Random  Result Value Ref Range Status   Specimen Description   Final    URINE, RANDOM Performed at [facility], [address] [place], [place] [address]    Special Requests   Final    NONE Performed at [facility], [address], [place], [place] [address]    Culture (A)  Final    >=100,000 COLONIES/mL SERRATIA MARCESCENS >=100,000 COLONIES/mL ESCHERICHIA COLI     Report Status 10/22/2019 FINAL  Final   Organism ID, Bacteria SERRATIA MARCESCENS (A)  Final   Organism ID, Bacteria ESCHERICHIA COLI (A)  Final      Susceptibility   Escherichia coli - MIC*    AMPICILLIN 16 INTERMEDIATE Intermediate     CEFAZOLIN >=64 RESISTANT Resistant     CEFTRIAXONE <=1 SENSITIVE Sensitive     CIPROFLOXACIN <=0.25 SENSITIVE Sensitive     GENTAMICIN <=1 SENSITIVE Sensitive     IMIPENEM 0.5 SENSITIVE Sensitive  NITROFURANTOIN 256 RESISTANT Resistant     TRIMETH/SULFA <=20 SENSITIVE Sensitive     AMPICILLIN/SULBACTAM 8 SENSITIVE Sensitive     PIP/TAZO <=4 SENSITIVE Sensitive     * >=100,000 COLONIES/mL ESCHERICHIA COLI   Serratia marcescens - MIC*    CEFAZOLIN >=64 RESISTANT Resistant     CEFTRIAXONE <=1 SENSITIVE Sensitive     CIPROFLOXACIN <=0.25 SENSITIVE Sensitive     GENTAMICIN <=1 SENSITIVE Sensitive     NITROFURANTOIN RESISTANT Resistant     TRIMETH/SULFA <=20 SENSITIVE Sensitive     * >=100,000 COLONIES/mL SERRATIA MARCESCENS  Culture, blood (Routine X 2) w Reflex to ID Panel     Status: None   Collection Time: 10/18/19 11:16 AM   Specimen: BLOOD  Result Value Ref Range Status   Specimen Description   Final    BLOOD RIGHT ANTECUBITAL Performed at [facility], [address] [place], [place] [address]    Special Requests   Final    BOTTLES DRAWN AEROBIC AND ANAEROBIC Blood Culture adequate volume Performed at [facility], [address] [place], [place] [address]    Culture   Final    NO GROWTH 5 DAYS Performed at [facility], [address] [place], [place] [address]    Report Status 10/23/2019 FINAL  Final  Culture, blood (Routine X 2) w Reflex to ID Panel     Status: None   Collection Time: 10/18/19 11:16 AM   Specimen: BLOOD LEFT HAND  Result Value Ref Range Status   Specimen Description   Final    BLOOD LEFT HAND Performed at [facility], [address]  [place], [place] [address]    Special Requests   Final    BOTTLES DRAWN AEROBIC AND ANAEROBIC Blood Culture adequate volume Performed at [facility], [address] [place], [place] [address]    Culture   Final    NO GROWTH 5 DAYS Performed at [facility], [address] [place], [place] [address]    Report Status 10/23/2019 FINAL  Final  Surgical PCR screen     Status: Abnormal   Collection Time: 10/20/19  4:57 AM   Specimen: Nasal Mucosa; Nasal Swab  Result Value Ref Range Status   MRSA, PCR NEGATIVE NEGATIVE Final   Staphylococcus aureus POSITIVE (A) NEGATIVE Final    Comment: (NOTE) The Xpert SA Assay (FDA approved for NASAL specimens in patients 74 years of age and older), is one component of a comprehensive surveillance program. It is not intended to diagnose infection nor to guide or monitor treatment. Performed at [facility], [address] [place], [place] [address]          Radiology Studies: DG Chest 2 View  Result Date: 10/21/2019 CLINICAL DATA:  Recent hip fixation EXAM: CHEST - 2 VIEW COMPARISON:  10/18/2019 FINDINGS: Cardiac shadow is enlarged but stable. The lungs are well aerated bilaterally. No focal infiltrate or sizable effusion is noted. Chronic bronchitic changes are again seen and stable. Aortic calcifications are noted. Mild pleural thickening is noted on the left new from the prior exam. This may be related to mild patient rotation. Some suggestion of a left fourth rib fracture laterally is noted. No pneumothorax is seen. IMPRESSION: Stable bronchitic markings. Findings suggestive of left fourth rib fracture with some associated pleural thickening. No pneumothorax is noted. Electronically Signed   By: Alcide CleverMark  Lukens M.D.   On: 10/21/2019 15:45  Scheduled Meds: . aspirin EC  325 mg Oral Q breakfast  . cholecalciferol  2,000 Units Oral Daily  . diclofenac  1 patch Transdermal BID  . ferrous sulfate   325 mg Oral TID PC  . magnesium oxide  400 mg Oral Daily  . metoprolol succinate  12.5 mg Oral Daily  . pantoprazole  40 mg Oral Daily  . trazodone  300 mg Oral QHS  . zinc sulfate  220 mg Oral Daily   Continuous Infusions: . sodium chloride 75 mL/hr at 10/20/19 0359     LOS: 5 days    Time spent:40 min    Aniayah Alaniz, Roselind Messier, MD [facility] Pager [phone]  If 7PM-7AM, please contact night-coverage [email or web address] Password [facility] 10/23/2019, 8:05 AM

## 2019-10-24 LAB — COMPREHENSIVE METABOLIC PANEL
ALT: 35 U/L (ref 0–44)
AST: 28 U/L (ref 15–41)
Albumin: 2.4 g/dL — ABNORMAL LOW (ref 3.5–5.0)
Alkaline Phosphatase: 85 U/L (ref 38–126)
Anion gap: 7 (ref 5–15)
BUN: 16 mg/dL (ref 8–23)
CO2: 29 mmol/L (ref 22–32)
Calcium: 9.1 mg/dL (ref 8.9–10.3)
Chloride: 106 mmol/L (ref 98–111)
Creatinine, Ser: 0.69 mg/dL (ref 0.44–1.00)
GFR calc Af Amer: >60 mL/min (ref >60)
GFR calc non Af Amer: >60 mL/min (ref >60)
Glucose, Bld: 111 mg/dL — ABNORMAL HIGH (ref 70–99)
Potassium: 3.8 mmol/L (ref 3.5–5.1)
Sodium: 142 mmol/L (ref 135–145)
Total Bilirubin: 1.4 mg/dL — ABNORMAL HIGH (ref 0.3–1.2)
Total Protein: 5.5 g/dL — ABNORMAL LOW (ref 6.5–8.1)

## 2019-10-24 LAB — CBC WITH DIFFERENTIAL/PLATELET
Abs Immature Granulocytes: 0.07 10*3/uL (ref 0.00–0.07)
Basophils Absolute: 0 10*3/uL (ref 0.0–0.1)
Basophils Relative: 0 %
Eosinophils Absolute: 0.1 10*3/uL (ref 0.0–0.5)
Eosinophils Relative: 1 %
HCT: 32.5 % — ABNORMAL LOW (ref 36.0–46.0)
Hemoglobin: 10.2 g/dL — ABNORMAL LOW (ref 12.0–15.0)
Immature Granulocytes: 1 %
Lymphocytes Relative: 18 %
Lymphs Abs: 2.1 10*3/uL (ref 0.7–4.0)
MCH: 29.3 pg (ref 26.0–34.0)
MCHC: 31.4 g/dL (ref 30.0–36.0)
MCV: 93.4 fL (ref 80.0–100.0)
Monocytes Absolute: 1.4 10*3/uL — ABNORMAL HIGH (ref 0.1–1.0)
Monocytes Relative: 12 %
Neutro Abs: 7.9 10*3/uL — ABNORMAL HIGH (ref 1.7–7.7)
Neutrophils Relative %: 68 %
Platelets: 204 10*3/uL (ref 150–400)
RBC: 3.48 MIL/uL — ABNORMAL LOW (ref 3.87–5.11)
RDW: 14.9 % (ref 11.5–15.5)
WBC: 11.6 10*3/uL — ABNORMAL HIGH (ref 4.0–10.5)
nRBC: 0 % (ref 0.0–0.2)

## 2019-10-24 LAB — MAGNESIUM: Magnesium: 2.2 mg/dL (ref 1.7–2.4)

## 2019-10-24 LAB — PHOSPHORUS: Phosphorus: 3.6 mg/dL (ref 2.5–4.6)

## 2019-10-24 NOTE — Progress Notes (Signed)
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  In very good spirits  Assessment & Plan:   Principal Problem:   Closed left hip fracture (HCC) Active Problems:   HTN (hypertension)   Marfans syndrome   Acute respiratory failure (HCC)   Acute lower UTI   LEFT proximal femur fracture -S/p 5/10 open reduction, internal fixation.  See procedure below  -CIR has evaluated patient and declined admission. -5/13 patient counseled at length that she must participate with PT/OT if she is to prevent sequela of not using her new joint; i.e. scar tissue buildup causing patient not to be able to use left hip possibly leading to her being wheelchair-bound.  Daughter present -5/16 patient performing physical therapy exercises aided by her children  Pain management -Patient has been resistant to working with PT/OT -5/13 Norco 7.5-325 mg (hold) -5/13 Robaxin 500 mg (hold) -5/13 morphine discontinued -5/13 Percocet 7.5--325 mg discontinue -5/13 Tylenol PRN -5/13 tramadol 50 mg PRN  Chest/abdominal pain -Most likely secondary to malpositioning of patient; previous fracture clavicle LEFT shoulder -Repeat EKG showed PVCs.  No significant change from prior EKG in 2014  Respiratory failure with hypoxia -Most likely iatrogenic secondary to extensive amount of narcotics. -See pain management  Hypotension on admission  -5/16 normal saline KVO -Resolved  Marfan syndrome, scoliosis -See pain management   LEFT fourth fractured rib -Most likely secondary to patient's fall -See pain management -Incentive spirometry  Goals of care -5/14 Reconsult inpatient Rehab patient now more alert and interactive.  Patient should be a good candidate for CIR.    DVT prophylaxis: ASA 81 mg per orthopedic surgery Code Status: Full Family Communication: 5/16 daughter and son at bedside discussed plan of care answered all questions Disposition Plan: 5/15 consulted CIR; awaiting answer   Status is: Inpatient  Dispo: The patient is from: Home              Anticipated d/c is to: CIR              Anticipated d/c date is:??              Patient currently unstable      Consultants:  Orthopedic surgery Dr. Durene Romans :  Procedures/Significant Events:  5/10 Open reduction internal fixation of left proximal femur fracture utilizing a [employer] Aff[employer]us long nail, 11 mm x 380 mm with a 115 mm lag screw and a distal interlock. 5/13 CXR;-findings suggestive of left fourth rib fracture with some associated pleural thickening. No pneumothorax    I have personally reviewed and interpreted all radiology studies and my findings are as above.  VENTILATOR SETTINGS:    Cultures   Antimicrobials:    Devices    LINES / TUBES:      Continuous Infusions: . sodium chloride Stopped (10/21/19 1516)     Objective: Vitals:   10/23/19 1333 10/23/19 2117 10/24/19 2774 10/24/19 1287  BP: (!) 116/57 (!) 127/59 (!) 92/58 (!) 106/51  Pulse: 81 83 80 73  Resp: 20 20 20 17   Temp: 98.9 F (37.2 C) (!) 100.7 F (38.2 C) 98.4 F (36.9 C) 98.1 F (36.7 C)  TempSrc: Oral Oral Oral Oral  SpO2: 94% (!) 88% (!) 88% 96%  Weight:      Height:        Intake/Output Summary (Last 24 hours) at 10/24/2019 0805 Last data filed at 10/24/2019 0600 Gross per 24 hour  Intake 820.67 ml  Output 1200 ml  Net -379.33 ml    Filed Weights   10/18/19 1700  Weight: 88.9 kg   Physical Exam:  General: A/O x4, no acute respiratory distress Eyes: negative scleral hemorrhage, negative anisocoria, negative icterus ENT: Negative Runny nose, negative gingival bleeding, Neck:  Negative scars, masses, torticollis, lymphadenopathy, JVD Lungs: Clear to auscultation bilaterally without wheezes or crackles Cardiovascular: Regular rate and rhythm without murmur gallop or rub normal S1 and S2 Abdomen: OBESE, negative abdominal pain, nondistended, positive soft, bowel sounds, no rebound, no ascites, no appreciable mass Extremities: No significant cyanosis, clubbing.  LEFT thigh/hip swollen, negative warmth to touch,  Skin: Negative rashes, lesions, ulcers Psychiatric:  Negative depression, negative anxiety, negative fatigue, negative mania  Central nervous system:  Cranial nerves II through XII intact, tongue/uvula midline, all extremities muscle strength 5/5, sensation intact throughout, negative dysarthria, negative expressive aphasia, negative receptive aphasia..      Data Reviewed: Care during the described time interval was provided by me .  I have reviewed this patient's available data, including medical history, events of note, physical examination, and all test results as part of my evaluation.  CBC: Recent Labs  Lab 10/18/19 0853 10/19/19 0605 10/20/19 0537 10/21/19 0554 10/22/19 0439 10/23/19 0418 10/24/19 0413  WBC 19.7*   < > 11.8* 12.5* 13.1* 11.5* 11.6*  NEUTROABS 17.4*  --   --  9.6* 9.8* 8.2* 7.9*  HGB 13.9   < > 10.2* 10.1* 10.4* 10.2* 10.2*  HCT 44.5   < > 33.2* 32.7* 33.3* 32.1* 32.5*  MCV 94.3   < > 96.2 93.7 93.0 93.3 93.4  PLT 218   < > 138* 142* 154 176 204   < > = values in this interval not displayed.   Basic Metabolic Panel: Recent Labs  Lab 10/20/19 0537 10/21/19 0554 10/22/19 0439 10/23/19 0418 10/24/19 0413  NA 140 140 140 138 142  K 4.4 4.3 4.3 3.9 3.8  CL 107 106 107 105  106  CO2 26 24 27 27 29   GLUCOSE 123* 119* 119* 115* 111*  BUN 9 12 15 15 16   CREATININE 0.75 0.58 0.60 0.59 0.69  CALCIUM 9.0 9.0 9.0 8.9 9.1  MG  --  2.2 2.1 2.1 2.2  PHOS  --  2.4* 2.5 3.1 3.6   GFR: Estimated Creatinine Clearance: 71.7 mL/min (by C-G formula based on SCr of 0.69 mg/dL). Liver Function Tests: Recent Labs  Lab 10/18/19 1844 10/21/19 0554 10/22/19 0439 10/23/19 0418 10/24/19 0413  AST  --  18 15 24 28   ALT  --  13 15 25  35  ALKPHOS  --  64 65 71 85  BILITOT  --  1.1 1.2 1.3* 1.4*  PROT  --  5.6* 5.7* 5.5* 5.5*  ALBUMIN 3.4* 2.6* 2.6* 2.3* 2.4*   No results for input(s): LIPASE, AMYLASE in the last 168 hours. No results for input(s): AMMONIA in the last 168 hours. Coagulation Profile: No results for  input(s): INR, PROTIME in the last 168 hours. Cardiac Enzymes: Recent Labs  Lab 10/18/19 1115  CKTOTAL 230   BNP (last 3 results) No results for input(s): PROBNP in the last 8760 hours. HbA1C: No results for input(s): HGBA1C in the last 72 hours. CBG: No results for input(s): GLUCAP in the last 168 hours. Lipid Profile: No results for input(s): CHOL, HDL, LDLCALC, TRIG, CHOLHDL, LDLDIRECT in the last 72 hours. Thyroid Function Tests: No results for input(s): TSH, T4TOTAL, FREET4, T3FREE, THYROIDAB in the last 72 hours. Anemia Panel: No results for input(s): VITAMINB12, FOLATE, FERRITIN, TIBC, IRON, RETICCTPCT in the last 72 hours. Sepsis Labs: No results for input(s): PROCALCITON, LATICACIDVEN in the last 168 hours.  Recent Results (from the past 240 hour(s))  SARS Coronavirus 2 by RT PCR (hospital order, performed in [facility] hospital lab) Nasopharyngeal Nasopharyngeal Swab     Status: None   Collection Time: 10/18/19  8:53 AM   Specimen: Nasopharyngeal Swab  Result Value Ref Range Status   SARS Coronavirus 2 NEGATIVE NEGATIVE Final    Comment: (NOTE) SARS-CoV-2 target nucleic acids are NOT DETECTED. The SARS-CoV-2 RNA is generally detectable  in upper and lower respiratory specimens during the acute phase of infection. The lowest concentration of SARS-CoV-2 viral copies this assay can detect is 250 copies / mL. A negative result does not preclude SARS-CoV-2 infection and should not be used as the sole basis for treatment or other patient management decisions.  A negative result may occur with improper specimen collection / handling, submission of specimen other than nasopharyngeal swab, presence of viral mutation(s) within the areas targeted by this assay, and inadequate number of viral copies (<250 copies / mL). A negative result must be combined with clinical observations, patient history, and epidemiological information. Fact Sheet for Patients:   BoilerBrush.com.cy Fact Sheet for Healthcare Providers: https://pope.com/ This test is not yet approved or cleared  by the [place] FDA and has been authorized for detection and/or diagnosis of SARS-CoV-2 by FDA under an Emergency Use Authorization (EUA).  This EUA will remain in effect (meaning this test can be used) for the duration of the COVID-19 declaration under Section 564(b)(1) of the Act, 21 U.S.C. section 360bbb-3(b)(1), unless the authorization is terminated or revoked sooner. Performed at [facility], [address], [place], [place] [address]   Urine Culture     Status: Abnormal   Collection Time: 10/18/19 11:15 AM   Specimen: Urine, Random  Result Value Ref Range Status   Specimen Description   Final    URINE, RANDOM Performed at [facility], [address], [place], [place] [address]    Special Requests   Final    NONE Performed at [facility], [address], [place], [place] [address]    Culture (A)  Final    >=100,000 COLONIES/mL SERRATIA MARCESCENS >=100,000 COLONIES/mL ESCHERICHIA COLI    Report Status 10/22/2019 FINAL  Final   Organism  ID, Bacteria SERRATIA MARCESCENS (A)  Final   Organism ID, Bacteria ESCHERICHIA COLI (A)  Final      Susceptibility   Escherichia coli - MIC*    AMPICILLIN 16 INTERMEDIATE Intermediate     CEFAZOLIN >=64 RESISTANT Resistant     CEFTRIAXONE <=1 SENSITIVE Sensitive     CIPROFLOXACIN <=0.25 SENSITIVE Sensitive     GENTAMICIN <=1 SENSITIVE Sensitive     IMIPENEM 0.5 SENSITIVE Sensitive     NITROFURANTOIN 256 RESISTANT Resistant     TRIMETH/SULFA <=20  SENSITIVE Sensitive     AMPICILLIN/SULBACTAM 8 SENSITIVE Sensitive     PIP/TAZO <=4 SENSITIVE Sensitive     * >=100,000 COLONIES/mL ESCHERICHIA COLI   Serratia marcescens - MIC*    CEFAZOLIN >=64 RESISTANT Resistant     CEFTRIAXONE <=1 SENSITIVE Sensitive     CIPROFLOXACIN <=0.25 SENSITIVE Sensitive     GENTAMICIN <=1 SENSITIVE Sensitive     NITROFURANTOIN RESISTANT Resistant     TRIMETH/SULFA <=20 SENSITIVE Sensitive     * >=100,000 COLONIES/mL SERRATIA MARCESCENS  Culture, blood (Routine X 2) w Reflex to ID Panel     Status: None   Collection Time: 10/18/19 11:16 AM   Specimen: BLOOD  Result Value Ref Range Status   Specimen Description   Final    BLOOD RIGHT ANTECUBITAL Performed at [facility], [address], [place], [place] [address]    Special Requests   Final    BOTTLES DRAWN AEROBIC AND ANAEROBIC Blood Culture adequate volume Performed at [facility], [address], [place], [place] [address]    Culture   Final    NO GROWTH 5 DAYS Performed at [facility], [address] [place], [place] [address]    Report Status 10/23/2019 FINAL  Final  Culture, blood (Routine X 2) w Reflex to ID Panel     Status: None   Collection Time: 10/18/19 11:16 AM   Specimen: BLOOD LEFT HAND  Result Value Ref Range Status   Specimen Description   Final    BLOOD LEFT HAND Performed at [facility], [address], [place], [place] [address]    Special Requests   Final     BOTTLES DRAWN AEROBIC AND ANAEROBIC Blood Culture adequate volume Performed at [facility], [address] [place], [place] [address]    Culture   Final    NO GROWTH 5 DAYS Performed at [facility], [address], [place], [place] [address]    Report Status 10/23/2019 FINAL  Final  Surgical PCR screen     Status: Abnormal   Collection Time: 10/20/19  4:57 AM   Specimen: Nasal Mucosa; Nasal Swab  Result Value Ref Range Status   MRSA, PCR NEGATIVE NEGATIVE Final   Staphylococcus aureus POSITIVE (A) NEGATIVE Final    Comment: (NOTE) The Xpert SA Assay (FDA approved for NASAL specimens in patients 27 years of age and older), is one component of a comprehensive surveillance program. It is not intended to diagnose infection nor to guide or monitor treatment. Performed at [facility], [address], [place], [place] [address]          Radiology Studies: No results found.      Scheduled Meds: . aspirin EC  325 mg Oral Q breakfast  . cholecalciferol  2,000 Units Oral Daily  . diclofenac  1 patch Transdermal BID  . ferrous sulfate  325 mg Oral TID PC  . magnesium oxide  400 mg Oral Daily  . metoprolol succinate  12.5 mg Oral Daily  . pantoprazole  40 mg Oral Daily  . trazodone  300 mg Oral QHS  . zinc sulfate  220 mg Oral Daily   Continuous Infusions: . sodium chloride Stopped (10/21/19 1516)     LOS: 6 days    Time spent:40 min   WOODS, Roselind Messier, MD [facility] Pager [phone]  If 7PM-7AM, please contact night-coverage [email or web address] Password [facility] 10/24/2019, 8:05 AM

## 2019-10-24 NOTE — Progress Notes (Signed)
Inpatient Rehab Admissions Coordinator:   Spoke with patient and two of her children over the phone to discuss potential CIR admission. Pt. Stated interest. Will pursue for potential admit next week, pending bed availability and insurance approval. Pt. Was encouraged to participate with PT/OT as much as she can during her acute stay.   Megan Salon, MS, CCC-SLP Rehab Admissions Coordinator  [phone] (celll) [phone] (office)

## 2019-10-25 LAB — CBC WITH DIFFERENTIAL/PLATELET
Abs Immature Granulocytes: 0.07 10*3/uL (ref 0.00–0.07)
Basophils Absolute: 0 10*3/uL (ref 0.0–0.1)
Basophils Relative: 0 %
Eosinophils Absolute: 0.1 10*3/uL (ref 0.0–0.5)
Eosinophils Relative: 1 %
HCT: 32.9 % — ABNORMAL LOW (ref 36.0–46.0)
Hemoglobin: 10.2 g/dL — ABNORMAL LOW (ref 12.0–15.0)
Immature Granulocytes: 1 %
Lymphocytes Relative: 14 %
Lymphs Abs: 1.7 10*3/uL (ref 0.7–4.0)
MCH: 29.4 pg (ref 26.0–34.0)
MCHC: 31 g/dL (ref 30.0–36.0)
MCV: 94.8 fL (ref 80.0–100.0)
Monocytes Absolute: 1.3 10*3/uL — ABNORMAL HIGH (ref 0.1–1.0)
Monocytes Relative: 11 %
Neutro Abs: 9.4 10*3/uL — ABNORMAL HIGH (ref 1.7–7.7)
Neutrophils Relative %: 73 %
Platelets: 234 10*3/uL (ref 150–400)
RBC: 3.47 MIL/uL — ABNORMAL LOW (ref 3.87–5.11)
RDW: 14.8 % (ref 11.5–15.5)
WBC: 12.6 10*3/uL — ABNORMAL HIGH (ref 4.0–10.5)
nRBC: 0 % (ref 0.0–0.2)

## 2019-10-25 LAB — COMPREHENSIVE METABOLIC PANEL
ALT: 42 U/L (ref 0–44)
AST: 32 U/L (ref 15–41)
Albumin: 2.3 g/dL — ABNORMAL LOW (ref 3.5–5.0)
Alkaline Phosphatase: 95 U/L (ref 38–126)
Anion gap: 9 (ref 5–15)
BUN: 13 mg/dL (ref 8–23)
CO2: 26 mmol/L (ref 22–32)
Calcium: 8.8 mg/dL — ABNORMAL LOW (ref 8.9–10.3)
Chloride: 104 mmol/L (ref 98–111)
Creatinine, Ser: 0.6 mg/dL (ref 0.44–1.00)
GFR calc Af Amer: >60 mL/min (ref >60)
GFR calc non Af Amer: >60 mL/min (ref >60)
Glucose, Bld: 113 mg/dL — ABNORMAL HIGH (ref 70–99)
Potassium: 3.9 mmol/L (ref 3.5–5.1)
Sodium: 139 mmol/L (ref 135–145)
Total Bilirubin: 1.1 mg/dL (ref 0.3–1.2)
Total Protein: 5.4 g/dL — ABNORMAL LOW (ref 6.5–8.1)

## 2019-10-25 LAB — MAGNESIUM: Magnesium: 2.1 mg/dL (ref 1.7–2.4)

## 2019-10-25 LAB — PHOSPHORUS: Phosphorus: 3.5 mg/dL (ref 2.5–4.6)

## 2019-10-25 NOTE — TOC Progression Note (Signed)
Transition of Care [facility]) - Progression Note    Patient Details  Name: Leslie Dean MRN: [id number] Date of Birth: December 15, 1945  Transition of [facility]) CM/SW Contact  Lennart Pall, [place] Phone Number: 10/25/2019, 12:21 PM  Clinical Narrative:   Spoke with CIR admissions contact, Clemens Catholic, this morning who reports she was submitting insurance auth/ review, however, noted this may take 1-2 days for decision.   Met with pt and daughter, Leslie Dean, to alert that we are awaiting that decision and will need SNF choice if CIR is denied.  Daughter states that [facility] is their preferred SNF, however, very hopeful insurance will approve CIR. Will continue to follow.   Expected Discharge Plan: Skilled Nursing Facility Barriers to Discharge: SNF Pending bed offer  Expected Discharge Plan and Services Expected Discharge Plan: [place]   Discharge Planning Services: CM Consult Post Acute Care Choice: Durable Medical Equipment, Home Health Living arrangements for the past 2 months: Single Family Home                                       Social Determinants of Health (SDOH) Interventions    Readmission Risk Interventions No flowsheet data found.

## 2019-10-25 NOTE — Progress Notes (Signed)
PROGRESS NOTE    Leslie Dean  YHC:[id number] DOB: [birth year] DOA: 10/18/2019 PCP: Patient, No Pcp Per     Brief Narrative:  74 year old WF (retired Therapist, sports from [employer]) PMHx Marfan syndrome,Chronic back pain with some scoliosis and on multiple meds previously not on any now,HTN Prior admission large hepatic cyst 12/06/2012-(was benign)   Accidental fall-immediate left hip pain 10/10-Rx NAD 200 mcg fentanyl 1 mg Dilaudid 4 mg morphine = hypoxia in ED  EKG on admit RBBB LPF B Labs pretty normal other than white count 19 on admission    Subjective: 5/17 A/O x4, negative CP, negative abdominal pain, negative S OB.  In good spirits sitting in chair.    Assessment & Plan:   Principal Problem:   Closed left hip fracture (HCC) Active Problems:   HTN (hypertension)   Marfans syndrome   Acute respiratory failure (HCC)   Acute lower UTI   LEFT proximal femur fracture -S/p 5/10 open reduction, internal fixation.  See procedure below  -CIR has evaluated patient and declined admission. -5/13 patient counseled at length that she must participate with PT/OT if she is to prevent sequela of not using her new joint; i.e. scar tissue buildup causing patient not to be able to use left hip possibly leading to her being wheelchair-bound.  Daughter present -5/16 patient performing physical therapy exercises aided by her children  Pain management -Patient has been resistant to working with PT/OT -5/13 Norco 7.5-325 mg (hold) -5/13 Robaxin 500 mg (hold) -5/13 morphine discontinued -5/13 Percocet 7.5--325 mg discontinue -5/13 Tylenol PRN -5/13 tramadol 50 mg PRN  Chest/abdominal pain -Most likely secondary to malpositioning of patient; previous fracture clavicle LEFT shoulder -Repeat EKG showed PVCs.  No significant change from prior EKG in 2014  Respiratory failure with hypoxia -Most likely iatrogenic secondary to extensive amount of narcotics. -See pain management  Hypotension  on admission -5/16 normal saline KVO -Resolved  Marfan syndrome, scoliosis -See pain management   LEFT fourth fractured rib -Most likely secondary to patient's fall -See pain management -Incentive spirometry  Goals of care -5/14 Reconsult inpatient Rehab patient now more alert and interactive.  Patient should be a good candidate for CIR.    DVT prophylaxis: ASA 81 mg per orthopedic surgery Code Status: Full Family Communication: 5/16 daughter and son at bedside discussed plan of care answered all questions Disposition Plan: 5/15 consulted CIR; awaiting answer   Status is: Inpatient  Dispo: The patient is from: Home              Anticipated d/c is to: CIR              Anticipated d/c date is:??              Patient currently unstable      Consultants:  Orthopedic surgery Dr. Paralee Cancel :  Procedures/Significant Events:  5/10 Open reduction internal fixation of left proximal femur fracture utilizing a [employer] Aff[employer]us long nail, 11 mm x 380 mm with a 115 mm lag screw and a distal interlock. 5/13 CXR;-findings suggestive of left fourth rib fracture with some associated pleural thickening. No pneumothorax    I have personally reviewed and interpreted all radiology studies and my findings are as above.  VENTILATOR SETTINGS:    Cultures   Antimicrobials:    Devices    LINES / TUBES:      Continuous Infusions: . sodium chloride 10 mL/hr at 10/24/19 1825     Objective: Vitals:   10/24/19 0650  10/24/19 1042 10/24/19 2144 10/25/19 0517  BP: (!) 106/51 (!) 125/45 124/60 125/64  Pulse: 73 80 80 84  Resp: 17  20 18   Temp: 98.1 F (36.7 C)  99.2 F (37.3 C) 98.9 F (37.2 C)  TempSrc: Oral  Oral Oral  SpO2: 96%   90%  Weight:      Height:        Intake/Output Summary (Last 24 hours) at 10/25/2019 1131 Last data filed at 10/25/2019 0600 Gross per 24 hour  Intake 240 ml  Output 400 ml  Net -160 ml   Filed Weights   10/18/19 1700   Weight: 88.9 kg   Physical Exam:  General: A/O x4, no acute respiratory distress Eyes: negative scleral hemorrhage, negative anisocoria, negative icterus ENT: Negative Runny nose, negative gingival bleeding, Neck:  Negative scars, masses, torticollis, lymphadenopathy, JVD Lungs: Clear to auscultation bilaterally without wheezes or crackles Cardiovascular: Regular rate and rhythm without murmur gallop or rub normal S1 and S2 Abdomen: OBESE, negative abdominal pain, nondistended, positive soft, bowel sounds, no rebound, no ascites, no appreciable mass Extremities: No significant cyanosis, clubbing.  LEFT thigh/hip swollen, negative warmth to touch,  Skin: Negative rashes, lesions, ulcers Psychiatric:  Negative depression, negative anxiety, negative fatigue, negative mania  Central nervous system:  Cranial nerves II through XII intact, tongue/uvula midline, all extremities muscle strength 5/5, sensation intact throughout, negative dysarthria, negative expressive aphasia, negative receptive aphasia..      Data Reviewed: Care during the described time interval was provided by me .  I have reviewed this patient's available data, including medical history, events of note, physical examination, and all test results as part of my evaluation.  CBC: Recent Labs  Lab 10/21/19 0554 10/22/19 0439 10/23/19 0418 10/24/19 0413 10/25/19 0315  WBC 12.5* 13.1* 11.5* 11.6* 12.6*  NEUTROABS 9.6* 9.8* 8.2* 7.9* 9.4*  HGB 10.1* 10.4* 10.2* 10.2* 10.2*  HCT 32.7* 33.3* 32.1* 32.5* 32.9*  MCV 93.7 93.0 93.3 93.4 94.8  PLT 142* 154 176 204 234   Basic Metabolic Panel: Recent Labs  Lab 10/21/19 0554 10/22/19 0439 10/23/19 0418 10/24/19 0413 10/25/19 0315  NA 140 140 138 142 139  K 4.3 4.3 3.9 3.8 3.9  CL 106 107 105 106 104  CO2 24 27 27 29 26   GLUCOSE 119* 119* 115* 111* 113*  BUN 12 15 15 16 13   CREATININE 0.58 0.60 0.59 0.69 0.60  CALCIUM 9.0 9.0 8.9 9.1 8.8*  MG 2.2 2.1 2.1 2.2 2.1   PHOS 2.4* 2.5 3.1 3.6 3.5   GFR: Estimated Creatinine Clearance: 71.7 mL/min (by C-G formula based on SCr of 0.6 mg/dL). Liver Function Tests: Recent Labs  Lab 10/21/19 0554 10/22/19 0439 10/23/19 0418 10/24/19 0413 10/25/19 0315  AST 18 15 24 28  32  ALT 13 15 25  35 42  ALKPHOS 64 65 71 85 95  BILITOT 1.1 1.2 1.3* 1.4* 1.1  PROT 5.6* 5.7* 5.5* 5.5* 5.4*  ALBUMIN 2.6* 2.6* 2.3* 2.4* 2.3*   No results for input(s): LIPASE, AMYLASE in the last 168 hours. No results for input(s): AMMONIA in the last 168 hours. Coagulation Profile: No results for input(s): INR, PROTIME in the last 168 hours. Cardiac Enzymes: No results for input(s): CKTOTAL, CKMB, CKMBINDEX, TROPONINI in the last 168 hours. BNP (last 3 results) No results for input(s): PROBNP in the last 8760 hours. HbA1C: No results for input(s): HGBA1C in the last 72 hours. CBG: No results for input(s): GLUCAP in the last 168 hours. Lipid Profile:  No results for input(s): CHOL, HDL, LDLCALC, TRIG, CHOLHDL, LDLDIRECT in the last 72 hours. Thyroid Function Tests: No results for input(s): TSH, T4TOTAL, FREET4, T3FREE, THYROIDAB in the last 72 hours. Anemia Panel: No results for input(s): VITAMINB12, FOLATE, FERRITIN, TIBC, IRON, RETICCTPCT in the last 72 hours. Sepsis Labs: No results for input(s): PROCALCITON, LATICACIDVEN in the last 168 hours.  Recent Results (from the past 240 hour(s))  SARS Coronavirus 2 by RT PCR (hospital order, performed in [facility] hospital lab) Nasopharyngeal Nasopharyngeal Swab     Status: None   Collection Time: 10/18/19  8:53 AM   Specimen: Nasopharyngeal Swab  Result Value Ref Range Status   SARS Coronavirus 2 NEGATIVE NEGATIVE Final    Comment: (NOTE) SARS-CoV-2 target nucleic acids are NOT DETECTED. The SARS-CoV-2 RNA is generally detectable in upper and lower respiratory specimens during the acute phase of infection. The lowest concentration of SARS-CoV-2 viral copies this assay can  detect is 250 copies / mL. A negative result does not preclude SARS-CoV-2 infection and should not be used as the sole basis for treatment or other patient management decisions.  A negative result may occur with improper specimen collection / handling, submission of specimen other than nasopharyngeal swab, presence of viral mutation(s) within the areas targeted by this assay, and inadequate number of viral copies (<250 copies / mL). A negative result must be combined with clinical observations, patient history, and epidemiological information. Fact Sheet for Patients:   BoilerBrush.com.cy Fact Sheet for Healthcare Providers: https://pope.com/ This test is not yet approved or cleared  by the [place] FDA and has been authorized for detection and/or diagnosis of SARS-CoV-2 by FDA under an Emergency Use Authorization (EUA).  This EUA will remain in effect (meaning this test can be used) for the duration of the COVID-19 declaration under Section 564(b)(1) of the Act, 21 U.S.C. section 360bbb-3(b)(1), unless the authorization is terminated or revoked sooner. Performed at [facility], [address] [place], [place] [address]   Urine Culture     Status: Abnormal   Collection Time: 10/18/19 11:15 AM   Specimen: Urine, Random  Result Value Ref Range Status   Specimen Description   Final    URINE, RANDOM Performed at [facility] [address], [place], [place] [address]    Special Requests   Final    NONE Performed at [facility], [address] [place], [place] [address]    Culture (A)  Final    >=100,000 COLONIES/mL SERRATIA MARCESCENS >=100,000 COLONIES/mL ESCHERICHIA COLI    Report Status 10/22/2019 FINAL  Final   Organism ID, Bacteria SERRATIA MARCESCENS (A)  Final   Organism ID, Bacteria ESCHERICHIA COLI (A)  Final      Susceptibility   Escherichia coli - MIC*     AMPICILLIN 16 INTERMEDIATE Intermediate     CEFAZOLIN >=64 RESISTANT Resistant     CEFTRIAXONE <=1 SENSITIVE Sensitive     CIPROFLOXACIN <=0.25 SENSITIVE Sensitive     GENTAMICIN <=1 SENSITIVE Sensitive     IMIPENEM 0.5 SENSITIVE Sensitive     NITROFURANTOIN 256 RESISTANT Resistant     TRIMETH/SULFA <=20 SENSITIVE Sensitive     AMPICILLIN/SULBACTAM 8 SENSITIVE Sensitive     PIP/TAZO <=4 SENSITIVE Sensitive     * >=100,000 COLONIES/mL ESCHERICHIA COLI   Serratia marcescens - MIC*    CEFAZOLIN >=64 RESISTANT Resistant     CEFTRIAXONE <=1 SENSITIVE Sensitive     CIPROFLOXACIN <=0.25 SENSITIVE Sensitive  GENTAMICIN <=1 SENSITIVE Sensitive     NITROFURANTOIN RESISTANT Resistant     TRIMETH/SULFA <=20 SENSITIVE Sensitive     * >=100,000 COLONIES/mL SERRATIA MARCESCENS  Culture, blood (Routine X 2) w Reflex to ID Panel     Status: None   Collection Time: 10/18/19 11:16 AM   Specimen: BLOOD  Result Value Ref Range Status   Specimen Description   Final    BLOOD RIGHT ANTECUBITAL Performed at [facility], [address] [place], [place] [address]    Special Requests   Final    BOTTLES DRAWN AEROBIC AND ANAEROBIC Blood Culture adequate volume Performed at [facility], [address], [place], [place] [address]    Culture   Final    NO GROWTH 5 DAYS Performed at [facility], [address], [place], [place] [address]    Report Status 10/23/2019 FINAL  Final  Culture, blood (Routine X 2) w Reflex to ID Panel     Status: None   Collection Time: 10/18/19 11:16 AM   Specimen: BLOOD LEFT HAND  Result Value Ref Range Status   Specimen Description   Final    BLOOD LEFT HAND Performed at [facility], [address] [place], [place] [address]    Special Requests   Final    BOTTLES DRAWN AEROBIC AND ANAEROBIC Blood Culture adequate volume Performed at [facility], [address], [place], [place]  [address]    Culture   Final    NO GROWTH 5 DAYS Performed at [facility], [address] [place], [place] [address]    Report Status 10/23/2019 FINAL  Final  Surgical PCR screen     Status: Abnormal   Collection Time: 10/20/19  4:57 AM   Specimen: Nasal Mucosa; Nasal Swab  Result Value Ref Range Status   MRSA, PCR NEGATIVE NEGATIVE Final   Staphylococcus aureus POSITIVE (A) NEGATIVE Final    Comment: (NOTE) The Xpert SA Assay (FDA approved for NASAL specimens in patients 21 years of age and older), is one component of a comprehensive surveillance program. It is not intended to diagnose infection nor to guide or monitor treatment. Performed at [facility], [address], [place], [place] [address]          Radiology Studies: No results found.      Scheduled Meds: . aspirin EC  325 mg Oral Q breakfast  . cholecalciferol  2,000 Units Oral Daily  . diclofenac  1 patch Transdermal BID  . ferrous sulfate  325 mg Oral TID PC  . magnesium oxide  400 mg Oral Daily  . metoprolol succinate  12.5 mg Oral Daily  . pantoprazole  40 mg Oral Daily  . trazodone  300 mg Oral QHS  . zinc sulfate  220 mg Oral Daily   Continuous Infusions: . sodium chloride 10 mL/hr at 10/24/19 1825     LOS: 7 days    Time spent:40 min   Gelsey Amyx, Roselind Messier, MD [facility] Pager [phone]  If 7PM-7AM, please contact night-coverage [email or web address] Password [facility] 10/25/2019, 11:31 AM

## 2019-10-25 NOTE — Progress Notes (Signed)
Physical Therapy Treatment Patient Details Name: Leslie Dean MRN: [id number] DOB: [birth year] Today's Date: 10/25/2019    History of Present Illness Patient is a 74 year old female with chronic back pain secondary to scoliosis, spinal stenosis, hypertension, Marfan syndrome, urinary incontinence presented to ED 10/18/19 with fall at home resulting in left hip pain. Xray-Comminuted intertrochanteric femur fracture proximally .S/P IM nailing 10/19/19    PT Comments    POD # 7 General Comments: AxO x 4 a Retired Merchandiser, retail.  Less anxiety this session.  More cooperative and willing.  Hyper talkative and distracted to task.Assisted OOB via [employer]. General bed mobility comments: performed side to side rolling to place Maxi Move Pad under pt required + 2 Total Assist and 75% VC's for direction. Assisted out of recliner to attempt gait.   General transfer comment: from recliner assisted with sit to stand with 75% VC's to "push up" from arm rests.  Pt only put forth less 25% B UE's despite VC's.  Once upright with + 2 side by side assist "pick up" placed B elbows on EVA walker pads and 75% VC's to "stand tall" and "shift hips forward" as pt was present with MAX posterior/butt lean.  Instructed pt to "stand on both feet" and she would still be at 50% WBing.  Very limited stance time due to effort, weakness and L hip pain.  Pt was assisted to [facility] by pulling it behind her.  Same assist off BSC + 2 side by side Max Assist pt < 25% able.General Gait Details: Used EVA walker and + 2 side by side assist pt was able to take approx 3 steps.  Very short with great difficulty advancing R LE.  A lot upper body weight on walker.  Recliner brought to pt from behind. Positioned in recliner to comfort.  Performed a few TE's AP, knee presses, LAQ's and gluteal  squeezes.  Applied ICE.   Follow Up Recommendations  SNF;Supervision/Assistance - 24 hour     Equipment Recommendations       Recommendations for Other  Services       Precautions / Restrictions Precautions Precautions: Fall Precaution Comments: pre medicate for pain and also has L 4th Rib Fx Restrictions Weight Bearing Restrictions: Yes LLE Partial Weight Bearing Percentage or Pounds: 50%    Mobility  Bed Mobility Overal bed mobility: Needs Assistance Bed Mobility: Rolling Rolling: Total assist;+2 for physical assistance;+2 for safety/equipment(pt 5%)         General bed mobility comments: performed side to side rolling to place Maxi Move Pad under pt required + 2 Total Assist and 75% VC's for direction.  Transfers Overall transfer level: Needs assistance Equipment used: Bilateral platform walker(EVA walker) Transfers: Sit to/from Stand Sit to Stand: Max assist;+2 physical assistance;Total assist         General transfer comment: from recliner assisted with sit to stand with 75% VC's to "push up" from arm rests.  Pt only put forth less 25% B UE's despite VC's.  Once upright with + 2 side by side assist "pick up" placed B elbows on EVA walker pads and 75% VC's to "stand tall" and "shift hips forward" as pt was present with MAX posterior/butt lean.  Instructed pt to "stand on both feet" and she would still be at 50% WBing.  Very limited stance time due to effort, weakness and L hip pain.  Pt was assisted to [facility] by pulling it behind her.  Same assist off BSC + 2 side by  side Max Assist pt < 25% able.  Ambulation/Gait   Gait Distance (Feet): 1 Feet(3 short steps) Assistive device: Bilateral platform walker(EVA walker) Gait Pattern/deviations: Step-to pattern Gait velocity: decreased   General Gait Details: Used EVA walker and + 2 side by side assist pt was able to take approx 3 steps.  Very short with great difficulty advancing R LE.  A lot upper body weight on walker.  Recliner brought to pt from behind.   Stairs             Wheelchair Mobility    Modified Rankin (Stroke Patients Only)       Balance                                             Cognition Arousal/Alertness: Awake/alert Behavior During Therapy: WFL for tasks assessed/performed Overall Cognitive Status: Within Functional Limits for tasks assessed                                 General Comments: AxO x 4 a Retired Comptroller.  Less anxiety this session.  More cooperative and willing.  Hyper talkative and distracted to task.      Exercises  15 reps AP, 10 reps knee presses, 10 reps gluteal squeezes and 10 reps LAQ's    General Comments        Pertinent Vitals/Pain Pain Assessment: Faces Faces Pain Scale: Hurts even more Pain Location: L hip Pain Descriptors / Indicators: Aching;Discomfort;Moaning;Grimacing Pain Intervention(s): Monitored during session;Premedicated before session;Repositioned;Ice applied    Home Living                      Prior Function            PT Goals (current goals can now be found in the care plan section) Progress towards PT goals: Progressing toward goals    Frequency    Min 3X/week      PT Plan Current plan remains appropriate    Co-evaluation              AM-PAC PT "6 Clicks" Mobility   Outcome Measure  Help needed turning from your back to your side while in a flat bed without using bedrails?: Total Help needed moving from lying on your back to sitting on the side of a flat bed without using bedrails?: Total Help needed moving to and from a bed to a chair (including a wheelchair)?: Total Help needed standing up from a chair using your arms (e.g., wheelchair or bedside chair)?: Total Help needed to walk in hospital room?: Total Help needed climbing 3-5 steps with a railing? : Total 6 Click Score: 6    End of Session Equipment Utilized During Treatment: Gait belt Activity Tolerance: Patient limited by fatigue;Patient limited by pain Patient left: in chair;with call bell/phone within reach Nurse Communication: Mobility  status PT Visit Diagnosis: Unsteadiness on feet (R26.81);Difficulty in walking, not elsewhere classified (R26.2)     Time: 1022-1050 PT Time Calculation (min) (ACUTE ONLY): 28 min  Charges:  $Gait Training: 8-22 mins $Therapeutic Activity: 8-22 mins                     Felecia Shelling  PTA Acute  Rehabilitation Services Pager      [phone] Office  [phone]  

## 2019-10-25 NOTE — Care Management Important Message (Signed)
Important Message  Patient Details IM Letter given to Amada Jupiter SW Case Manager to present to the Patient Name: Leslie Dean MRN: [id number] Date of Birth: 1945/08/12   Medicare Important Message Given:  Yes     Caren Macadam 10/25/2019, 12:23 PM

## 2019-10-26 LAB — SARS CORONAVIRUS 2 (TAT 6-24 HRS): SARS Coronavirus 2: NEGATIVE

## 2019-10-26 LAB — PHOSPHORUS: Phosphorus: 4.2 mg/dL (ref 2.5–4.6)

## 2019-10-26 LAB — MAGNESIUM: Magnesium: 2.1 mg/dL (ref 1.7–2.4)

## 2019-10-26 NOTE — Progress Notes (Signed)
Inpatient Rehab Admissions Coordinator:   Received a denial for CIR authorization from [employer].  I left a message with pt's daughter, Elita Quick, and also spoke to [employer], CSW.  Will sign off at this time.   Estill Dooms, PT, DPT Admissions Coordinator [phone] 10/26/19  10:46 AM

## 2019-10-26 NOTE — Progress Notes (Signed)
Physical Therapy Treatment Patient Details Name: Leslie Dean MRN: [id number] DOB: 07-11-1945 Today's Date: 10/26/2019    History of Present Illness Patient is a 74 year old female with chronic back pain secondary to scoliosis, spinal stenosis, hypertension, Marfan syndrome, urinary incontinence presented to ED 10/18/19 with fall at home resulting in left hip pain. Xray-Comminuted intertrochanteric femur fracture proximally .S/P IM nailing 10/19/19    PT Comments    POD # 8 Assisted off BSC to attempt gait.  General transfer comment: from elevated BSC pt required + 2 side by side Total Assist to rise as pt was unable to offer no more that 25% due to "bad L shoulder", weakness and BMI.  Once upright B forearms were placed on EVA walker.  Pt still present with poor lateral posture (severe scoliosis) and posterior lean.  Third assist to perform peri care.  Brief standing ability due to L hip pain and c/o weakness. General Gait Details: Used EVA walker and + 2 side by side assist pt was able to take approx 5 steps forward..  Very short with great difficulty advancing R LE.  A lot upper body weight on walker.  Third assist for Recliner brought to pt from behind. Positioned in recliner to comfort and applied ICE.   Follow Up Recommendations  SNF;Supervision/Assistance - 24 hour     Equipment Recommendations       Recommendations for Other Services       Precautions / Restrictions Precautions Precautions: Fall Precaution Comments: pre medicate for pain and also has L 4th Rib Fx and severe scoliosis Restrictions Weight Bearing Restrictions: Yes LLE Weight Bearing: Partial weight bearing LLE Partial Weight Bearing Percentage or Pounds: 50%    Mobility  Bed Mobility     General bed mobility comments: Pt OOB on BSC  Transfers Overall transfer level: Needs assistance Equipment used: Bilateral platform walker;None Transfers: Sit to/from [employer] Transfers Sit to Stand: Max  assist;Total assist;+2 physical assistance;+2 safety/equipment;From elevated surface Stand pivot transfers: Total assist;+2 physical assistance;+2 safety/equipment;From elevated surface       General transfer comment: from elevated BSC pt required + 2 side by side Total Assist to rise as pt was unable to offer no more that 25% due to "bad L shoulder", weakness and BMI.  Once upright B forearms were placed on EVA walker.  Pt still present with poor lateral posture (severe scoliosis) and posterior lean.  Third assist to perform peri care.  Brief standing ability due to L hip pain and c/o weakness.  Ambulation/Gait   Gait Distance (Feet): 2 Feet Assistive device: Bilateral platform walker Gait Pattern/deviations: Step-to pattern Gait velocity: decreased   General Gait Details: Used EVA walker and + 2 side by side assist pt was able to take approx 5 steps forward..  Very short with great difficulty advancing R LE.  A lot upper body weight on walker.  Third assist for Recliner brought to pt from behind.   Stairs             Wheelchair Mobility    Modified Rankin (Stroke Patients Only)       Balance                                            Cognition Arousal/Alertness: Awake/alert Behavior During Therapy: WFL for tasks assessed/performed Overall Cognitive Status: Within Functional Limits for tasks assessed  General Comments: AxO x 4 a Retired Comptroller.  Less anxiety this session.  More cooperative and willing.  Hyper talkative and distracted to task. Requires redirection.      Exercises  10 AP, knee presses    General Comments        Pertinent Vitals/Pain Pain Assessment: Faces Faces Pain Scale: Hurts whole lot Pain Location: L hip Pain Descriptors / Indicators: Aching;Discomfort;Moaning;Grimacing Pain Intervention(s): Monitored during session;Premedicated before session;Repositioned;Ice applied     Home Living                      Prior Function            PT Goals (current goals can now be found in the care plan section) Progress towards PT goals: Progressing toward goals    Frequency    Min 3X/week      PT Plan Current plan remains appropriate    Co-evaluation              AM-PAC PT "6 Clicks" Mobility   Outcome Measure  Help needed turning from your back to your side while in a flat bed without using bedrails?: A Lot Help needed moving from lying on your back to sitting on the side of a flat bed without using bedrails?: A Lot Help needed moving to and from a bed to a chair (including a wheelchair)?: A Lot Help needed standing up from a chair using your arms (e.g., wheelchair or bedside chair)?: Total Help needed to walk in hospital room?: Total Help needed climbing 3-5 steps with a railing? : Total 6 Click Score: 9    End of Session Equipment Utilized During Treatment: Gait belt Activity Tolerance: Patient limited by fatigue;Patient limited by pain Patient left: in chair Nurse Communication: Mobility status PT Visit Diagnosis: Unsteadiness on feet (R26.81);Difficulty in walking, not elsewhere classified (R26.2)     Time: 7017-7939 PT Time Calculation (min) (ACUTE ONLY): 16 min  Charges:  $Gait Training: 8-22 mins $Therapeutic Activity: 23-37 mins                     Felecia Shelling  PTA Acute  Rehabilitation Services Pager      [phone] Office      [phone]

## 2019-10-26 NOTE — Plan of Care (Signed)
  Problem: Clinical Measurements: Goal: Diagnostic test results will improve Outcome: Progressing   Problem: Clinical Measurements: Goal: Respiratory complications will improve Outcome: Progressing   Problem: Clinical Measurements: Goal: Cardiovascular complication will be avoided Outcome: Progressing   Problem: Coping: Goal: Level of anxiety will decrease Outcome: Progressing   Problem: Pain Managment: Goal: General experience of comfort will improve Outcome: Progressing   Problem: Safety: Goal: Ability to remain free from injury will improve Outcome: Progressing

## 2019-10-26 NOTE — Progress Notes (Signed)
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  States was able to ambulate 3 or 4 steps today.  Understands that CIR has refused admission.  Also understands that her children are challenging ruling.   Assessment & Plan:   Principal Problem:   Closed left hip fracture (HCC) Active Problems:   HTN (hypertension)   Marfans syndrome   Acute respiratory failure (HCC)   Acute lower UTI   LEFT proximal femur fracture -S/p 5/10 open reduction, internal fixation.  See procedure below  -CIR has evaluated patient and declined admission. -5/13 patient counseled at length that she must participate with PT/OT if she is to prevent sequela of not using her new joint; i.e. scar tissue buildup causing patient not to be able to use left hip possibly leading to her being wheelchair-bound.  Daughter present -5/16 patient performing physical therapy exercises aided by her children  Pain management -Patient has been resistant to working with PT/OT -5/13 Norco 7.5-325 mg (hold) -5/13 Robaxin 500 mg (hold) -5/13 morphine discontinued -5/13 Percocet 7.5--325 mg discontinue -5/13 Tylenol PRN -5/13 tramadol 50 mg PRN  Chest/abdominal pain -Most likely secondary to malpositioning of patient; previous fracture clavicle LEFT shoulder -Repeat EKG showed PVCs.  No significant change from prior EKG in 2014  Respiratory failure with hypoxia -Most likely  iatrogenic secondary to extensive amount of narcotics. -See pain management  Hypotension on admission -5/16 normal saline KVO -Resolved  Marfan syndrome, scoliosis -See pain management   LEFT fourth fractured rib -Most likely secondary to patient's fall -See pain management -Incentive spirometry  Goals of care -5/14 Reconsult inpatient Rehab patient now more alert and interactive.  Patient should be a good candidate for CIR. -5/18 had a long discussion with patient, who now understands CIR has declined admission.  She also understands that her children are challenging this ruling.  She plans on speaking with her children tonight, and may agree to SNF or home health (do not believe she would do well) in the A.m.    DVT prophylaxis: ASA 81 mg per orthopedic surgery Code Status: Full Family Communication: 5/16 daughter and son at bedside discussed plan of care answered all questions Disposition Plan: 5/15 consulted CIR; awaiting answer   Status is: Inpatient  Dispo: The patient is from: Home              Anticipated d/c is to: CIR              Anticipated d/c date is:??              Patient currently unstable      Consultants:  Orthopedic surgery Dr. Paralee Cancel :  Procedures/Significant Events:  5/10 Open reduction internal fixation of left proximal femur fracture utilizing a [employer] Aff[employer]us long nail, 11 mm x 380 mm with a 115 mm lag screw and a distal interlock. 5/13 CXR;-findings suggestive of left fourth rib fracture with some  associated pleural thickening. No pneumothorax    I have personally reviewed and interpreted all radiology studies and my findings are as above.  VENTILATOR SETTINGS:    Cultures   Antimicrobials:    Devices    LINES / TUBES:      Continuous Infusions: . sodium chloride 10 mL/hr at 10/24/19 1825     Objective: Vitals:   10/25/19 1354 10/25/19 2100 10/26/19 0612 10/26/19 1407  BP: (!) 119/57 108/60 115/71  (!) 119/53  Pulse: 70 73 67 67  Resp: 18 17 15 17   Temp: 99.4 F (37.4 C) 99 F (37.2 C) 98.7 F (37.1 C) 98.3 F (36.8 C)  TempSrc: Oral Oral    SpO2: 92% 94% 95% (!) 89%  Weight:      Height:        Intake/Output Summary (Last 24 hours) at 10/26/2019 1437 Last data filed at 10/26/2019 [id number] Gross per 24 hour  Intake 1080 ml  Output 1050 ml  Net 30 ml   Filed Weights   10/18/19 1700  Weight: 88.9 kg   Physical Exam:  General: A/O x4, no acute respiratory distress Eyes: negative scleral hemorrhage, negative anisocoria, negative icterus ENT: Negative Runny nose, negative gingival bleeding, Neck:  Negative scars, masses, torticollis, lymphadenopathy, JVD Lungs: Clear to auscultation bilaterally without wheezes or crackles Cardiovascular: Regular rate and rhythm without murmur gallop or rub normal S1 and S2 Abdomen: OBESE, negative abdominal pain, nondistended, positive soft, bowel sounds, no rebound, no ascites, no appreciable mass Extremities: No significant cyanosis, clubbing.  LEFT thigh/hip swollen, negative warmth to touch,  Skin: Negative rashes, lesions, ulcers Psychiatric:  Negative depression, negative anxiety, negative fatigue, negative mania  Central nervous system:  Cranial nerves II through XII intact, tongue/uvula midline, all extremities muscle strength 5/5, sensation intact throughout, negative dysarthria, negative expressive aphasia, negative receptive aphasia..      Data Reviewed: Care during the described time interval was provided by me .  I have reviewed this patient's available data, including medical history, events of note, physical examination, and all test results as part of my evaluation.  CBC: Recent Labs  Lab 10/21/19 0554 10/22/19 0439 10/23/19 0418 10/24/19 0413 10/25/19 0315  WBC 12.5* 13.1* 11.5* 11.6* 12.6*  NEUTROABS 9.6* 9.8* 8.2* 7.9* 9.4*  HGB 10.1* 10.4* 10.2* 10.2* 10.2*  HCT 32.7* 33.3* 32.1* 32.5* 32.9*  MCV 93.7 93.0 93.3  93.4 94.8  PLT 142* 154 176 204 234   Basic Metabolic Panel: Recent Labs  Lab 10/21/19 0554 10/21/19 0554 10/22/19 0439 10/23/19 0418 10/24/19 0413 10/25/19 0315 10/26/19 0329  NA 140  --  140 138 142 139  --   K 4.3  --  4.3 3.9 3.8 3.9  --   CL 106  --  107 105 106 104  --   CO2 24  --  27 27 29 26   --   GLUCOSE 119*  --  119* 115* 111* 113*  --   BUN 12  --  15 15 16 13   --   CREATININE 0.58  --  0.60 0.59 0.69 0.60  --   CALCIUM 9.0  --  9.0 8.9 9.1 8.8*  --   MG 2.2   < > 2.1 2.1 2.2 2.1 2.1  PHOS 2.4*   < > 2.5 3.1 3.6 3.5 4.2   < > = values in this interval not displayed.   GFR: Estimated Creatinine Clearance: 71.7 mL/min (by C-G formula based on SCr of 0.6 mg/dL). Liver Function Tests:  Recent Labs  Lab 10/21/19 0554 10/22/19 0439 10/23/19 0418 10/24/19 0413 10/25/19 0315  AST 18 15 24 28  32  ALT 13 15 25  35 42  ALKPHOS 64 65 71 85 95  BILITOT 1.1 1.2 1.3* 1.4* 1.1  PROT 5.6* 5.7* 5.5* 5.5* 5.4*  ALBUMIN 2.6* 2.6* 2.3* 2.4* 2.3*   No results for input(s): LIPASE, AMYLASE in the last 168 hours. No results for input(s): AMMONIA in the last 168 hours. Coagulation Profile: No results for input(s): INR, PROTIME in the last 168 hours. Cardiac Enzymes: No results for input(s): CKTOTAL, CKMB, CKMBINDEX, TROPONINI in the last 168 hours. BNP (last 3 results) No results for input(s): PROBNP in the last 8760 hours. HbA1C: No results for input(s): HGBA1C in the last 72 hours. CBG: No results for input(s): GLUCAP in the last 168 hours. Lipid Profile: No results for input(s): CHOL, HDL, LDLCALC, TRIG, CHOLHDL, LDLDIRECT in the last 72 hours. Thyroid Function Tests: No results for input(s): TSH, T4TOTAL, FREET4, T3FREE, THYROIDAB in the last 72 hours. Anemia Panel: No results for input(s): VITAMINB12, FOLATE, FERRITIN, TIBC, IRON, RETICCTPCT in the last 72 hours. Sepsis Labs: No results for input(s): PROCALCITON, LATICACIDVEN in the last 168 hours.  Recent  Results (from the past 240 hour(s))  SARS Coronavirus 2 by RT PCR (hospital order, performed in [facility] hospital lab) Nasopharyngeal Nasopharyngeal Swab     Status: None   Collection Time: 10/18/19  8:53 AM   Specimen: Nasopharyngeal Swab  Result Value Ref Range Status   SARS Coronavirus 2 NEGATIVE NEGATIVE Final    Comment: (NOTE) SARS-CoV-2 target nucleic acids are NOT DETECTED. The SARS-CoV-2 RNA is generally detectable in upper and lower respiratory specimens during the acute phase of infection. The lowest concentration of SARS-CoV-2 viral copies this assay can detect is 250 copies / mL. A negative result does not preclude SARS-CoV-2 infection and should not be used as the sole basis for treatment or other patient management decisions.  A negative result may occur with improper specimen collection / handling, submission of specimen other than nasopharyngeal swab, presence of viral mutation(s) within the areas targeted by this assay, and inadequate number of viral copies (<250 copies / mL). A negative result must be combined with clinical observations, patient history, and epidemiological information. Fact Sheet for Patients:   [facility] Fact Sheet for Healthcare Providers: 12/18/19 This test is not yet approved or cleared  by the [employer] FDA and has been authorized for detection and/or diagnosis of SARS-CoV-2 by FDA under an Emergency Use Authorization (EUA).  This EUA will remain in effect (meaning this test can be used) for the duration of the COVID-19 declaration under Section 564(b)(1) of the Act, 21 U.S.C. section 360bbb-3(b)(1), unless the authorization is terminated or revoked sooner. Performed at [facility], [address] [place], [place]   Urine Culture     Status: Abnormal   Collection Time: 10/18/19 11:15 AM   Specimen: Urine, Random  Result Value Ref Range  Status   Specimen Description   Final    URINE, RANDOM Performed at [facility], [address], [place], [place]    Special Requests   Final    NONE Performed at [facility], [address], [place], [place]    Culture (A)  Final    >=100,000 COLONIES/mL SERRATIA MARCESCENS >=100,000 COLONIES/mL ESCHERICHIA COLI    Report Status 10/22/2019 FINAL  Final   Organism ID, Bacteria SERRATIA MARCESCENS (  A)  Final   Organism ID, Bacteria ESCHERICHIA COLI (A)  Final      Susceptibility   Escherichia coli - MIC*    AMPICILLIN 16 INTERMEDIATE Intermediate     CEFAZOLIN >=64 RESISTANT Resistant     CEFTRIAXONE <=1 SENSITIVE Sensitive     CIPROFLOXACIN <=0.25 SENSITIVE Sensitive     GENTAMICIN <=1 SENSITIVE Sensitive     IMIPENEM 0.5 SENSITIVE Sensitive     NITROFURANTOIN 256 RESISTANT Resistant     TRIMETH/SULFA <=20 SENSITIVE Sensitive     AMPICILLIN/SULBACTAM 8 SENSITIVE Sensitive     PIP/TAZO <=4 SENSITIVE Sensitive     * >=100,000 COLONIES/mL ESCHERICHIA COLI   Serratia marcescens - MIC*    CEFAZOLIN >=64 RESISTANT Resistant     CEFTRIAXONE <=1 SENSITIVE Sensitive     CIPROFLOXACIN <=0.25 SENSITIVE Sensitive     GENTAMICIN <=1 SENSITIVE Sensitive     NITROFURANTOIN RESISTANT Resistant     TRIMETH/SULFA <=20 SENSITIVE Sensitive     * >=100,000 COLONIES/mL SERRATIA MARCESCENS  Culture, blood (Routine X 2) w Reflex to ID Panel     Status: None   Collection Time: 10/18/19 11:16 AM   Specimen: BLOOD  Result Value Ref Range Status   Specimen Description   Final    BLOOD RIGHT ANTECUBITAL Performed at [facility], [address], [place], [place] [address]    Special Requests   Final    BOTTLES DRAWN AEROBIC AND ANAEROBIC Blood Culture adequate volume Performed at [facility], [address], [place], [place] [address]    Culture   Final    NO GROWTH 5 DAYS Performed at [facility], [address] [place], [place] [address]    Report Status 10/23/2019 FINAL  Final  Culture, blood (Routine X 2) w Reflex to ID Panel     Status: None   Collection Time: 10/18/19 11:16 AM   Specimen: BLOOD LEFT HAND  Result Value Ref Range Status   Specimen Description   Final    BLOOD LEFT HAND Performed at [facility], [address] , [place] [address]    Special Requests   Final    BOTTLES DRAWN AEROBIC AND ANAEROBIC Blood Culture adequate volume Performed at [facility], [address] [place], [place] [address]    Culture   Final    NO GROWTH 5 DAYS Performed at [facility], [address], [place], [place] [address]    Report Status 10/23/2019 FINAL  Final  Surgical PCR screen     Status: Abnormal   Collection Time: 10/20/19  4:57 AM   Specimen: Nasal Mucosa; Nasal Swab  Result Value Ref Range Status   MRSA, PCR NEGATIVE NEGATIVE Final   Staphylococcus aureus POSITIVE (A) NEGATIVE Final    Comment: (NOTE) The Xpert SA Assay (FDA approved for NASAL specimens in patients 33 years of age and older), is one component of a comprehensive surveillance program. It is not intended to diagnose infection nor to guide or monitor treatment. Performed at [facility], [address] [place], [place] [address]   SARS CORONAVIRUS 2 (TAT 6-24 HRS) Nasopharyngeal Nasopharyngeal Swab     Status: None   Collection Time: 10/26/19  1:30 AM   Specimen: Nasopharyngeal Swab  Result Value Ref Range Status   SARS Coronavirus 2 NEGATIVE NEGATIVE Final    Comment: (NOTE) SARS-CoV-2 target nucleic acids are NOT DETECTED. The SARS-CoV-2 RNA is generally detectable in  upper and lower respiratory specimens during the acute phase of infection. Negative results do not preclude SARS-CoV-2 infection, do not rule out co-infections with other pathogens, and should not be used as the sole basis for treatment or other  patient management decisions. Negative results must be combined with clinical observations, patient history, and epidemiological information. The expected result is Negative. Fact Sheet for Patients: [employer] Fact Sheet for Healthcare Providers: quierodirigir.com This test is not yet approved or cleared by the [place] FDA and  has been authorized for detection and/or diagnosis of SARS-CoV-2 by FDA under an Emergency Use Authorization (EUA). This EUA will remain  in effect (meaning this test can be used) for the duration of the COVID-19 declaration under Section 56 4(b)(1) of the Act, 21 U.S.C. section 360bbb-3(b)(1), unless the authorization is terminated or revoked sooner. Performed at [facility], [address] [place], [place] [address]          Radiology Studies: No results found.      Scheduled Meds: . aspirin EC  325 mg Oral Q breakfast  . cholecalciferol  2,000 Units Oral Daily  . diclofenac  1 patch Transdermal BID  . ferrous sulfate  325 mg Oral TID PC  . magnesium oxide  400 mg Oral Daily  . metoprolol succinate  12.5 mg Oral Daily  . pantoprazole  40 mg Oral Daily  . trazodone  300 mg Oral QHS  . zinc sulfate  220 mg Oral Daily   Continuous Infusions: . sodium chloride 10 mL/hr at 10/24/19 1825     LOS: 8 days    Time spent:40 min   Parul Porcelli, Roselind Messier, MD [facility] Pager [phone]  If 7PM-7AM, please contact night-coverage [email or web address] Password [email or web address] 10/26/2019, 2:37 PM

## 2019-10-26 NOTE — Plan of Care (Signed)

## 2019-10-26 NOTE — TOC Progression Note (Signed)
Transition of Care [facility]) - Progression Note    Patient Details  Name: Leslie Dean MRN: [id number] Date of Birth: 1945-07-28  Transition of Care [facility]) CM/SW Contact  Lennart Pall,  Phone Number: 10/26/2019, 12:35 PM  Clinical Narrative: Met with pt, daughter and son (speaker phone) to discuss insurance denial of CIR and need to proceed with SNF.  Pt and family very upset with denial and wish to pursue a pt/family appeal.  They have received the denial letter which includes appeal instructions.  Will continue to follow.      Expected Discharge Plan: Skilled Nursing Facility Barriers to Discharge: SNF Pending bed offer  Expected Discharge Plan and Services Expected Discharge Plan: [place]   Discharge Planning Services: CM Consult Post Acute Care Choice: Durable Medical Equipment, Home Health Living arrangements for the past 2 months: Single Family Home                                       Social Determinants of Health (SDOH) Interventions    Readmission Risk Interventions No flowsheet data found.

## 2019-10-26 NOTE — Progress Notes (Signed)
Physical Therapy Treatment Patient Details Name: Leslie Dean MRN: [id number] DOB: [birth year] Today's Date: 10/26/2019    History of Present Illness Patient is a 74 year old female with chronic back pain secondary to scoliosis, spinal stenosis, hypertension, Marfan syndrome, urinary incontinence presented to ED 10/18/19 with fall at home resulting in left hip pain. Xray-Comminuted intertrochanteric femur fracture proximally .S/P IM nailing 10/19/19    PT Comments    General Comments: AxO x 4 a Retired Comptroller.  Less anxiety this session.  More cooperative and willing.  Hyper talkative and distracted to task. Requires redirection.Assisted OOB to [facility].  General bed mobility comments: had pt use her gait belt to self assist L LE off bed.  With increased time x 4 and 75% VC's on proper tech and sequencing, pt was able to transition to EOB.  Still required Mod Assist for upper body due to ABD girth and rwequired Max Assist to complete scooting to EOB due to BMI/weakness/bad L shoulder. General transfer comment: from elevated bed to [facility] + 2 assist with 75% VC's on proper hand transfer pt had difficulty maintaining PWB and required increased assist to complete 1/4 pivot turn. Allowed pt to stay on [facility] per pt request with call light near and daughter in room.  Follow Up Recommendations  SNF;Supervision/Assistance - 24 hour     Equipment Recommendations       Recommendations for Other Services       Precautions / Restrictions Precautions Precautions: Fall Precaution Comments: pre medicate for pain and also has L 4th Rib Fx and severe scoliosis Restrictions Weight Bearing Restrictions: Yes LLE Weight Bearing: Partial weight bearing LLE Partial Weight Bearing Percentage or Pounds: 50%    Mobility  Bed Mobility Overal bed mobility: Needs Assistance       Supine to sit: Mod assist;+2 for safety/equipment     General bed mobility comments: had pt use her gait belt to self assist L LE  off bed.  With increased time x 4 and 75% VC's on proper tech and sequencing, pt was able to transition to EOB.  Still required Mod Assist for upper body due to ABD girth and rwequired Max Assist to complete scooting to EOB due to BMI/weakness/bad L shoulder.  Transfers Overall transfer level: Needs assistance Equipment used: Bilateral platform walker;None Transfers: Sit to/from [employer] Transfers Sit to Stand: Max assist;Total assist;+2 physical assistance;+2 safety/equipment;From elevated surface Stand pivot transfers: Total assist;+2 physical assistance;+2 safety/equipment;From elevated surface       General transfer comment: from elevated bed to [facility] + 2 assist with 75% VC's on proper hand transfer pt had difficulty maintaining PWB and required increased assist to complete 1/4 pivot turn.  Ambulation/Gait                 Stairs             Wheelchair Mobility    Modified Rankin (Stroke Patients Only)       Balance                                            Cognition Arousal/Alertness: Awake/alert Behavior During Therapy: WFL for tasks assessed/performed Overall Cognitive Status: Within Functional Limits for tasks assessed  General Comments: AxO x 4 a Retired Merchandiser, retail.  Less anxiety this session.  More cooperative and willing.  Hyper talkative and distracted to task. Requires redirection.      Exercises      General Comments        Pertinent Vitals/Pain Pain Assessment: Faces Faces Pain Scale: Hurts whole lot Pain Location: L hip Pain Descriptors / Indicators: Aching;Discomfort;Moaning;Grimacing Pain Intervention(s): Monitored during session;Premedicated before session;Repositioned;Ice applied    Home Living                      Prior Function            PT Goals (current goals can now be found in the care plan section) Progress towards PT goals: Progressing  toward goals    Frequency    Min 3X/week      PT Plan Current plan remains appropriate    Co-evaluation              AM-PAC PT "6 Clicks" Mobility   Outcome Measure  Help needed turning from your back to your side while in a flat bed without using bedrails?: A Lot Help needed moving from lying on your back to sitting on the side of a flat bed without using bedrails?: A Lot Help needed moving to and from a bed to a chair (including a wheelchair)?: A Lot Help needed standing up from a chair using your arms (e.g., wheelchair or bedside chair)?: Total Help needed to walk in hospital room?: Total Help needed climbing 3-5 steps with a railing? : Total 6 Click Score: 9    End of Session Equipment Utilized During Treatment: Gait belt Activity Tolerance: Patient limited by fatigue;Patient limited by pain Patient left: Other (comment)(BSC with daughter in room) Nurse Communication: Mobility status PT Visit Diagnosis: Unsteadiness on feet (R26.81);Difficulty in walking, not elsewhere classified (R26.2)     Time: 2376-2831 PT Time Calculation (min) (ACUTE ONLY): 30 min  Charges:  $Therapeutic Activity: 23-37 mins                     Rica Koyanagi  PTA Acute  Rehabilitation Services Pager      [phone] Office      [phone]

## 2019-10-27 ENCOUNTER — Inpatient Hospital Stay (HOSPITAL_COMMUNITY): Payer: PPO

## 2019-10-27 LAB — BASIC METABOLIC PANEL
Anion gap: 9 (ref 5–15)
BUN: 14 mg/dL (ref 8–23)
CO2: 29 mmol/L (ref 22–32)
Calcium: 9.2 mg/dL (ref 8.9–10.3)
Chloride: 99 mmol/L (ref 98–111)
Creatinine, Ser: 0.71 mg/dL (ref 0.44–1.00)
GFR calc Af Amer: >60 mL/min (ref >60)
GFR calc non Af Amer: >60 mL/min (ref >60)
Glucose, Bld: 113 mg/dL — ABNORMAL HIGH (ref 70–99)
Potassium: 4.4 mmol/L (ref 3.5–5.1)
Sodium: 137 mmol/L (ref 135–145)

## 2019-10-27 LAB — CBC
HCT: 31 % — ABNORMAL LOW (ref 36.0–46.0)
Hemoglobin: 9.6 g/dL — ABNORMAL LOW (ref 12.0–15.0)
MCH: 29.3 pg (ref 26.0–34.0)
MCHC: 31 g/dL (ref 30.0–36.0)
MCV: 94.5 fL (ref 80.0–100.0)
Platelets: 333 10*3/uL (ref 150–400)
RBC: 3.28 MIL/uL — ABNORMAL LOW (ref 3.87–5.11)
RDW: 14.6 % (ref 11.5–15.5)
WBC: 15 10*3/uL — ABNORMAL HIGH (ref 4.0–10.5)
nRBC: 0 % (ref 0.0–0.2)

## 2019-10-27 LAB — PHOSPHORUS: Phosphorus: 4.1 mg/dL (ref 2.5–4.6)

## 2019-10-27 LAB — MAGNESIUM: Magnesium: 2.1 mg/dL (ref 1.7–2.4)

## 2019-10-27 MED ORDER — SODIUM CHLORIDE 0.9 % IV SOLN
1.0000 g | INTRAVENOUS | Status: DC
  Administered 2019-10-27 – 2019-10-28 (×2): 1 g via INTRAVENOUS
  Filled 2019-10-27 (×2): qty 1

## 2019-10-27 NOTE — Progress Notes (Signed)
Physical Therapy Treatment Patient Details Name: Leslie Dean MRN: [id number] DOB: [birth year] Today's Date: 10/27/2019    History of Present Illness Patient is a 74 year old female with chronic back pain secondary to scoliosis, spinal stenosis, hypertension, Marfan syndrome, urinary incontinence presented to ED 10/18/19 with fall at home resulting in left hip pain. Xray-Comminuted intertrochanteric femur fracture proximally .S/P IM nailing 10/19/19    PT Comments    POD # 9 Assisted off BSC required + 2 side by side assist with 50% VC's to "push up" on rocking forward momentum count 3.  Assisted with B UE's on EVA walker platform pads.  Assisted with peri care care as pt stood briefly.  Poor posture.  Lateral scoliosis. General Gait Details: Used EVA walker and + 2 side by side assist pt was able to increase her gait distance from 2 feet to 4..  Very short with great difficulty advancing R LE.  A lot upper body weight on walker.  Third assist for Recliner brought to pt from behind. Positioned to comfort then performed a few TE's.    Follow Up Recommendations  SNF;Supervision/Assistance - 24 hour     Equipment Recommendations       Recommendations for Other Services       Precautions / Restrictions Precautions Precautions: Fall Precaution Comments: pre medicate for pain and also has L 4th Rib Fx and severe scoliosis Restrictions Weight Bearing Restrictions: Yes LLE Weight Bearing: Partial weight bearing LLE Partial Weight Bearing Percentage or Pounds: 50%    Mobility  Bed Mobility Overal bed mobility: Needs Assistance       Supine to sit: Mod assist;+2 for safety/equipment     General bed mobility comments: OOB on BSC  Transfers Overall transfer level: Needs assistance Equipment used: Bilateral platform walker;None Transfers: Sit to/from [employer] Sit to Stand: Max assist;Total assist;+2 physical assistance;+2 safety/equipment;From elevated  surface Stand pivot transfers: Total assist;+2 physical assistance;+2 safety/equipment;From elevated surface       General transfer comment: from elevated BSC pt required + 2 side by side Total Assist to rise as pt was unable to offer no more that 25% due to "bad L shoulder", weakness and BMI.  Once upright B forearms were placed on EVA walker.  Pt still present with poor lateral posture (severe scoliosis) and posterior lean.  Third assist to perform peri care.  Brief standing ability due to L hip pain and c/o weakness.  Ambulation/Gait Ambulation/Gait assistance: Mod assist;+2 physical assistance Gait Distance (Feet): 4 Feet Assistive device: Bilateral platform walker Gait Pattern/deviations: Step-to pattern;Decreased stance time - left Gait velocity: decreased   General Gait Details: Used EVA walker and + 2 side by side assist pt was able to increase her gait distance from 2 feet to 4..  Very short with great difficulty advancing R LE.  A lot upper body weight on walker.  Third assist for Recliner brought to pt from behind.   Stairs             Wheelchair Mobility    Modified Rankin (Stroke Patients Only)       Balance                                            Cognition Arousal/Alertness: Awake/alert Behavior During Therapy: WFL for tasks assessed/performed Overall Cognitive Status: Within Functional Limits for tasks assessed  General Comments: AxO x 4 a Retired Comptroller.  Less anxiety this session.  More cooperative and willing each session.  Less anxiety .  Hyper talkative and distracted to task. Requires redirection.      Exercises      General Comments        Pertinent Vitals/Pain Pain Assessment: 0-10 Pain Score: 7  Faces Pain Scale: Hurts whole lot Pain Location: L hip Pain Descriptors / Indicators: Aching;Discomfort;Moaning;Grimacing Pain Intervention(s): Monitored during  session;Premedicated before session;Repositioned;Ice applied    Home Living                      Prior Function            PT Goals (current goals can now be found in the care plan section) Progress towards PT goals: Progressing toward goals    Frequency    Min 3X/week      PT Plan Current plan remains appropriate    Co-evaluation              AM-PAC PT "6 Clicks" Mobility   Outcome Measure  Help needed turning from your back to your side while in a flat bed without using bedrails?: A Lot Help needed moving from lying on your back to sitting on the side of a flat bed without using bedrails?: A Lot Help needed moving to and from a bed to a chair (including a wheelchair)?: A Lot Help needed standing up from a chair using your arms (e.g., wheelchair or bedside chair)?: Total Help needed to walk in hospital room?: Total Help needed climbing 3-5 steps with a railing? : Total 6 Click Score: 9    End of Session Equipment Utilized During Treatment: Gait belt Activity Tolerance: Patient limited by fatigue;Patient limited by pain Patient left: in chair Nurse Communication: Mobility status PT Visit Diagnosis: Unsteadiness on feet (R26.81);Difficulty in walking, not elsewhere classified (R26.2)     Time: 9528-4132 PT Time Calculation (min) (ACUTE ONLY): 27 min  Charges:  $Gait Training: 8-22 mins $Therapeutic Activity: 8-22 mins                     Felecia Shelling  PTA Acute  Rehabilitation Services Pager      [phone] Office      [phone]

## 2019-10-27 NOTE — TOC Progression Note (Addendum)
Transition of Care [facility]) - Progression Note    Patient Details  Name: Leslie Dean MRN: [id number] Date of Birth: January 31, 1946  Transition of Care [facility]) CM/SW Contact  Amada Jupiter, LCSW Phone Number: 10/27/2019, 9:19 AM  Clinical Narrative:   Spoke with pt and son, Jillyn Hidden, yesterday and this morning regarding d/c plans.  They are both aware that PT is still recommending SNF level of care and they are in agreement with this recommendation.  Son does not plan to appeal the CIR denial and both in agreement with this SW continuing to pursue SNF placement.  Have alerted HTA/THN CM and she will restart pre-auth for SNF.    1:50 pm addendum:  Pt has accepted SNF bed offer at [facility] and have received insurance auth.  Per MD, need to check swelling in LE and will tentatively plan transfer to facility tomorrow.  Expected Discharge Plan: Skilled Nursing Facility Barriers to Discharge: SNF Pending bed offer  Expected Discharge Plan and Services Expected Discharge Plan: Skilled Nursing Facility   Discharge Planning Services: CM Consult Post Acute Care Choice: Durable Medical Equipment, Home Health Living arrangements for the past 2 months: Single Family Home                                       Social Determinants of Health (SDOH) Interventions    Readmission Risk Interventions No flowsheet data found.

## 2019-10-27 NOTE — Progress Notes (Signed)
     Subjective: 9 Days Post-Op Procedure(s) (LRB): INTRAMEDULLARY (IM) NAIL FEMORAL (Left)   Patient reports pain as mild at rest, but increases slightly with movement of the left leg.  No reported events overnight.  Some confusion on discharge disposition which she talked about at length.     Objective:   VITALS:   Vitals:   10/26/19 2004 10/27/19 0604  BP: (!) 113/36 (!) 103/55  Pulse: 76 64  Resp: 18 16  Temp: 98.3 F (36.8 C) 98.5 F (36.9 C)  SpO2: 93% 90%    Dorsiflexion/Plantar flexion intact Incision: scant drainage No cellulitis present Compartment soft  LABS Recent Labs    10/25/19 0315 10/27/19 0548  HGB 10.2* 9.6*  HCT 32.9* 31.0*  WBC 12.6* 15.0*  PLT 234 333    Recent Labs    10/25/19 0315 10/27/19 0548  NA 139 137  K 3.9 4.4  BUN 13 14  CREATININE 0.60 0.71  GLUCOSE 113* 113*     Assessment/Plan: 9 Days Post-Op Procedure(s) (LRB): INTRAMEDULLARY (IM) NAIL FEMORAL (Left) Nurse to change the dressings to Aquacel dressing today Up with therapy Discharge disposition TBD Discussed that if she is unable to care for self at home she may need a SNF, discussed CIR denying admission there.     Ortho recommendations:  ASA 81 mg bid for 4 weeks for anticoagulation, unless other medically indicated.  Norco for pain management (Rx written).  MiraLax and Colace for constipation  Iron 325 mg tid for 2-3 weeks   PWB 50% on the left leg.  Dressing to remain in place until follow in clinic in 2 weeks.  Dressing is waterproof and may shower with it in place.  Follow up in 2 weeks at [facility]. Follow up with OLIN,Kamoni Gentles D in 2 weeks.  Contact information:  [facility] [address], [address] [place] [address] [phone]       Lanney Gins PA-C  [facility]  [place] [address], [address], [place], [place] [address] Phone:  [phone] [email or web address] Facebook  [employer]

## 2019-10-27 NOTE — Progress Notes (Signed)
PROGRESS NOTE    Leslie Dean  HUD:[id number] DOB: 1945/10/05 DOA: 10/18/2019 PCP: Patient, No Pcp Per   Chief Complaint  Patient presents with  . Fall  . 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[employer]) PMHx Marfan syndrome,Chronic back pain with some scoliosis and on multiple meds previously not on any now,HTN Prior admission large hepatic cyst 12/06/2012-(was benign)   Accidental fall-immediate left hip pain 10/10-Rx NAD 200 mcg fentanyl 1 mg Dilaudid 4 mg morphine = hypoxia in ED  EKG on admit RBBB LPF B Labs pretty normal other than white count 19 on admission  Assessment & Plan:   Principal Problem:   Closed left hip fracture (HCC) Active Problems:   HTN (hypertension)   Marfans syndrome   Acute respiratory failure (HCC)   Acute lower UTI  LEFT proximal femur fracture -S/p 5/10 open reduction, internal fixation. See procedure below  -CIR has evaluated patient and declined admission. -5/13 patient counseled at length that she must participate with PT/OT if she is to prevent sequela of not using her new joint; i.e. scar tissue buildup causing patient not to be able to use left hip possibly leading to her being wheelchair-bound. Daughter present -5/16 patient performing physical therapy exercises aided by her children - ASA 81 mg BID for dvt ppx - she prefers to not use any opiates - PWB 50% on the L leg - Left shin with some swelling, looks like hematoma, will follow tib/fib imaging  Pain management -Patient has been resistant to working with PT/OT -5/13 Norco 7.5-325 mg(hold) -5/13 Robaxin 500 mg (hold) -5/13 morphine discontinued -5/13 Percocet 7.5--325 mg discontinue -5/13 TylenolPRN -5/13 tramadol 50 mgPRN  Chest/abdominal pain -Most likely secondary to malpositioning of patient; previous fracture clavicle LEFT shoulder -Repeat EKG showed PVCs. No significant change from prior EKG in 2014  Respiratory failure with  hypoxia -Most likely iatrogenic secondary to extensive amount of narcotics. -See pain management  Hypotension on admission -5/16 normal saline KVO -Resolved  Marfan syndrome, scoliosis -See pain management   LEFT fourth fractured rib -Most likely secondary to patient's fall -See pain management -Incentive spirometry  Leukocytosis  Serratia Marcescens and E. Coli UTI: No clear sx, but with elevated WBC, will treat  Goals of care -5/14 Reconsult inpatient Rehab patient now more alert and interactive.  Patient should be Billyjack Trompeter good candidate for CIR. -5/18 had Jayquon Theiler long discussion with patient, who now understands CIR has declined admission.  She also understands that her children are challenging this ruling.  She plans on speaking with her children tonight, and may agree to SNF or home health (do not believe she would do well) in the [place].m. -5/19 plan for SNF in AM  DVT prophylaxis: aspirin  Code Status: full Family Communication: son, daughter Disposition:   Status is: Inpatient  Remains inpatient appropriate because:Inpatient level of care appropriate due to severity of illness   Dispo: The patient is from: SNF              Anticipated d/c is to: SNF              Anticipated d/c date is: 1 day              Patient currently is not medically stable to d/c.   Consultants:   orthopedics  Procedures:  5/11  Left proximal femur, reverse obliquity intertrochanteric femur fracture.   Antimicrobials:  Anti-infectives (From admission, onward)   Start  Dose/Rate Route Frequency Ordered Stop   10/27/19 1300  cefTRIAXone (ROCEPHIN) 1 g in sodium chloride 0.9 % 100 mL IVPB     1 g 200 mL/hr over 30 Minutes Intravenous Every 24 hours 10/27/19 1229     10/19/19 1000  cefTRIAXone (ROCEPHIN) 1 g in sodium chloride 0.9 % 100 mL IVPB  Status:  Discontinued     1 g 200 mL/hr over 30 Minutes Intravenous Every 24 hours 10/18/19 1507 10/19/19 1007   10/19/19 0600  ceFAZolin (ANCEF)  IVPB 2g/100 mL premix  Status:  Discontinued     2 g 200 mL/hr over 30 Minutes Intravenous On call to O.R. 10/18/19 1603 10/18/19 1817   10/19/19 0400  ceFAZolin (ANCEF) IVPB 2g/100 mL premix     2 g 200 mL/hr over 30 Minutes Intravenous Every 6 hours 10/18/19 2324 10/19/19 1043   10/18/19 2047  ceFAZolin (ANCEF) 2-4 GM/100ML-% IVPB    Note to Pharmacy: Armandina GemmaMirarchi, Angela   : cabinet override      10/18/19 2047 10/19/19 0859   10/18/19 0945  cefTRIAXone (ROCEPHIN) 1 g in sodium chloride 0.9 % 100 mL IVPB     1 g 200 mL/hr over 30 Minutes Intravenous  Once 10/18/19 0925 10/18/19 1111     Subjective: C/o Lle pain  Objective: Vitals:   10/26/19 0612 10/26/19 1407 10/26/19 2004 10/27/19 0604  BP: 115/71 (!) 119/53 (!) 113/36 (!) 103/55  Pulse: 67 67 76 64  Resp: 15 17 18 16   Temp: 98.7 F (37.1 C) 98.3 F (36.8 C) 98.3 F (36.8 C) 98.5 F (36.9 C)  TempSrc:      SpO2: 95% (!) 89% 93% 90%  Weight:      Height:        Intake/Output Summary (Last 24 hours) at 10/27/2019 1508 Last data filed at 10/27/2019 1055 Gross per 24 hour  Intake 840 ml  Output 1100 ml  Net -260 ml   Filed Weights   10/18/19 1700  Weight: 88.9 kg    Examination:  General exam: Appears calm and comfortable  Respiratory system: Clear to auscultation. Respiratory effort normal. Cardiovascular system: S1 & S2 heard, RRR.  Gastrointestinal system: Abdomen is nondistended, soft and nontender.  Central nervous system: Alert and oriented. No focal neurological deficits. Extremities: LLE with dressing intact, ?hematoma over shin, ttp Skin: No rashes, lesions or ulcers Psychiatry: Judgement and insight appear normal. Mood & affect appropriate.     Data Reviewed: I have personally reviewed following labs and imaging studies  CBC: Recent Labs  Lab 10/21/19 0554 10/21/19 0554 10/22/19 0439 10/23/19 0418 10/24/19 0413 10/25/19 0315 10/27/19 0548  WBC 12.5*   < > 13.1* 11.5* 11.6* 12.6* 15.0*    NEUTROABS 9.6*  --  9.8* 8.2* 7.9* 9.4*  --   HGB 10.1*   < > 10.4* 10.2* 10.2* 10.2* 9.6*  HCT 32.7*   < > 33.3* 32.1* 32.5* 32.9* 31.0*  MCV 93.7   < > 93.0 93.3 93.4 94.8 94.5  PLT 142*   < > 154 176 204 234 333   < > = values in this interval not displayed.    Basic Metabolic Panel: Recent Labs  Lab 10/22/19 0439 10/22/19 0439 10/23/19 0418 10/24/19 0413 10/25/19 0315 10/26/19 0329 10/27/19 0548  NA 140  --  138 142 139  --  137  K 4.3  --  3.9 3.8 3.9  --  4.4  CL 107  --  105 106 104  --  99  CO2 27  --  27 29 26   --  29  GLUCOSE 119*  --  115* 111* 113*  --  113*  BUN 15  --  15 16 13   --  14  CREATININE 0.60  --  0.59 0.69 0.60  --  0.71  CALCIUM 9.0  --  8.9 9.1 8.8*  --  9.2  MG 2.1   < > 2.1 2.2 2.1 2.1 2.1  PHOS 2.5   < > 3.1 3.6 3.5 4.2 4.1   < > = values in this interval not displayed.    GFR: Estimated Creatinine Clearance: 71.7 mL/min (by C-G formula based on SCr of 0.71 mg/dL).  Liver Function Tests: Recent Labs  Lab 10/21/19 0554 10/22/19 0439 10/23/19 0418 10/24/19 0413 10/25/19 0315  AST 18 15 24 28  32  ALT 13 15 25  35 42  ALKPHOS 64 65 71 85 95  BILITOT 1.1 1.2 1.3* 1.4* 1.1  PROT 5.6* 5.7* 5.5* 5.5* 5.4*  ALBUMIN 2.6* 2.6* 2.3* 2.4* 2.3*    CBG: No results for input(s): GLUCAP in the last 168 hours.   Recent Results (from the past 240 hour(s))  SARS Coronavirus 2 by RT PCR (hospital order, performed in [facility] hospital lab) Nasopharyngeal Nasopharyngeal Swab     Status: None   Collection Time: 10/18/19  8:53 AM   Specimen: Nasopharyngeal Swab  Result Value Ref Range Status   SARS Coronavirus 2 NEGATIVE NEGATIVE Final    Comment: (NOTE) SARS-CoV-2 target nucleic acids are NOT DETECTED. The SARS-CoV-2 RNA is generally detectable in upper and lower respiratory specimens during the acute phase of infection. The lowest concentration of SARS-CoV-2 viral copies this assay can detect is 250 copies / mL. Laquida Cotrell negative result does not  preclude SARS-CoV-2 infection and should not be used as the sole basis for treatment or other patient management decisions.  Serafino Burciaga negative result may occur with improper specimen collection / handling, submission of specimen other than nasopharyngeal swab, presence of viral mutation(s) within the areas targeted by this assay, and inadequate number of viral copies (<250 copies / mL). Khamille Beynon negative result must be combined with clinical observations, patient history, and epidemiological information. Fact Sheet for Patients:   StrictlyIdeas.no Fact Sheet for Healthcare Providers: BankingDealers.co.za This test is not yet approved or cleared  by the [place] FDA and has been authorized for detection and/or diagnosis of SARS-CoV-2 by FDA under an Emergency Use Authorization (EUA).  This EUA will remain in effect (meaning this test can be used) for the duration of the COVID-19 declaration under Section 564(b)(1) of the Act, 21 U.S.C. section 360bbb-3(b)(1), unless the authorization is terminated or revoked sooner. Performed at [facility], [place] [address] [place], [place] [address]   Urine Culture     Status: Abnormal   Collection Time: 10/18/19 11:15 AM   Specimen: Urine, Random  Result Value Ref Range Status   Specimen Description   Final    URINE, RANDOM Performed at [place] [address] [place], [place] [address]    Special Requests   Final    NONE Performed at [facility], [place] [address], [place], [place] [address]    Culture (Jaxiel Kines)  Final    >=100,000 COLONIES/mL SERRATIA MARCESCENS >=100,000 COLONIES/mL ESCHERICHIA COLI    Report Status 10/22/2019 FINAL  Final   Organism ID, Bacteria SERRATIA MARCESCENS (Lakaisha Danish)  Final   Organism ID, Bacteria ESCHERICHIA COLI (Zachariah Pavek)  Final  Susceptibility   Escherichia coli - MIC*    AMPICILLIN 16 INTERMEDIATE Intermediate      CEFAZOLIN >=64 RESISTANT Resistant     CEFTRIAXONE <=1 SENSITIVE Sensitive     CIPROFLOXACIN <=0.25 SENSITIVE Sensitive     GENTAMICIN <=1 SENSITIVE Sensitive     IMIPENEM 0.5 SENSITIVE Sensitive     NITROFURANTOIN 256 RESISTANT Resistant     TRIMETH/SULFA <=20 SENSITIVE Sensitive     AMPICILLIN/SULBACTAM 8 SENSITIVE Sensitive     PIP/TAZO <=4 SENSITIVE Sensitive     * >=100,000 COLONIES/mL ESCHERICHIA COLI   Serratia marcescens - MIC*    CEFAZOLIN >=64 RESISTANT Resistant     CEFTRIAXONE <=1 SENSITIVE Sensitive     CIPROFLOXACIN <=0.25 SENSITIVE Sensitive     GENTAMICIN <=1 SENSITIVE Sensitive     NITROFURANTOIN RESISTANT Resistant     TRIMETH/SULFA <=20 SENSITIVE Sensitive     * >=100,000 COLONIES/mL SERRATIA MARCESCENS  Culture, blood (Routine X 2) w Reflex to ID Panel     Status: None   Collection Time: 10/18/19 11:16 AM   Specimen: BLOOD  Result Value Ref Range Status   Specimen Description   Final    BLOOD RIGHT ANTECUBITAL Performed at [facility], [address], [place], [place] [address]    Special Requests   Final    BOTTLES DRAWN AEROBIC AND ANAEROBIC Blood Culture adequate volume Performed at [facility], [address] [place], [place] [address]    Culture   Final    NO GROWTH 5 DAYS Performed at [facility], [address] [place], [place] [address]    Report Status 10/23/2019 FINAL  Final  Culture, blood (Routine X 2) w Reflex to ID Panel     Status: None   Collection Time: 10/18/19 11:16 AM   Specimen: BLOOD LEFT HAND  Result Value Ref Range Status   Specimen Description   Final    BLOOD LEFT HAND Performed at [facility], [address] [place], [place] [address]    Special Requests   Final    BOTTLES DRAWN AEROBIC AND ANAEROBIC Blood Culture adequate volume Performed at [facility], [address] [place], [place] [address]    Culture   Final    NO GROWTH 5  DAYS Performed at [facility], [address], [place], [place] [address]    Report Status 10/23/2019 FINAL  Final  Surgical PCR screen     Status: Abnormal   Collection Time: 10/20/19  4:57 AM   Specimen: Nasal Mucosa; Nasal Swab  Result Value Ref Range Status   MRSA, PCR NEGATIVE NEGATIVE Final   Staphylococcus aureus POSITIVE (Aveleen Nevers) NEGATIVE Final    Comment: (NOTE) The Xpert SA Assay (FDA approved for NASAL specimens in patients 52 years of age and older), is one component of Quentez Lober comprehensive surveillance program. It is not intended to diagnose infection nor to guide or monitor treatment. Performed at [facility], [address] [place], [place] [address]   SARS CORONAVIRUS 2 (TAT 6-24 HRS) Nasopharyngeal Nasopharyngeal Swab     Status: None   Collection Time: 10/26/19  1:30 AM   Specimen: Nasopharyngeal Swab  Result Value Ref Range Status   SARS Coronavirus 2 NEGATIVE NEGATIVE Final    Comment: (NOTE) SARS-CoV-2 target nucleic acids are NOT DETECTED. The SARS-CoV-2 RNA is generally detectable in upper and lower respiratory specimens during the acute phase of infection. Negative results do not preclude SARS-CoV-2  infection, do not rule out co-infections with other pathogens, and should not be used as the sole basis for treatment or other patient management decisions. Negative results must be combined with clinical observations, patient history, and epidemiological information. The expected result is Negative. Fact Sheet for Patients: HairSlick.no Fact Sheet for Healthcare Providers: quierodirigir.com This test is not yet approved or cleared by the [place] FDA and  has been authorized for detection and/or diagnosis of SARS-CoV-2 by FDA under an Emergency Use Authorization (EUA). This EUA will remain  in effect (meaning this test can be used) for the duration of the COVID-19 declaration under  Section 56 4(b)(1) of the Act, 21 U.S.C. section 360bbb-3(b)(1), unless the authorization is terminated or revoked sooner. Performed at [facility], [address], [place], [place] [address]          Radiology Studies: No results found.      Scheduled Meds: . aspirin EC  325 mg Oral Q breakfast  . cholecalciferol  2,000 Units Oral Daily  . diclofenac  1 patch Transdermal BID  . ferrous sulfate  325 mg Oral TID PC  . magnesium oxide  400 mg Oral Daily  . metoprolol succinate  12.5 mg Oral Daily  . pantoprazole  40 mg Oral Daily  . trazodone  300 mg Oral QHS  . zinc sulfate  220 mg Oral Daily   Continuous Infusions: . sodium chloride 10 mL/hr at 10/24/19 1825  . cefTRIAXone (ROCEPHIN)  IV 1 g (10/27/19 1417)     LOS: 9 days    Time spent: over 30 min    Lacretia Nicks, MD [facility]   To contact the attending provider between 7A-7P or the covering provider during after hours 7P-7A, please log into the web site [email or web address] and access using universal [place] password for that web site. If you do not have the password, please call the hospital operator.  10/27/2019, 3:08 PM

## 2019-10-27 NOTE — Progress Notes (Signed)
Occupational Therapy Treatment Patient Details Name: Leslie Dean MRN: [id number] DOB: [birth year] Today's Date: 10/27/2019    History of present illness Patient is a 74 year old female with chronic back pain secondary to scoliosis, spinal stenosis, hypertension, Marfan syndrome, urinary incontinence presented to ED 10/18/19 with fall at home resulting in left hip pain. Xray-Comminuted intertrochanteric femur fracture proximally .S/P IM nailing 10/19/19   OT comments  Treatment focused on improving patient's functional mobility and standing in preparation for self care tasks. Patient required mod x 2 to stand from recliner and use of RW and transfer to bed. Max assist to transfer back in to supine. Patient needed tactile and verbal cues for technique as well as assistance for walker management when transferring.  Cont POC   Follow Up Recommendations  SNF    Equipment Recommendations  Other (comment)    Recommendations for Other Services      Precautions / Restrictions Precautions Precautions: Fall Precaution Comments: pre medicate for pain and also has L 4th Rib Fx and severe scoliosis Restrictions Weight Bearing Restrictions: Yes LLE Weight Bearing: Partial weight bearing LLE Partial Weight Bearing Percentage or Pounds: 50%       Mobility Bed Mobility Overal bed mobility: Needs Assistance       Supine to sit: Mod assist;+2 for safety/equipment     General bed mobility comments: OOB on BSC  Transfers Overall transfer level: Needs assistance Equipment used: Bilateral platform walker;None Transfers: Sit to/from [facility] Sit to Stand: Max assist;Total assist;+2 physical assistance;+2 safety/equipment;From elevated surface Stand pivot transfers: Total assist;+2 physical assistance;+2 safety/equipment;From elevated surface       General transfer comment: Patient seated in recliner when therapist entered the room. Patient reports feeling stiff after  sitting in the chair for extended amount of time. Patient wanting to slide into bed however therapist encouraged standing and taking steps - as she needed to improve her functional mobility. Patient mod x 2 to stand with verbal cues for hand placement and leg placement to stand from low recliner height. Patient able to hold onto walker with two hands - with verbal cue to place right hand on walker. Patient able to take several steps to bed with RW - with cueing for sequencing and assistance for walker management as well as assistance of two for steadying and safety. Patient fatigued quickly and therapist provided tactile cues and posterior handling to get patient to sit at edge of bed. Patient max assist to return to supine.    Balance                                           ADL either performed or assessed with clinical judgement   ADL                                               Vision       Perception     Praxis      Cognition Arousal/Alertness: Awake/alert Behavior During Therapy: WFL for tasks assessed/performed Overall Cognitive Status: Within Functional Limits for tasks assessed                                 General Comments:  AxO x 4 a Retired Comptroller.  Less anxiety this session.  More cooperative and willing each session.  Less anxiety .  Hyper talkative and distracted to task. Requires redirection.        Exercises     Shoulder Instructions       General Comments      Pertinent Vitals/ Pain       Pain Assessment: Faces Pain Score: 7  Faces Pain Scale: Hurts little more Pain Location: L hip with PROM to left to slide over in bed Pain Descriptors / Indicators: Aching;Discomfort;Moaning;Grimacing Pain Intervention(s): Monitored during session;Premedicated before session;Repositioned;Ice applied  Home Living                                          Prior Functioning/Environment               Frequency  Min 2X/week        Progress Toward Goals  OT Goals(current goals can now be found in the care plan section)        Plan      Co-evaluation          OT goals addressed during session: Other (comment)(functional mobility needed prior to self care tasks)      AM-PAC OT "6 Clicks" Daily Activity     Outcome Measure                    End of Session    Pain - Right/Left: Left Pain - part of body: Hip   Activity Tolerance Patient limited by pain   Patient Left in bed;with call bell/phone within reach;with bed alarm set;with family/visitor present   Nurse Communication Mobility status        Time: 5284-1324 OT Time Calculation (min): 16 min  Charges: OT General Charges $OT Visit: 1 Visit OT Treatments $Therapeutic Activity: 8-22 mins  Waldron Session, OTR/L Acute Care Rehab Services  Office [phone]    Kelli Churn 10/27/2019, 4:32 PM

## 2019-10-27 NOTE — Progress Notes (Signed)
Physical Therapy Treatment Patient Details Name: Leslie Dean MRN: [id number] DOB: August 16, 1945 Today's Date: 10/27/2019    History of Present Illness Patient is a 74 year old female with chronic back pain secondary to scoliosis, spinal stenosis, hypertension, Marfan syndrome, urinary incontinence presented to ED 10/18/19 with fall at home resulting in left hip pain. Xray-Comminuted intertrochanteric femur fracture proximally .S/P IM nailing 10/19/19    PT Comments    POD # 9 General bed mobility comments: using her belt strap pt was able to self asssit L LE partially OOB with 50% VC's on proper tech and increased, increased time.  Greatest difficulty scooting to EOB due to "bad L shoulder", L hip pain and BMI.  Getting better each session. Assisted from elevated bed to [facility] 1/4 pivot transfer + 2 asssit.  Allowed increased time to sit on [facility] per pt request for a BM.  Call light in reach.   Follow Up Recommendations  SNF;Supervision/Assistance - 24 hour     Equipment Recommendations       Recommendations for Other Services       Precautions / Restrictions Precautions Precautions: Fall Precaution Comments: pre medicate for pain and also has L 4th Rib Fx and severe scoliosis Restrictions Weight Bearing Restrictions: Yes LLE Weight Bearing: Partial weight bearing LLE Partial Weight Bearing Percentage or Pounds: 50%    Mobility  Bed Mobility Overal bed mobility: Needs Assistance       Supine to sit: Mod assist;+2 for safety/equipment     General bed mobility comments: using her belt strap pt was able to self asssit L LE partially OOB with 50% VC's on proper tech and increased, increased time.  Greatest difficulty scooting to EOB due to "bad L shoulder", L hip pain and BMI.  Getting better each session.  Transfers Overall transfer level: Needs assistance Equipment used: Bilateral platform walker;None Transfers: Sit to/from [employer] Transfers Sit to Stand: Max  assist;Total assist;+2 physical assistance;+2 safety/equipment;From elevated surface Stand pivot transfers: Total assist;+2 physical assistance;+2 safety/equipment;From elevated surface    from elevated bed to [facility] 1/4 pivot towrads her RIGHT with 50% VC's on proper hand transfer and assist to complete turn.               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  Less anxiety this session.  More cooperative and willing each session.  Less anxiety .  Hyper talkative and distracted to task. Requires redirection.      Exercises      General Comments        Pertinent Vitals/Pain Pain Assessment: 0-10 Pain Score: 7  Faces Pain Scale: Hurts whole lot Pain Location: L hip Pain Descriptors / Indicators: Aching;Discomfort;Moaning;Grimacing Pain Intervention(s): Monitored during session;Premedicated before session;Repositioned;Ice applied    Home Living  Prior Function            PT Goals (current goals can now be found in the care plan section) Progress towards PT goals: Progressing toward goals    Frequency    Min 3X/week      PT Plan Current plan remains appropriate    Co-evaluation              AM-PAC PT "6 Clicks" Mobility   Outcome Measure  Help needed turning from your back to your side while in a flat bed without using bedrails?: A Lot Help needed moving from lying on your back to sitting on the side of a flat bed without using bedrails?: A Lot Help needed moving to and from a bed to a chair (including a wheelchair)?: A Lot Help needed standing up from a  chair using your arms (e.g., wheelchair or bedside chair)?: Total Help needed to walk in hospital room?: Total Help needed climbing 3-5 steps with a railing? : Total 6 Click Score: 9    End of Session Equipment Utilized During Treatment: Gait belt Activity Tolerance: Patient limited by fatigue;Patient limited by pain Patient left: in chair Nurse Communication: Mobility status PT Visit Diagnosis: Unsteadiness on feet (R26.81);Difficulty in walking, not elsewhere classified (R26.2)     Time: 3845-3646 PT Time Calculation (min) (ACUTE ONLY): 14 min  Charges:  $Therapeutic Activity: 8-22 mins                     Rica Koyanagi  PTA Acute  Rehabilitation Services Pager      [phone] Office      [phone]

## 2019-10-28 ENCOUNTER — Inpatient Hospital Stay (HOSPITAL_COMMUNITY): Payer: PPO

## 2019-10-28 LAB — CBC WITH DIFFERENTIAL/PLATELET
Abs Immature Granulocytes: 0.14 10*3/uL — ABNORMAL HIGH (ref 0.00–0.07)
Basophils Absolute: 0 10*3/uL (ref 0.0–0.1)
Basophils Relative: 0 %
Eosinophils Absolute: 0.2 10*3/uL (ref 0.0–0.5)
Eosinophils Relative: 2 %
HCT: 31.4 % — ABNORMAL LOW (ref 36.0–46.0)
Hemoglobin: 9.6 g/dL — ABNORMAL LOW (ref 12.0–15.0)
Immature Granulocytes: 1 %
Lymphocytes Relative: 23 %
Lymphs Abs: 2.9 10*3/uL (ref 0.7–4.0)
MCH: 29.1 pg (ref 26.0–34.0)
MCHC: 30.6 g/dL (ref 30.0–36.0)
MCV: 95.2 fL (ref 80.0–100.0)
Monocytes Absolute: 1.4 10*3/uL — ABNORMAL HIGH (ref 0.1–1.0)
Monocytes Relative: 11 %
Neutro Abs: 8 10*3/uL — ABNORMAL HIGH (ref 1.7–7.7)
Neutrophils Relative %: 63 %
Platelets: 309 10*3/uL (ref 150–400)
RBC: 3.3 MIL/uL — ABNORMAL LOW (ref 3.87–5.11)
RDW: 14.6 % (ref 11.5–15.5)
WBC: 12.6 10*3/uL — ABNORMAL HIGH (ref 4.0–10.5)
nRBC: 0 % (ref 0.0–0.2)

## 2019-10-28 LAB — COMPREHENSIVE METABOLIC PANEL
ALT: 25 U/L (ref 0–44)
AST: 16 U/L (ref 15–41)
Albumin: 2.3 g/dL — ABNORMAL LOW (ref 3.5–5.0)
Alkaline Phosphatase: 92 U/L (ref 38–126)
Anion gap: 8 (ref 5–15)
BUN: 13 mg/dL (ref 8–23)
CO2: 28 mmol/L (ref 22–32)
Calcium: 8.9 mg/dL (ref 8.9–10.3)
Chloride: 100 mmol/L (ref 98–111)
Creatinine, Ser: 0.66 mg/dL (ref 0.44–1.00)
GFR calc Af Amer: >60 mL/min (ref >60)
GFR calc non Af Amer: >60 mL/min (ref >60)
Glucose, Bld: 102 mg/dL — ABNORMAL HIGH (ref 70–99)
Potassium: 4.1 mmol/L (ref 3.5–5.1)
Sodium: 136 mmol/L (ref 135–145)
Total Bilirubin: 1.1 mg/dL (ref 0.3–1.2)
Total Protein: 5.6 g/dL — ABNORMAL LOW (ref 6.5–8.1)

## 2019-10-28 LAB — SYNOVIAL CELL COUNT + DIFF, W/ CRYSTALS
Crystals, Fluid: NONE SEEN
Monocyte-Macrophage-Synovial Fluid: 6 % — ABNORMAL LOW (ref 50–90)
Neutrophil, Synovial: 94 % — ABNORMAL HIGH (ref 0–25)
WBC, Synovial: UNDETERMINED /mm3 (ref 0–200)

## 2019-10-28 LAB — GRAM STAIN: Gram Stain: NONE SEEN

## 2019-10-28 LAB — URIC ACID: Uric Acid, Serum: 4.9 mg/dL (ref 2.5–7.1)

## 2019-10-28 LAB — PHOSPHORUS: Phosphorus: 3.6 mg/dL (ref 2.5–4.6)

## 2019-10-28 LAB — MAGNESIUM: Magnesium: 2 mg/dL (ref 1.7–2.4)

## 2019-10-28 MED ORDER — DICLOFENAC SODIUM 1 % EX GEL: 2.0000 g | Freq: Four times a day (QID) | CUTANEOUS | Status: DC

## 2019-10-28 MED ORDER — SODIUM CHLORIDE 0.9 % IV SOLN
2.0000 g | INTRAVENOUS | Status: DC
  Administered 2019-10-29 – 2019-10-30 (×2): 2 g via INTRAVENOUS
  Filled 2019-10-28: qty 2
  Filled 2019-10-28: qty 20

## 2019-10-28 MED ORDER — VANCOMYCIN HCL 1750 MG/350ML IV SOLN
1750.0000 mg | Freq: Once | INTRAVENOUS | Status: AC
  Administered 2019-10-28: 1750 mg via INTRAVENOUS
  Filled 2019-10-28: qty 350

## 2019-10-28 MED ORDER — VANCOMYCIN HCL IN DEXTROSE 1-5 GM/200ML-% IV SOLN
1000.0000 mg | Freq: Two times a day (BID) | INTRAVENOUS | Status: DC
  Administered 2019-10-29 – 2019-10-30 (×3): 1000 mg via INTRAVENOUS
  Filled 2019-10-28 (×4): qty 200

## 2019-10-28 MED ORDER — METHYLPREDNISOLONE ACETATE 80 MG/ML IJ SUSP
80.0000 mg | Freq: Once | INTRAMUSCULAR | Status: AC
  Administered 2019-10-28: 80 mg via INTRA_ARTICULAR
  Filled 2019-10-28: qty 1

## 2019-10-28 MED ORDER — LIDOCAINE HCL 0.5 % IJ SOLN
10.0000 mL | Freq: Once | INTRAMUSCULAR | Status: AC
  Administered 2019-10-28: 10 mL via INTRADERMAL
  Filled 2019-10-28: qty 10

## 2019-10-28 NOTE — Progress Notes (Signed)
     Subjective: 10 Days Post-Op Procedure(s) (LRB): INTRAMEDULLARY (IM) NAIL FEMORAL (Left)   Patient reports pain as severe in the right knee will effusion.  X-rays were taken which reveal minimal OA changes.  Discussed various options and she does wish to have the knee aspirated.     Objective:   VITALS:   Vitals:   10/28/19 0546 10/28/19 1326  BP: (!) 117/45 (!) 101/51  Pulse: 76 78  Resp:  16  Temp:  98.1 F (36.7 C)  SpO2: 95% 94%    Dorsiflexion/Plantar flexion intact Incision: dressing C/D/I No cellulitis present Compartment soft  LABS Recent Labs    10/27/19 0548 10/28/19 0412  HGB 9.6* 9.6*  HCT 31.0* 31.4*  WBC 15.0* 12.6*  PLT 333 309    Recent Labs    10/27/19 0548 10/28/19 0412  NA 137 136  K 4.4 4.1  BUN 14 13  CREATININE 0.71 0.66  GLUCOSE 113* 102*     Assessment/Plan: 10 Days Post-Op Procedure(s) (LRB): INTRAMEDULLARY (IM) NAIL FEMORAL (Left)   Procedure:  Right knee aspiration The knee was cleaned and prepped and the site was injected with 5cc of  Lidocaine .5% to help numb the area of aspiration. Once the area had time to become numb the area was cleaned and prepped once again and an 18 gauge needle was use to aspirate the right knee.  40cc of sight cloudy serosanguinous fluid was obtain, the fluid was more serous in nature and didn't have the gross infection appearance.  The area was cleaned and dressed. The aspirate was labelled and brought to the lab for cell count and gram stain. I fear with the blood that was also in the aspiration that might interfere with the cell count.        Lanney Gins PA-C  [facility]  [facility] [address], [place], [place] [address] Phone: [phone] [email or web address] Facebook  [employer]

## 2019-10-28 NOTE — Progress Notes (Signed)
Pharmacy Antibiotic Note  Leslie Dean is a 74 y.o. female admitted on 10/18/2019 with possible septic arthritis.  Pharmacy has been consulted for vancomycin dosing. D#2 CTX 1 gm q24 for UTI/asymptomacic bacturia  w/ > 100K Serratia marscens & > 100K E coli.  WBC 12.6, SCr WNL, AF  Plan: Vancomycin 1750 mg IV loading dose Vancomycin 1000 IV every 12 hours.  Goal trough 15-20 mcg/mL.  Increase CTX to 2 gm q24 for r/o septic arthritis & cover UTI F/u renal fxn, WBC, temp, culture data Vancomycin trough as needed  Height: 5\' 7"  (170.2 cm) Weight: 88.9 kg (196 lb) IBW/kg (Calculated) : 61.6  Temp (24hrs), Avg:98 F (36.7 C), Min:97.8 F (36.6 C), Max:98.1 F (36.7 C)  Recent Labs  Lab 10/23/19 0418 10/24/19 0413 10/25/19 0315 10/27/19 0548 10/28/19 0412  WBC 11.5* 11.6* 12.6* 15.0* 12.6*  CREATININE 0.59 0.69 0.60 0.71 0.66    Estimated Creatinine Clearance: 71.7 mL/min (by C-G formula based on SCr of 0.66 mg/dL).    Allergies  Allergen Reactions  . Other     Says narcotics cause nausea for her   Antimicrobials this admission: 5/20 vanc>> 5/19 CTX>> Dose adjustments this admission: 5/21 increase CTX to 2 gm q24 to cover septic arthritis  Microbiology results: 5/20 R Knee joint: gram stain negative F 5/20 R knee: sent 5/18 Covid neg 5/12 MRSA neg 5/12 MSSA  Pos 5/10 BCx2: ngF 5/10 UCx: > 100K serratia marscens- R cefazolin & NTF > 100K E coli - Int Amp, R  Cefazolin & NTF   Thank you for allowing pharmacy to be a part of this patient's care.  7/12, Pharm.D 10/28/2019 2:37 PM

## 2019-10-28 NOTE — Progress Notes (Signed)
Physical Therapy Treatment Patient Details Name: Leslie Dean MRN: [id number] DOB: 09/17/1945 Today's Date: 10/28/2019    History of Present Illness Patient is a 74 year old female with chronic back pain secondary to scoliosis, spinal stenosis, hypertension, Marfan syndrome, urinary incontinence presented to ED 10/18/19 with fall at home resulting in left hip pain. Xray-Comminuted intertrochanteric femur fracture proximally .S/P IM nailing 10/19/19    PT Comments    POD # 10 New onset of R knee pain 10/10 and unable to tolerate WBing.  General bed mobility comments: using her belt strap and increased time, pt was able to partially transfer self to EOB 50% assist to complete.  Getting back into bed, pt required 100% assist. General transfer comment: pt was unable to tolerate any WBing R LE due to R knee pain (new onset) and was unable to clear hips off bed. Assisted back to bed.    Follow Up Recommendations  SNF;Supervision/Assistance - 24 hour     Equipment Recommendations       Recommendations for Other Services       Precautions / Restrictions Precautions Precautions: Fall Precaution Comments: pre medicate for pain and also has L 4th Rib Fx and severe scoliosis.  New onset R knee pain with Aspiration 10/28/19 Restrictions Weight Bearing Restrictions: Yes LLE Weight Bearing: Partial weight bearing LLE Partial Weight Bearing Percentage or Pounds: 50%    Mobility  Bed Mobility Overal bed mobility: Needs Assistance Bed Mobility: Supine to Sit;Sit to Supine     Supine to sit: Mod assist;+2 for safety/equipment Sit to supine: Total assist;+2 for physical assistance   General bed mobility comments: using her belt strap and increased time, pt was able to partially transfer self to EOB 50% assist to complete.  Getting back into bed, pt required 100% assist.  Transfers Overall transfer level: Needs assistance Equipment used: Bilateral platform walker Transfers: Sit to/from  Stand Sit to Stand: Total assist;+2 physical assistance;+2 safety/equipment         General transfer comment: pt was unable to tolerate any WBing R LE due to R knee pain (new onset) and was unable to clear hips off bed.  Ambulation/Gait             General Gait Details: unable   Stairs             Wheelchair Mobility    Modified Rankin (Stroke Patients Only)       Balance                                            Cognition Arousal/Alertness: Awake/alert Behavior During Therapy: WFL for tasks assessed/performed Overall Cognitive Status: Within Functional Limits for tasks assessed                                 General Comments: AxO x 4 upset today because her R knee has "flarred up" 10/10 pain and unable to WB onit      Exercises      General Comments        Pertinent Vitals/Pain Pain Assessment: 0-10 Pain Score: 10-Worst pain ever Pain Location: R knee Pain Descriptors / Indicators: Throbbing Pain Intervention(s): Monitored during session;Premedicated before session;Repositioned    Home Living  Prior Function            PT Goals (current goals can now be found in the care plan section) Progress towards PT goals: Progressing toward goals    Frequency    Min 3X/week      PT Plan Current plan remains appropriate    Co-evaluation              AM-PAC PT "6 Clicks" Mobility   Outcome Measure  Help needed turning from your back to your side while in a flat bed without using bedrails?: Total Help needed moving from lying on your back to sitting on the side of a flat bed without using bedrails?: Total Help needed moving to and from a bed to a chair (including a wheelchair)?: Total Help needed standing up from a chair using your arms (e.g., wheelchair or bedside chair)?: Total Help needed to walk in hospital room?: Total Help needed climbing 3-5 steps with a railing? :  Total 6 Click Score: 6    End of Session Equipment Utilized During Treatment: Gait belt Activity Tolerance: Patient limited by pain Patient left: in bed;with call bell/phone within reach Nurse Communication: Mobility status PT Visit Diagnosis: Unsteadiness on feet (R26.81);Difficulty in walking, not elsewhere classified (R26.2)     Time: 5465-0354 PT Time Calculation (min) (ACUTE ONLY): 25 min  Charges:  $Therapeutic Activity: 23-37 mins                     Rica Koyanagi  PTA Acute  Rehabilitation Services Pager      [phone] Office      [phone]

## 2019-10-28 NOTE — Progress Notes (Signed)
PROGRESS NOTE    Leslie Dean  BWI:[id number] DOB: 11/24/1945 DOA: 10/18/2019 PCP: Patient, No Pcp Per   Chief Complaint  Patient presents with  . Fall  . 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[employer]) PMHx Marfan syndrome,Chronic back pain with some scoliosis and on multiple meds previously not on any now,HTN Prior admission large hepatic cyst 12/06/2012-(was benign)   Accidental fall-immediate left hip pain 10/10-Rx NAD 200 mcg fentanyl 1 mg Dilaudid 4 mg morphine = hypoxia in ED  EKG on admit RBBB LPF B Labs pretty normal other than white count 19 on admission  Assessment & Plan:   Principal Problem:   Closed left hip fracture (HCC) Active Problems:   HTN (hypertension)   Marfans syndrome   Acute respiratory failure (HCC)   Acute lower UTI  LEFT proximal femur fracture -S/p 5/10 open reduction, internal fixation. See procedure below  -CIR has evaluated patient and declined admission. -5/13 patient counseled at length that she must participate with PT/OT if she is to prevent sequela of not using her new joint; i.e. scar tissue buildup causing patient not to be able to use left hip possibly leading to her being wheelchair-bound. Daughter present -5/16 patient performing physical therapy exercises aided by her children - ASA 81 mg BID for dvt ppx - she prefers to not use any opiates - PWB 50% on the L leg - Left shin with some swelling, looks like hematoma, will follow tib/fib imaging - without acute abnormality  Right Knee Effusion: tender, swollen this morning.   40 cc cloudy fluid aspirated by ortho Unfortunately unable to perform WBC count, but 94% neutrophil and turbid appearance - no crystals Neutrophil predominance and turbid appearance would be concerning for infection -> follow culture Gram stain without organism Continue ceftriaxone, add vanc Appreciate ortho recs  Pain management -Patient has been resistant to working  with PT/OT -5/13 Norco 7.5-325 mg(hold) -5/13 Robaxin 500 mg (hold) -5/13 morphine discontinued -5/13 Percocet 7.5--325 mg discontinue -5/13 TylenolPRN -5/13 tramadol 50 mgPRN  Chest/abdominal pain -Most likely secondary to malpositioning of patient; previous fracture clavicle LEFT shoulder -Repeat EKG showed PVCs. No significant change from prior EKG in 2014  Respiratory failure with hypoxia -Most likely iatrogenic secondary to extensive amount of narcotics. -See pain management  Hypotension on admission -5/16 normal saline KVO -Resolved  Marfan syndrome, scoliosis -See pain management   LEFT fourth fractured rib -Most likely secondary to patient's fall -See pain management -Incentive spirometry  Leukocytosis  Serratia Marcescens and E. Coli UTI: No clear sx, but with elevated WBC, will treat  Goals of care -5/14 Reconsult inpatient Rehab patient now more alert and interactive.  Patient should be Leslie Dean good candidate for CIR. -5/18 had Leslie Dean long discussion with patient, who now understands CIR has declined admission.  She also understands that her children are challenging this ruling.  She plans on speaking with her children tonight, and may agree to SNF or home health (do not believe she would do well) in the Leslie Dean.m. -5/19 plan for SNF in AM  DVT prophylaxis: aspirin  Code Status: full Family Communication: son, daughter Disposition:   Status is: Inpatient  Remains inpatient appropriate because:Inpatient level of care appropriate due to severity of illness   Dispo: The patient is from: SNF              Anticipated d/c is to: SNF  Anticipated d/c date is: 1 day              Patient currently is not medically stable to d/c.   Consultants:   orthopedics  Procedures:  5/11  Left proximal femur, reverse obliquity intertrochanteric femur fracture.   Antimicrobials:  Anti-infectives (From admission, onward)   Start     Dose/Rate Route Frequency  Ordered Stop   10/29/19 0800  cefTRIAXone (ROCEPHIN) 2 g in sodium chloride 0.9 % 100 mL IVPB     2 g 200 mL/hr over 30 Minutes Intravenous Every 24 hours 10/28/19 1432     10/29/19 0600  vancomycin (VANCOCIN) IVPB 1000 mg/200 mL premix     1,000 mg 200 mL/hr over 60 Minutes Intravenous Every 12 hours 10/28/19 1438     10/28/19 1500  vancomycin (VANCOREADY) IVPB 1750 mg/350 mL     1,750 mg 175 mL/hr over 120 Minutes Intravenous  Once 10/28/19 1417     10/27/19 1300  cefTRIAXone (ROCEPHIN) 1 g in sodium chloride 0.9 % 100 mL IVPB  Status:  Discontinued     1 g 200 mL/hr over 30 Minutes Intravenous Every 24 hours 10/27/19 1229 10/28/19 1432   10/19/19 1000  cefTRIAXone (ROCEPHIN) 1 g in sodium chloride 0.9 % 100 mL IVPB  Status:  Discontinued     1 g 200 mL/hr over 30 Minutes Intravenous Every 24 hours 10/18/19 1507 10/19/19 1007   10/19/19 0600  ceFAZolin (ANCEF) IVPB 2g/100 mL premix  Status:  Discontinued     2 g 200 mL/hr over 30 Minutes Intravenous On call to O.R. 10/18/19 1603 10/18/19 1817   10/19/19 0400  ceFAZolin (ANCEF) IVPB 2g/100 mL premix     2 g 200 mL/hr over 30 Minutes Intravenous Every 6 hours 10/18/19 2324 10/19/19 1043   10/18/19 2047  ceFAZolin (ANCEF) 2-4 GM/100ML-% IVPB    Note to Pharmacy: Leslie Dean   : cabinet override      10/18/19 2047 10/19/19 0859   10/18/19 0945  cefTRIAXone (ROCEPHIN) 1 g in sodium chloride 0.9 % 100 mL IVPB     1 g 200 mL/hr over 30 Minutes Intravenous  Once 10/18/19 0925 10/18/19 1111     Subjective: C/o R knee swelling   Objective: Vitals:   10/27/19 2201 10/28/19 0518 10/28/19 0546 10/28/19 1326  BP: (!) 105/53 91/73 (!) 117/45 (!) 101/51  Pulse: 76 83 76 78  Resp: 16 15  16   Temp: 97.9 F (36.6 C) 98 F (36.7 C)  98.1 F (36.7 C)  TempSrc:  Oral  Oral  SpO2: 94% (!) 83% 95% 94%  Weight:      Height:        Intake/Output Summary (Last 24 hours) at 10/28/2019 1624 Last data filed at 10/28/2019 1458 Gross per 24  hour  Intake 960 ml  Output 1900 ml  Net -940 ml   Filed Weights   10/18/19 1700  Weight: 88.9 kg    Examination:  General: No acute distress. Cardiovascular: Heart sounds show Leslie Dean regular rate, and rhythm. Lungs: Clear to auscultation bilaterally  Abdomen: Soft, nontender, nondistended Neurological: Alert and oriented 3. Moves all extremities 4 . Cranial nerves II through XII grossly intact. Skin: Warm and dry. No rashes or lesions. Extremities: swollen R knee, tender to palpation      Data Reviewed: I have personally reviewed following labs and imaging studies  CBC: Recent Labs  Lab 10/22/19 0439 10/22/19 0439 10/23/19 0418 10/24/19 0413 10/25/19 0315 10/27/19 0548 10/28/19  0412  WBC 13.1*   < > 11.5* 11.6* 12.6* 15.0* 12.6*  NEUTROABS 9.8*  --  8.2* 7.9* 9.4*  --  8.0*  HGB 10.4*   < > 10.2* 10.2* 10.2* 9.6* 9.6*  HCT 33.3*   < > 32.1* 32.5* 32.9* 31.0* 31.4*  MCV 93.0   < > 93.3 93.4 94.8 94.5 95.2  PLT 154   < > 176 204 234 333 309   < > = values in this interval not displayed.    Basic Metabolic Panel: Recent Labs  Lab 10/23/19 0418 10/23/19 0418 10/24/19 0413 10/25/19 0315 10/26/19 0329 10/27/19 0548 10/28/19 0412  NA 138  --  142 139  --  137 136  K 3.9  --  3.8 3.9  --  4.4 4.1  CL 105  --  106 104  --  99 100  CO2 27  --  29 26  --  29 28  GLUCOSE 115*  --  111* 113*  --  113* 102*  BUN 15  --  16 13  --  14 13  CREATININE 0.59  --  0.69 0.60  --  0.71 0.66  CALCIUM 8.9  --  9.1 8.8*  --  9.2 8.9  MG 2.1   < > 2.2 2.1 2.1 2.1 2.0  PHOS 3.1   < > 3.6 3.5 4.2 4.1 3.6   < > = values in this interval not displayed.    GFR: Estimated Creatinine Clearance: 71.7 mL/min (by C-G formula based on SCr of 0.66 mg/dL).  Liver Function Tests: Recent Labs  Lab 10/22/19 0439 10/23/19 0418 10/24/19 0413 10/25/19 0315 10/28/19 0412  AST 15 24 28  32 16  ALT 15 25 35 42 25  ALKPHOS 65 71 85 95 92  BILITOT 1.2 1.3* 1.4* 1.1 1.1  PROT 5.7* 5.5*  5.5* 5.4* 5.6*  ALBUMIN 2.6* 2.3* 2.4* 2.3* 2.3*    CBG: No results for input(s): GLUCAP in the last 168 hours.   Recent Results (from the past 240 hour(s))  Surgical PCR screen     Status: Abnormal   Collection Time: 10/20/19  4:57 AM   Specimen: Nasal Mucosa; Nasal Swab  Result Value Ref Range Status   MRSA, PCR NEGATIVE NEGATIVE Final   Staphylococcus aureus POSITIVE (Lamara Brecht) NEGATIVE Final    Comment: (NOTE) The Xpert SA Assay (FDA approved for NASAL specimens in patients 7 years of age and older), is one component of Gaspare Netzel comprehensive surveillance program. It is not intended to diagnose infection nor to guide or monitor treatment. Performed at [facility], [address], [place], [place]   SARS CORONAVIRUS 2 (TAT 6-24 HRS) Nasopharyngeal Nasopharyngeal Swab     Status: None   Collection Time: 10/26/19  1:30 AM   Specimen: Nasopharyngeal Swab  Result Value Ref Range Status   SARS Coronavirus 2 NEGATIVE NEGATIVE Final    Comment: (NOTE) SARS-CoV-2 target nucleic acids are NOT DETECTED. The SARS-CoV-2 RNA is generally detectable in upper and lower respiratory specimens during the acute phase of infection. Negative results do not preclude SARS-CoV-2 infection, do not rule out co-infections with other pathogens, and should not be used as the sole basis for treatment or other patient management decisions. Negative results must be combined with clinical observations, patient history, and epidemiological information. The expected result is Negative. Fact Sheet for Patients: 10/28/19 Fact Sheet for Healthcare Providers: HairSlick.no This test is not yet approved or cleared by the quierodirigir.com FDA and  has been authorized for detection and/or diagnosis of SARS-CoV-2 by FDA under an Emergency Use Authorization (EUA). This EUA will remain  in effect (meaning this test can be used) for the  duration of the COVID-19 declaration under Section 56 4(b)(1) of the Act, 21 U.S.C. section 360bbb-3(b)(1), unless the authorization is terminated or revoked sooner. Performed at [facility], [address] [place], [place] [address]   Stat Gram stain     Status: None   Collection Time: 10/28/19 12:30 PM   Specimen: Joint, Knee; Body Fluid  Result Value Ref Range Status   Specimen Description JOINT, KNEE  Final   Special Requests NONE  Final   Gram Stain   Final    NO ORGANISMS SEEN Gram Stain Report Called to,Read Back By and Verified With: [facility] RN AT 1310 ON 10/28/19 BY S.VANHOORNE Performed at [facility], [address] [place], [place] [address]    Report Status 10/28/2019 FINAL  Final         Radiology Studies: DG Tibia/Fibula Left  Result Date: 10/27/2019 CLINICAL DATA:  Persistent LEFT lower leg pain which the patient temporally relates to her LEFT femoral neck ORIF 9 days ago. No known injury. EXAM: LEFT TIBIA AND FIBULA - 2 VIEW COMPARISON:  None. FINDINGS: No evidence of acute, subacute or healed fractures. Mild osseous demineralization. No other intrinsic osseous abnormalities. Well-preserved joint spaces in the LEFT knee and the LEFT ankle. Intramedullary nail noted in the visualized distal femoral metaphysis. IMPRESSION: No acute or subacute osseous abnormality. Mild osseous demineralization. Electronically Signed   By: Hulan Saas M.D.   On: 10/27/2019 18:18   DG Knee Complete 4 Views Right  Result Date: 10/28/2019 CLINICAL DATA:  Right joint effusion EXAM: RIGHT KNEE - COMPLETE 4+ VIEW COMPARISON:  None. FINDINGS: Moderate right joint effusion. No acute bony abnormality. Specifically, no fracture, subluxation, or dislocation. Joint spaces are maintained. Soft tissues are intact. IMPRESSION: Moderate right joint effusion.  No acute bony abnormality. Electronically Signed   By: Charlett Nose M.D.   On: 10/28/2019 10:08         Scheduled Meds: . aspirin EC  325 mg Oral Q breakfast  . cholecalciferol  2,000 Units Oral Daily  . diclofenac  1 patch Transdermal BID  . ferrous sulfate  325 mg Oral TID PC  . magnesium oxide  400 mg Oral Daily  . metoprolol succinate  12.5 mg Oral Daily  . pantoprazole  40 mg Oral Daily  . trazodone  300 mg Oral QHS  . zinc sulfate  220 mg Oral Daily   Continuous Infusions: . sodium chloride 10 mL/hr at 10/24/19 1825  . [START ON 10/29/2019] cefTRIAXone (ROCEPHIN)  IV    . [START ON 10/29/2019] vancomycin    . vancomycin       LOS: 10 days    Time spent: over 30 min    Lacretia Nicks, MD [facility]   To contact the attending provider between 7A-7P or the covering provider during after hours 7P-7A, please log into the web site [email or web address] and access using universal [place] password for that web site. If you do not have the password, please call the hospital operator.  10/28/2019, 4:24 PM

## 2019-10-28 NOTE — TOC Progression Note (Signed)
Transition of Care [facility]) - Progression Note    Patient Details  Name: Leslie Dean MRN: [id number] Date of Birth: Nov 01, 1945  [facility] CM/SW Contact  Amada Jupiter, LCSW Phone Number: 10/28/2019, 2:57 PM  Clinical Narrative:    Alerted by MD that we need to hold pt again today to await cultures form knee aspiration earlier today.  Have informed [facility] and pt/ son.  Hope to transfer tomorrow.   Expected Discharge Plan: Skilled Nursing Facility Barriers to Discharge: SNF Pending bed offer  Expected Discharge Plan and Services Expected Discharge Plan: Skilled Nursing Facility   Discharge Planning Services: CM Consult Post Acute Care Choice: Durable Medical Equipment, Home Health Living arrangements for the past 2 months: Single Family Home                                       Social Determinants of Health (SDOH) Interventions    Readmission Risk Interventions No flowsheet data found.

## 2019-10-28 NOTE — Care Management Important Message (Signed)
Important Message  Patient Details IM Letter given to Amada Jupiter SW Case Manager to present to the Patient Name: NOLIE BIGNELL MRN: [id number] Date of Birth: Oct 02, 1945   Medicare Important Message Given:  Yes     Caren Macadam 10/28/2019, 11:16 AM

## 2019-10-29 LAB — MAGNESIUM: Magnesium: 2 mg/dL (ref 1.7–2.4)

## 2019-10-29 LAB — CBC WITH DIFFERENTIAL/PLATELET
Abs Immature Granulocytes: 0.12 10*3/uL — ABNORMAL HIGH (ref 0.00–0.07)
Basophils Absolute: 0 10*3/uL (ref 0.0–0.1)
Basophils Relative: 0 %
Eosinophils Absolute: 0.1 10*3/uL (ref 0.0–0.5)
Eosinophils Relative: 1 %
HCT: 29.6 % — ABNORMAL LOW (ref 36.0–46.0)
Hemoglobin: 9 g/dL — ABNORMAL LOW (ref 12.0–15.0)
Immature Granulocytes: 1 %
Lymphocytes Relative: 20 %
Lymphs Abs: 2.2 10*3/uL (ref 0.7–4.0)
MCH: 28.8 pg (ref 26.0–34.0)
MCHC: 30.4 g/dL (ref 30.0–36.0)
MCV: 94.9 fL (ref 80.0–100.0)
Monocytes Absolute: 1.3 10*3/uL — ABNORMAL HIGH (ref 0.1–1.0)
Monocytes Relative: 12 %
Neutro Abs: 7.1 10*3/uL (ref 1.7–7.7)
Neutrophils Relative %: 66 %
Platelets: 321 10*3/uL (ref 150–400)
RBC: 3.12 MIL/uL — ABNORMAL LOW (ref 3.87–5.11)
RDW: 14.6 % (ref 11.5–15.5)
WBC: 10.9 10*3/uL — ABNORMAL HIGH (ref 4.0–10.5)
nRBC: 0 % (ref 0.0–0.2)

## 2019-10-29 LAB — COMPREHENSIVE METABOLIC PANEL
ALT: 23 U/L (ref 0–44)
AST: 12 U/L — ABNORMAL LOW (ref 15–41)
Albumin: 2.2 g/dL — ABNORMAL LOW (ref 3.5–5.0)
Alkaline Phosphatase: 93 U/L (ref 38–126)
Anion gap: 5 (ref 5–15)
BUN: 11 mg/dL (ref 8–23)
CO2: 30 mmol/L (ref 22–32)
Calcium: 9.2 mg/dL (ref 8.9–10.3)
Chloride: 103 mmol/L (ref 98–111)
Creatinine, Ser: 0.67 mg/dL (ref 0.44–1.00)
GFR calc Af Amer: >60 mL/min (ref >60)
GFR calc non Af Amer: >60 mL/min (ref >60)
Glucose, Bld: 113 mg/dL — ABNORMAL HIGH (ref 70–99)
Potassium: 4.4 mmol/L (ref 3.5–5.1)
Sodium: 138 mmol/L (ref 135–145)
Total Bilirubin: 0.8 mg/dL (ref 0.3–1.2)
Total Protein: 5.4 g/dL — ABNORMAL LOW (ref 6.5–8.1)

## 2019-10-29 LAB — PHOSPHORUS: Phosphorus: 4.1 mg/dL (ref 2.5–4.6)

## 2019-10-29 NOTE — Plan of Care (Signed)

## 2019-10-29 NOTE — Progress Notes (Signed)
PROGRESS NOTE    Leslie Dean  BDZ:[id number] DOB: Apr 22, 1946 DOA: 10/18/2019 PCP: Patient, No Pcp Per   Chief Complaint  Patient presents with  . Fall  . 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[employer]) PMHx Marfan syndrome,Chronic back pain with some scoliosis and on multiple meds previously not on any now,HTN Prior admission large hepatic cyst 12/06/2012-(was benign)   Accidental fall-immediate left hip pain 10/10-Rx NAD 200 mcg fentanyl 1 mg Dilaudid 4 mg morphine = hypoxia in ED  EKG on admit RBBB LPF B Labs pretty normal other than white count 19 on admission  Assessment & Plan:   Principal Problem:   Closed left hip fracture (HCC) Active Problems:   HTN (hypertension)   Marfans syndrome   Acute respiratory failure (HCC)   Acute lower UTI  LEFT proximal femur fracture -S/p 5/10 open reduction, internal fixation. See procedure below  -CIR has evaluated patient and declined admission. -5/13 patient counseled at length that she must participate with PT/OT if she is to prevent sequela of not using her new joint; i.e. scar tissue buildup causing patient not to be able to use left hip possibly leading to her being wheelchair-bound. Daughter present -5/16 patient performing physical therapy exercises aided by her children - ASA 81 mg BID for dvt ppx - she prefers to not use any opiates - PWB 50% on the L leg - Left shin with some swelling, looks like hematoma, will follow tib/fib imaging - without acute abnormality  Right Knee Effusion: tender, improved after  40 cc cloudy fluid aspirated by ortho Unfortunately unable to perform WBC count, but 94% neutrophil and turbid appearance - no crystals Neutrophil predominance and turbid appearance would be concerning for infection -> follow culture -no growth < 12 hrs -> if no growth in 24 hrs will discharge to SNF Gram stain without organism Continue ceftriaxone, add vanc Appreciate ortho  recs  Pain management -Patient has been resistant to working with PT/OT -5/13 Norco 7.5-325 mg(hold) -5/13 Robaxin 500 mg (hold) -5/13 morphine discontinued -5/13 Percocet 7.5--325 mg discontinue -5/13 TylenolPRN -5/13 tramadol 50 mgPRN  Chest/abdominal pain -Most likely secondary to malpositioning of patient; previous fracture clavicle LEFT shoulder -Repeat EKG showed PVCs. No significant change from prior EKG in 2014  Respiratory failure with hypoxia -Most likely iatrogenic secondary to extensive amount of narcotics. -See pain management  Hypotension on admission -5/16 normal saline KVO -Resolved  Marfan syndrome, scoliosis -See pain management   LEFT fourth fractured rib -Most likely secondary to patient's fall -See pain management -Incentive spirometry  Leukocytosis  Serratia Marcescens and E. Coli UTI: No clear sx, but with elevated WBC, will treat  Goals of care -5/14 Reconsult inpatient Rehab patient now more alert and interactive.  Patient should be Leslie Dean good candidate for CIR. -5/18 had Leslie Dean long discussion with patient, who now understands CIR has declined admission.  She also understands that her children are challenging this ruling.  She plans on speaking with her children tonight, and may agree to SNF or home health (do not believe she would do well) in the Leslie Dean.m. -5/19 plan for SNF in AM  DVT prophylaxis: aspirin  Code Status: full Family Communication: son, daughter Disposition:   Status is: Inpatient  Remains inpatient appropriate because:Inpatient level of care appropriate due to severity of illness   Dispo: The patient is from: SNF              Anticipated d/c is to:  SNF              Anticipated d/c date is: 1 day              Patient currently is not medically stable to d/c.   Consultants:   orthopedics  Procedures:  5/11  Left proximal femur, reverse obliquity intertrochanteric femur fracture.   Antimicrobials:  Anti-infectives  (From admission, onward)   Start     Dose/Rate Route Frequency Ordered Stop   10/29/19 0800  cefTRIAXone (ROCEPHIN) 2 g in sodium chloride 0.9 % 100 mL IVPB     2 g 200 mL/hr over 30 Minutes Intravenous Every 24 hours 10/28/19 1432     10/29/19 0600  vancomycin (VANCOCIN) IVPB 1000 mg/200 mL premix     1,000 mg 200 mL/hr over 60 Minutes Intravenous Every 12 hours 10/28/19 1438     10/28/19 1500  vancomycin (VANCOREADY) IVPB 1750 mg/350 mL     1,750 mg 175 mL/hr over 120 Minutes Intravenous  Once 10/28/19 1417 10/28/19 1915   10/27/19 1300  cefTRIAXone (ROCEPHIN) 1 g in sodium chloride 0.9 % 100 mL IVPB  Status:  Discontinued     1 g 200 mL/hr over 30 Minutes Intravenous Every 24 hours 10/27/19 1229 10/28/19 1432   10/19/19 1000  cefTRIAXone (ROCEPHIN) 1 g in sodium chloride 0.9 % 100 mL IVPB  Status:  Discontinued     1 g 200 mL/hr over 30 Minutes Intravenous Every 24 hours 10/18/19 1507 10/19/19 1007   10/19/19 0600  ceFAZolin (ANCEF) IVPB 2g/100 mL premix  Status:  Discontinued     2 g 200 mL/hr over 30 Minutes Intravenous On call to O.R. 10/18/19 1603 10/18/19 1817   10/19/19 0400  ceFAZolin (ANCEF) IVPB 2g/100 mL premix     2 g 200 mL/hr over 30 Minutes Intravenous Every 6 hours 10/18/19 2324 10/19/19 1043   10/18/19 2047  ceFAZolin (ANCEF) 2-4 GM/100ML-% IVPB    Note to Pharmacy: Leslie Dean   : cabinet override      10/18/19 2047 10/19/19 0859   10/18/19 0945  cefTRIAXone (ROCEPHIN) 1 g in sodium chloride 0.9 % 100 mL IVPB     1 g 200 mL/hr over 30 Minutes Intravenous  Once 10/18/19 0925 10/18/19 1111     Subjective: R knee pain/swelling better  Objective: Vitals:   10/29/19 1020 10/29/19 1021 10/29/19 1220 10/29/19 1422  BP: (!) 100/53 (!) 100/53 104/61 (!) 97/57  Pulse: 82 82 63 67  Resp:  16 16 16   Temp:    98.1 F (36.7 C)  TempSrc:      SpO2:  93% 93% 91%  Weight:      Height:        Intake/Output Summary (Last 24 hours) at 10/29/2019 1528 Last data  filed at 10/29/2019 1000 Gross per 24 hour  Intake 1090.69 ml  Output 1375 ml  Net -284.31 ml   Filed Weights   10/18/19 1700  Weight: 88.9 kg    Examination:  General: No acute distress. Cardiovascular: Heart sounds show Leslie Dean regular rate, and rhythm.  Lungs: Clear to auscultation bilaterally Abdomen: Soft, nontender, nondistended  Neurological: Alert and oriented 3. Moves all extremities 4. Cranial nerves II through XII grossly intact. Skin: Warm and dry. No rashes or lesions. Extremities: R knee swelling, improved since yesterday, LLE with intact dressing     Data Reviewed: I have personally reviewed following labs and imaging studies  CBC: Recent Labs  Lab 10/23/19 0418 10/23/19 0418  10/24/19 0413 10/25/19 0315 10/27/19 0548 10/28/19 0412 10/29/19 0315  WBC 11.5*   < > 11.6* 12.6* 15.0* 12.6* 10.9*  NEUTROABS 8.2*  --  7.9* 9.4*  --  8.0* 7.1  HGB 10.2*   < > 10.2* 10.2* 9.6* 9.6* 9.0*  HCT 32.1*   < > 32.5* 32.9* 31.0* 31.4* 29.6*  MCV 93.3   < > 93.4 94.8 94.5 95.2 94.9  PLT 176   < > 204 234 333 309 321   < > = values in this interval not displayed.    Basic Metabolic Panel: Recent Labs  Lab 10/24/19 0413 10/24/19 0413 10/25/19 0315 10/26/19 0329 10/27/19 0548 10/28/19 0412 10/29/19 0315  NA 142  --  139  --  137 136 138  K 3.8  --  3.9  --  4.4 4.1 4.4  CL 106  --  104  --  99 100 103  CO2 29  --  26  --  29 28 30   GLUCOSE 111*  --  113*  --  113* 102* 113*  BUN 16  --  13  --  14 13 11   CREATININE 0.69  --  0.60  --  0.71 0.66 0.67  CALCIUM 9.1  --  8.8*  --  9.2 8.9 9.2  MG 2.2   < > 2.1 2.1 2.1 2.0 2.0  PHOS 3.6   < > 3.5 4.2 4.1 3.6 4.1   < > = values in this interval not displayed.    GFR: Estimated Creatinine Clearance: 71.7 mL/min (by C-G formula based on SCr of 0.67 mg/dL).  Liver Function Tests: Recent Labs  Lab 10/23/19 0418 10/24/19 0413 10/25/19 0315 10/28/19 0412 10/29/19 0315  AST 24 28 32 16 12*  ALT 25 35 42 25 23   ALKPHOS 71 85 95 92 93  BILITOT 1.3* 1.4* 1.1 1.1 0.8  PROT 5.5* 5.5* 5.4* 5.6* 5.4*  ALBUMIN 2.3* 2.4* 2.3* 2.3* 2.2*    CBG: No results for input(s): GLUCAP in the last 168 hours.   Recent Results (from the past 240 hour(s))  Surgical PCR screen     Status: Abnormal   Collection Time: 10/20/19  4:57 AM   Specimen: Nasal Mucosa; Nasal Swab  Result Value Ref Range Status   MRSA, PCR NEGATIVE NEGATIVE Final   Staphylococcus aureus POSITIVE (Leslie Dean) NEGATIVE Final    Comment: (NOTE) The Xpert SA Assay (FDA approved for NASAL specimens in patients 74 years of age and older), is one component of Leslie Dean comprehensive surveillance program. It is not intended to diagnose infection nor to guide or monitor treatment. Performed at [facility], [address] [place], [place] [address]   SARS CORONAVIRUS 2 (TAT 6-24 HRS) Nasopharyngeal Nasopharyngeal Swab     Status: None   Collection Time: 10/26/19  1:30 AM   Specimen: Nasopharyngeal Swab  Result Value Ref Range Status   SARS Coronavirus 2 NEGATIVE NEGATIVE Final    Comment: (NOTE) SARS-CoV-2 target nucleic acids are NOT DETECTED. The SARS-CoV-2 RNA is generally detectable in upper and lower respiratory specimens during the acute phase of infection. Negative results do not preclude SARS-CoV-2 infection, do not rule out co-infections with other pathogens, and should not be used as the sole basis for treatment or other patient management decisions. Negative results must be combined with clinical observations, patient history, and epidemiological information. The expected result is Negative. Fact Sheet for Patients: HairSlick.nohttps://www.fda.gov/media/138098/download Fact Sheet for Healthcare Providers: quierodirigir.comhttps://www.fda.gov/media/138095/download This test is not yet  approved or cleared by the [place] and  has been authorized for detection and/or diagnosis of SARS-CoV-2 by FDA under an Emergency Use Authorization (EUA).  This EUA will remain  in effect (meaning this test can be used) for the duration of the COVID-19 declaration under Section 56 4(b)(1) of the Act, 21 U.S.C. section 360bbb-3(b)(1), unless the authorization is terminated or revoked sooner. Performed at [facility], [address], [place], [place] [address]   Stat Gram stain     Status: None   Collection Time: 10/28/19 12:30 PM   Specimen: Joint, Knee; Body Fluid  Result Value Ref Range Status   Specimen Description JOINT, KNEE  Final   Special Requests NONE  Final   Gram Stain   Final    NO ORGANISMS SEEN Gram Stain Report Called to,Read Back By and Verified With: [facility] RN AT 1310 ON 10/28/19 BY S.VANHOORNE Performed at [facility], [address] [place], [place] [address]    Report Status 10/28/2019 FINAL  Final  Body fluid culture     Status: None (Preliminary result)   Collection Time: 10/28/19 12:30 PM   Specimen: KNEE; Body Fluid  Result Value Ref Range Status   Specimen Description   Final    KNEE RIGHT Performed at [facility], [address] [place], [place] [address]    Special Requests   Final    NONE Performed at [facility], [address] [place], [place] [address]    Gram Stain   Final    MODERATE WBC PRESENT, PREDOMINANTLY PMN NO ORGANISMS SEEN    Culture   Final    NO GROWTH < 12 HOURS Performed at [facility], [address] [place], [place] [address]    Report Status PENDING  Incomplete         Radiology Studies: DG Tibia/Fibula Left  Result Date: 10/27/2019 CLINICAL DATA:  Persistent LEFT lower leg pain which the patient temporally relates to her LEFT femoral neck ORIF 9 days ago. No known injury. EXAM: LEFT TIBIA AND FIBULA - 2 VIEW COMPARISON:  None. FINDINGS: No evidence of acute, subacute or healed fractures. Mild osseous demineralization. No other intrinsic osseous abnormalities. Well-preserved joint spaces in  the LEFT knee and the LEFT ankle. Intramedullary nail noted in the visualized distal femoral metaphysis. IMPRESSION: No acute or subacute osseous abnormality. Mild osseous demineralization. Electronically Signed   By: Hulan Saas M.D.   On: 10/27/2019 18:18   DG Knee Complete 4 Views Right  Result Date: 10/28/2019 CLINICAL DATA:  Right joint effusion EXAM: RIGHT KNEE - COMPLETE 4+ VIEW COMPARISON:  None. FINDINGS: Moderate right joint effusion. No acute bony abnormality. Specifically, no fracture, subluxation, or dislocation. Joint spaces are maintained. Soft tissues are intact. IMPRESSION: Moderate right joint effusion.  No acute bony abnormality. Electronically Signed   By: Charlett Nose M.D.   On: 10/28/2019 10:08        Scheduled Meds: . aspirin EC  325 mg Oral Q breakfast  . cholecalciferol  2,000 Units Oral Daily  . diclofenac  1 patch Transdermal BID  . ferrous sulfate  325 mg Oral TID PC  . magnesium oxide  400 mg Oral Daily  . metoprolol succinate  12.5 mg Oral Daily  . pantoprazole  40 mg Oral Daily  . trazodone  300 mg Oral QHS  . zinc sulfate  220 mg Oral Daily   Continuous Infusions: . sodium chloride  10 mL/hr at 10/24/19 1825  . cefTRIAXone (ROCEPHIN)  IV 2 g (10/29/19 0815)  . vancomycin 200 mL/hr at 10/29/19 0600     LOS: 11 days    Time spent: over 30 min    Lacretia Nicks, MD [facility]   To contact the attending provider between 7A-7P or the covering provider during after hours 7P-7A, please log into the web site [email or web address] and access using universal [place] password for that web site. If you do not have the password, please call the hospital operator.  10/29/2019, 3:28 PM

## 2019-10-29 NOTE — Progress Notes (Signed)
Physical Therapy Treatment Patient Details Name: Leslie Dean MRN: [id number] DOB: Oct 16, 1945 Today's Date: 10/29/2019    History of Present Illness Patient is a 74 year old female with chronic back pain secondary to scoliosis, spinal stenosis, hypertension, Marfan syndrome, urinary incontinence presented to ED 10/18/19 with fall at home resulting in left hip pain. Xray-Comminuted intertrochanteric femur fracture proximally .S/P IM nailing 10/19/19    PT Comments    POD # 11  Assisted OOB to [facility] required + 2 assist and increased time. General bed mobility comments: using her belt strap and increased time, pt was able to partially transfer self to EOB 50% assist to complete. General transfer comment: assisted from elevated bed to [facility] 1/4 pivot with increased time and 75% VC's on proper sequencing and turn completion.  Allowed pt to sit on [facility] extended time per pt request.    Follow Up Recommendations  SNF;Supervision/Assistance - 24 hour     Equipment Recommendations       Recommendations for Other Services       Precautions / Restrictions Precautions Precautions: Fall Precaution Comments: pre medicate for pain and also has L 4th Rib Fx and severe scoliosis.  New onset R knee pain with Aspiration 10/28/19 Restrictions Weight Bearing Restrictions: Yes LLE Weight Bearing: Partial weight bearing LLE Partial Weight Bearing Percentage or Pounds: 50%    Mobility  Bed Mobility Overal bed mobility: Needs Assistance Bed Mobility: Supine to Sit     Supine to sit: Mod assist;+2 for safety/equipment     General bed mobility comments: using her belt strap and increased time, pt was able to partially transfer self to EOB 50% assist to complete.  Transfers Overall transfer level: Needs assistance Equipment used: Bilateral platform walker;None Transfers: Sit to/from [employer] Transfers Sit to Stand: Total assist;+2 physical assistance;+2 safety/equipment Stand pivot  transfers: Total assist;+2 physical assistance;+2 safety/equipment;From elevated surface       General transfer comment: assisted from elevated bed to [facility] 1/4 pivot with increased time and 75% VC's on proper sequencing and turn completion     Balance                                            Cognition Arousal/Alertness: Awake/alert Behavior During Therapy: WFL for tasks assessed/performed Overall Cognitive Status: Within Functional Limits for tasks assessed                                 General Comments: AxO x 4 "knee feels better today"      Exercises      General Comments        Pertinent Vitals/Pain Pain Assessment: Faces Faces Pain Scale: Hurts little more Pain Location: R knee Pain Descriptors / Indicators: Throbbing Pain Intervention(s): Monitored during session;Repositioned;Premedicated before session    Home Living                      Prior Function            PT Goals (current goals can now be found in the care plan section) Progress towards PT goals: Progressing toward goals    Frequency    Min 3X/week      PT Plan Current plan remains appropriate    Co-evaluation  AM-PAC PT "6 Clicks" Mobility   Outcome Measure  Help needed turning from your back to your side while in a flat bed without using bedrails?: Total Help needed moving from lying on your back to sitting on the side of a flat bed without using bedrails?: Total Help needed moving to and from a bed to a chair (including a wheelchair)?: Total Help needed standing up from a chair using your arms (e.g., wheelchair or bedside chair)?: Total Help needed to walk in hospital room?: Total Help needed climbing 3-5 steps with a railing? : Total 6 Click Score: 6    End of Session Equipment Utilized During Treatment: Gait belt Activity Tolerance: Patient limited by pain Patient left: in BSC;with call bell/phone within reach;with  chair alarm set;with family/visitor present Nurse Communication: Mobility status;Need for lift equipment PT Visit Diagnosis: Unsteadiness on feet (R26.81);Difficulty in walking, not elsewhere classified (R26.2)     Time: 1200- 1225    Charges:  $Therapeutic Activity: 23-37 mins                     Rica Koyanagi  PTA Acute  Rehabilitation Services Pager      [phone] Office      [phone]

## 2019-10-29 NOTE — Progress Notes (Signed)
Physical Therapy Treatment Patient Details Name: Leslie Dean MRN: [id number] DOB: [birth year] Today's Date: 10/29/2019    History of Present Illness Patient is a 74 year old female with chronic back pain secondary to scoliosis, spinal stenosis, hypertension, Marfan syndrome, urinary incontinence presented to ED 10/18/19 with fall at home resulting in left hip pain. Xray-Comminuted intertrochanteric femur fracture proximally .S/P IM nailing 10/19/19    PT Comments    POD # 11 Assisted off BSC required + 2 side by side assist and increased time. Pt does best with rock forward on count of three.  Severe posterior lean and inability to self rise.  Required assist to place B forearms on platform of EVA walker.  One assist fro balance and other to perform peri care after void.  Very limited stance time to due R knee pain and L hip PWB.  BSC switched with recliner from behind.  Pt was unable to take any functional steps this session.  Positioned in recliner to comfort.   Follow Up Recommendations  SNF;Supervision/Assistance - 24 hour     Equipment Recommendations       Recommendations for Other Services       Precautions / Restrictions Precautions Precautions: Fall Precaution Comments: pre medicate for pain and also has L 4th Rib Fx and severe scoliosis.  New onset R knee pain with Aspiration 10/28/19 Restrictions Weight Bearing Restrictions: Yes LLE Weight Bearing: Partial weight bearing LLE Partial Weight Bearing Percentage or Pounds: 50%    Mobility  Bed Mobility OOB on BSC  Transfers Overall transfer level: Needs assistance Equipment used: Bilateral platform walker;None Transfers: Sit to/from [employer] Transfers Sit to Stand: Total assist;+2 physical assistance;+2 safety/equipment Stand pivot transfers: Total assist;+2 physical assistance;+2 safety/equipment;From elevated surface       General transfer comment: assisted off BSC + 2 side by side Total Assist pt <  15%.   Ambulation/Gait             General Gait Details: pt unable to take any steps this session due to R knee pain and L hip pain(PWB)   Stairs             Wheelchair Mobility    Modified Rankin (Stroke Patients Only)       Balance                                            Cognition Arousal/Alertness: Awake/alert Behavior During Therapy: WFL for tasks assessed/performed Overall Cognitive Status: Within Functional Limits for tasks assessed                                 General Comments: AxO x 4 "knee feels better today"      Exercises      General Comments        Pertinent Vitals/Pain Pain Assessment: Faces Faces Pain Scale: Hurts little more Pain Location: R knee Pain Descriptors / Indicators: Throbbing Pain Intervention(s): Monitored during session;Repositioned;Premedicated before session    Home Living                      Prior Function            PT Goals (current goals can now be found in the care plan section) Progress towards PT goals: Progressing toward goals  Frequency    Min 3X/week      PT Plan Current plan remains appropriate    Co-evaluation              AM-PAC PT "6 Clicks" Mobility   Outcome Measure  Help needed turning from your back to your side while in a flat bed without using bedrails?: Total Help needed moving from lying on your back to sitting on the side of a flat bed without using bedrails?: Total Help needed moving to and from a bed to a chair (including a wheelchair)?: Total Help needed standing up from a chair using your arms (e.g., wheelchair or bedside chair)?: Total Help needed to walk in hospital room?: Total Help needed climbing 3-5 steps with a railing? : Total 6 Click Score: 6    End of Session Equipment Utilized During Treatment: Gait belt Activity Tolerance: Patient limited by pain Patient left: in chair;with call bell/phone within  reach;with chair alarm set;with family/visitor present Nurse Communication: Mobility status;Need for lift equipment PT Visit Diagnosis: Unsteadiness on feet (R26.81);Difficulty in walking, not elsewhere classified (R26.2)     Time: 8185-6314 PT Time Calculation (min) (ACUTE ONLY): 18 min  Charges:  $Gait Training: 8-22 mins                     Felecia Shelling  PTA Acute  Rehabilitation Services Pager      [phone] Office      [phone]

## 2019-10-29 NOTE — Progress Notes (Signed)
     Subjective: 11 Days Post-Op Procedure(s) (LRB): INTRAMEDULLARY (IM) NAIL FEMORAL (Left)   Patient reports pain as mild, pain controlled. Feels that the right knee feels much better after the aspiration yesterday. No reported events throughout the night.  Discussed the left hip and right knee   Objective:   VITALS:   Vitals:   10/28/19 2310 10/29/19 0600  BP:  109/72  Pulse: 76 73  Resp: 20 15  Temp:  98 F (36.7 C)  SpO2: 92% 97%    Dorsiflexion/Plantar flexion intact Incision: dressing C/D/I No cellulitis present Compartment soft  LABS Recent Labs    10/27/19 0548 10/28/19 0412 10/29/19 0315  HGB 9.6* 9.6* 9.0*  HCT 31.0* 31.4* 29.6*  WBC 15.0* 12.6* 10.9*  PLT 333 309 321    Recent Labs    10/27/19 0548 10/28/19 0412 10/29/19 0315  NA 137 136 138  K 4.4 4.1 4.4  BUN 14 13 11   CREATININE 0.71 0.66 0.67  GLUCOSE 113* 102* 113*     Assessment/Plan: 11 Days Post-Op Procedure(s) (LRB): INTRAMEDULLARY (IM) NAIL FEMORAL (Left)   Up with therapy - states that right knee is feeling much better after the aspiration yesterday Discharge to SNF once ready medically Hard to determine right knee infection without a cell count Nothing found on gram stain, so would have to see if cultures grow anything. Mentioned being able to treat with Toradol or prednisone to help with inflammation and pain       PA-C  [facility]  [facility] [address], [place], [place] Phone: [phone] [email or web address] Facebook  [phone]

## 2019-10-30 DIAGNOSIS — L8990 Pressure ulcer of unspecified site, unspecified stage: Secondary | ICD-10-CM | POA: Insufficient documentation

## 2019-10-30 DIAGNOSIS — B372 Candidiasis of skin and nail: Secondary | ICD-10-CM | POA: Diagnosis not present

## 2019-10-30 DIAGNOSIS — L89620 Pressure ulcer of left heel, unstageable: Secondary | ICD-10-CM | POA: Diagnosis not present

## 2019-10-30 DIAGNOSIS — J9600 Acute respiratory failure, unspecified whether with hypoxia or hypercapnia: Secondary | ICD-10-CM | POA: Diagnosis not present

## 2019-10-30 DIAGNOSIS — Q8740 Marfan's syndrome, unspecified: Secondary | ICD-10-CM | POA: Diagnosis not present

## 2019-10-30 DIAGNOSIS — J9601 Acute respiratory failure with hypoxia: Secondary | ICD-10-CM | POA: Diagnosis not present

## 2019-10-30 DIAGNOSIS — R52 Pain, unspecified: Secondary | ICD-10-CM | POA: Diagnosis not present

## 2019-10-30 DIAGNOSIS — M84352A Stress fracture, left femur, initial encounter for fracture: Secondary | ICD-10-CM | POA: Diagnosis not present

## 2019-10-30 DIAGNOSIS — R531 Weakness: Secondary | ICD-10-CM | POA: Diagnosis not present

## 2019-10-30 DIAGNOSIS — S7292XD Unspecified fracture of left femur, subsequent encounter for closed fracture with routine healing: Secondary | ICD-10-CM | POA: Diagnosis not present

## 2019-10-30 DIAGNOSIS — S2239XA Fracture of one rib, unspecified side, initial encounter for closed fracture: Secondary | ICD-10-CM | POA: Diagnosis not present

## 2019-10-30 DIAGNOSIS — Z7401 Bed confinement status: Secondary | ICD-10-CM | POA: Diagnosis not present

## 2019-10-30 DIAGNOSIS — M2550 Pain in unspecified joint: Secondary | ICD-10-CM | POA: Diagnosis not present

## 2019-10-30 DIAGNOSIS — W19XXXA Unspecified fall, initial encounter: Secondary | ICD-10-CM | POA: Diagnosis not present

## 2019-10-30 DIAGNOSIS — D509 Iron deficiency anemia, unspecified: Secondary | ICD-10-CM | POA: Diagnosis not present

## 2019-10-30 DIAGNOSIS — G8929 Other chronic pain: Secondary | ICD-10-CM | POA: Diagnosis not present

## 2019-10-30 DIAGNOSIS — Z03818 Encounter for observation for suspected exposure to other biological agents ruled out: Secondary | ICD-10-CM | POA: Diagnosis not present

## 2019-10-30 DIAGNOSIS — M8000XA Age-related osteoporosis with current pathological fracture, unspecified site, initial encounter for fracture: Secondary | ICD-10-CM | POA: Diagnosis not present

## 2019-10-30 DIAGNOSIS — R2681 Unsteadiness on feet: Secondary | ICD-10-CM | POA: Diagnosis not present

## 2019-10-30 DIAGNOSIS — I10 Essential (primary) hypertension: Secondary | ICD-10-CM | POA: Diagnosis not present

## 2019-10-30 DIAGNOSIS — M6281 Muscle weakness (generalized): Secondary | ICD-10-CM | POA: Diagnosis not present

## 2019-10-30 DIAGNOSIS — S2232XD Fracture of one rib, left side, subsequent encounter for fracture with routine healing: Secondary | ICD-10-CM | POA: Diagnosis not present

## 2019-10-30 DIAGNOSIS — J201 Acute bronchitis due to Hemophilus influenzae: Secondary | ICD-10-CM | POA: Diagnosis not present

## 2019-10-30 DIAGNOSIS — S72002A Fracture of unspecified part of neck of left femur, initial encounter for closed fracture: Secondary | ICD-10-CM | POA: Diagnosis not present

## 2019-10-30 DIAGNOSIS — R252 Cramp and spasm: Secondary | ICD-10-CM | POA: Diagnosis not present

## 2019-10-30 DIAGNOSIS — K219 Gastro-esophageal reflux disease without esophagitis: Secondary | ICD-10-CM | POA: Diagnosis not present

## 2019-10-30 DIAGNOSIS — N390 Urinary tract infection, site not specified: Secondary | ICD-10-CM | POA: Diagnosis not present

## 2019-10-30 DIAGNOSIS — M25461 Effusion, right knee: Secondary | ICD-10-CM | POA: Diagnosis not present

## 2019-10-30 DIAGNOSIS — Z20828 Contact with and (suspected) exposure to other viral communicable diseases: Secondary | ICD-10-CM | POA: Diagnosis not present

## 2019-10-30 DIAGNOSIS — Z4789 Encounter for other orthopedic aftercare: Secondary | ICD-10-CM | POA: Diagnosis not present

## 2019-10-30 DIAGNOSIS — B9620 Unspecified Escherichia coli [E. coli] as the cause of diseases classified elsewhere: Secondary | ICD-10-CM | POA: Diagnosis not present

## 2019-10-30 DIAGNOSIS — S72009A Fracture of unspecified part of neck of unspecified femur, initial encounter for closed fracture: Secondary | ICD-10-CM | POA: Diagnosis not present

## 2019-10-30 DIAGNOSIS — R600 Localized edema: Secondary | ICD-10-CM | POA: Diagnosis not present

## 2019-10-30 LAB — COMPREHENSIVE METABOLIC PANEL
ALT: 22 U/L (ref 0–44)
AST: 14 U/L — ABNORMAL LOW (ref 15–41)
Albumin: 2.3 g/dL — ABNORMAL LOW (ref 3.5–5.0)
Alkaline Phosphatase: 97 U/L (ref 38–126)
Anion gap: 8 (ref 5–15)
BUN: 12 mg/dL (ref 8–23)
CO2: 30 mmol/L (ref 22–32)
Calcium: 9.5 mg/dL (ref 8.9–10.3)
Chloride: 102 mmol/L (ref 98–111)
Creatinine, Ser: 0.66 mg/dL (ref 0.44–1.00)
GFR calc Af Amer: >60 mL/min (ref >60)
GFR calc non Af Amer: >60 mL/min (ref >60)
Glucose, Bld: 103 mg/dL — ABNORMAL HIGH (ref 70–99)
Potassium: 4.5 mmol/L (ref 3.5–5.1)
Sodium: 140 mmol/L (ref 135–145)
Total Bilirubin: 0.8 mg/dL (ref 0.3–1.2)
Total Protein: 5.8 g/dL — ABNORMAL LOW (ref 6.5–8.1)

## 2019-10-30 LAB — CBC WITH DIFFERENTIAL/PLATELET
Abs Immature Granulocytes: 0.11 10*3/uL — ABNORMAL HIGH (ref 0.00–0.07)
Basophils Absolute: 0 10*3/uL (ref 0.0–0.1)
Basophils Relative: 0 %
Eosinophils Absolute: 0.2 10*3/uL (ref 0.0–0.5)
Eosinophils Relative: 2 %
HCT: 31.1 % — ABNORMAL LOW (ref 36.0–46.0)
Hemoglobin: 9.5 g/dL — ABNORMAL LOW (ref 12.0–15.0)
Immature Granulocytes: 1 %
Lymphocytes Relative: 19 %
Lymphs Abs: 2.1 10*3/uL (ref 0.7–4.0)
MCH: 29.1 pg (ref 26.0–34.0)
MCHC: 30.5 g/dL (ref 30.0–36.0)
MCV: 95.1 fL (ref 80.0–100.0)
Monocytes Absolute: 1.3 10*3/uL — ABNORMAL HIGH (ref 0.1–1.0)
Monocytes Relative: 11 %
Neutro Abs: 7.4 10*3/uL (ref 1.7–7.7)
Neutrophils Relative %: 67 %
Platelets: 361 10*3/uL (ref 150–400)
RBC: 3.27 MIL/uL — ABNORMAL LOW (ref 3.87–5.11)
RDW: 14.6 % (ref 11.5–15.5)
WBC: 11.1 10*3/uL — ABNORMAL HIGH (ref 4.0–10.5)
nRBC: 0 % (ref 0.0–0.2)

## 2019-10-30 LAB — MAGNESIUM: Magnesium: 2.2 mg/dL (ref 1.7–2.4)

## 2019-10-30 LAB — PHOSPHORUS: Phosphorus: 4 mg/dL (ref 2.5–4.6)

## 2019-10-30 MED ORDER — METOPROLOL SUCCINATE ER 25 MG PO TB24: 12.5000 mg | ORAL_TABLET | Freq: Every day | ORAL | 0 refills | Status: DC

## 2019-10-30 MED ORDER — FERROUS SULFATE 325 (65 FE) MG PO TABS: 325.0000 mg | ORAL_TABLET | Freq: Every day | ORAL | 0 refills | Status: DC

## 2019-10-30 MED ORDER — SULFAMETHOXAZOLE-TRIMETHOPRIM 800-160 MG PO TABS
1.0000 | ORAL_TABLET | Freq: Two times a day (BID) | ORAL | 0 refills | Status: AC
Start: 2019-10-31 — End: 2019-11-01

## 2019-10-30 MED ORDER — TRAMADOL HCL 50 MG PO TABS: 50.0000 mg | ORAL_TABLET | Freq: Four times a day (QID) | ORAL | 0 refills | Status: AC | PRN

## 2019-10-30 MED ORDER — PANTOPRAZOLE SODIUM 40 MG PO TBEC: 40.0000 mg | DELAYED_RELEASE_TABLET | Freq: Every day | ORAL | 0 refills | Status: DC

## 2019-10-30 NOTE — Progress Notes (Signed)
Report given to Nia RN at [facility]SNF unit. PTAR on unit for transport; patient's daughter remains at bedside.

## 2019-10-30 NOTE — Discharge Summary (Signed)
Physician Discharge Summary  Leslie Dean VVO:[id number] DOB: 18-Dec-1945 DOA: 10/18/2019  PCP: Leslie Dean  Admit date: 10/18/2019 Discharge date: 10/30/2019  Time spent: 40 minutes  Recommendations for Outpatient Follow-up:  1. Follow outpatient CBC/CMP 2. Follow right knee effusion outpatient --- Follow synovial fluid culture -> no growth to date 3. Follow osteoporosis management/treatment outpatient  4. 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  She was treated surgically by orthopedics on 5/10.  She's being discharged to SNF on 5/22.  Hospitalization complicated by right knee effusion, this is no growth to date, will need outpatient follow up.    See below for further details  Hospital Course:  LEFT proximal femur fracture -S/p 5/10 open reduction, internal fixation. See procedure below  -CIR has evaluated patient and declined admission. -5/13 patient counseled at length that she must participate with PT/OT if she is to prevent  sequela of not using her new joint; i.e. scar tissue buildup causing patient not to be able to use left hip possibly leading to her being wheelchair-bound. Daughter present -5/16 patient performing physical therapy exercises aided by her children - ASA 81 mg BID for dvt ppx - she prefers to not use any opiates, will discharge with tramadol - PWB 50% on the L leg - Left shin with some swelling, looks like hematoma, will follow tib/fib imaging - without acute abnormality  Right Knee Effusion: tender, improved after  40 cc cloudy fluid aspirated by ortho Unfortunately unable to perform WBC count, but 94% neutrophil and turbid appearance - no crystals Neutrophil predominance and turbid appearance would be concerning for infection -> follow culture -no growth x 24 hrs Gram stain without organism Recommend follow outpatient, follow final cx results, will d/c abx for this at this time Appreciate ortho recs  Pain management - d/c with tramadol, pt wants to avoid opiates  Chest/abdominal pain -Most likely secondary to malpositioning of patient; previous fracture clavicle LEFT shoulder -Repeat EKG showed PVCs. No significant change from prior EKG in 2014  Respiratory failure with hypoxia -Most likely iatrogenic secondary to extensive amount of narcotics. -See pain management  Hypotension on admission -5/16 normal saline KVO -Resolved - metoprolol and losartan on hold   Marfan syndrome, scoliosis -See pain management   LEFT fourth fractured rib -Most likely secondary to patient's fall -See pain management -Incentive spirometry  Leukocytosis  Serratia Marcescens and E. Coli UTI: No clear sx, but with elevated WBC, will treat  Goals of care -5/14 Reconsult inpatient Rehab patient now more alert and  interactive. Patient should be Leslie Dean good candidate for CIR. -5/18 previous providerhad Leslie Dean long discussion with patient, who now understands CIR has declined admission. She also  understands that her children are challenging this ruling. She plans on speaking with her children tonight, and may agree to SNF or home health (do not believe she would do well) in the [name].m. -5/19 plan for SNF  Procedures: 5/10 PROCEDURE:  Open reduction internal fixation of left proximal femur fracture utilizing Leslie Dean [employer] long nail, 11 mm x 380 mm with Leslie Dean 115 mm lag screw and Leslie Dean distal interlock. R knee arthrocentesis 5/20  Consultations:  orthopedics  Discharge Exam: Vitals:   10/30/19 0440 10/30/19 0534  BP:  117/63  Pulse:  (!) 56  Resp:  18  Temp:  98.3 F (36.8 C)  SpO2: 94% 92%   No complaints Son at bedside Comfortable with d/c to SNF  General: No acute distress. Cardiovascular: Heart sounds show [name] regular rate, and rhythm. Lungs: Clear to auscultation Abdomen: Soft, nontender, nondistended Neurological: Alert and oriented 3. Moves all extremities 4 Cranial nerves II through XII grossly intact. Skin: Warm and dry. No rashes or lesions. Extremities: R knee effusion improved, pain mild, LLE with intact dressing   Discharge Instructions   Discharge Instructions    Call MD for:  difficulty breathing, headache or visual disturbances   Complete by: As directed    Call MD for:  extreme fatigue   Complete by: As directed    Call MD for:  hives   Complete by: As directed    Call MD for:  persistant dizziness or light-headedness   Complete by: As directed    Call MD for:  persistant nausea and vomiting   Complete by: As directed    Call MD for:  redness, tenderness, or signs of infection (pain, swelling, redness, odor or green/yellow discharge around incision site)   Complete by: As directed    Call MD for:  severe uncontrolled pain   Complete by: As directed    Call MD for:  temperature >100.4   Complete by: As directed    Diet - low sodium heart healthy   Complete by: As directed    Discharge instructions   Complete by: As directed    You were  seen for Leslie Dean left proximal femur fracture.  This was surgically repaired by orthopedics.  You will continue aspirin twice daily for prevention of blood clots.  You will be partial weight bearing to the left leg.  We'll order tramadol for pain.  You'll need outpatient follow up for management and treatment of your osteoporosis.  You had Chenel Wernli right knee effusion.  The culture has not grown anything.  This needs to be followed outpatient.  You have 1 more day of antibiotics for Atwell Mcdanel urinary tract infection.  Your blood pressures have been on the lower side here.  We're holding your losartan and we've stopped your metoprolol.  Please follow this outpatient.   Return for new, recurrent, or worsening symptoms.  Please ask your PCP to request records from this hospitalization so they know what was done and what the next steps will be.   Increase activity slowly   Complete by: As directed    Partial weight bearing   Complete by: As directed    % Body Weight: 50   Laterality: left   Extremity: Lower     Allergies as of 10/30/2019      Reactions   Other  Says narcotics cause nausea for her      Medication List    STOP taking these medications   amoxicillin-clavulanate 875-125 MG tablet Commonly known as: Augmentin   aspirin 81 MG tablet Replaced by: aspirin 81 MG chewable tablet   HYDROcodone-acetaminophen 5-325 MG tablet Commonly known as: NORCO/VICODIN   losartan 50 MG tablet Commonly known as: COZAAR   metoprolol succinate 50 MG 24 hr tablet Commonly known as: TOPROL-XL     TAKE these medications   aspirin 81 MG chewable tablet Commonly known as: Aspirin Childrens Chew 1 tablet (81 mg total) by mouth 2 (two) times daily. Take for 4 weeks, then resume regular dose. Replaces: aspirin 81 MG tablet   ferrous sulfate 325 (65 FE) MG tablet Take 1 tablet (325 mg total) by mouth daily with breakfast.   furosemide 20 MG tablet Commonly known as: LASIX Take 20 mg by mouth daily.    Magnesium 500 MG Caps Take 500 mg by mouth daily.   pantoprazole 40 MG tablet Commonly known as: PROTONIX Take 1 tablet (40 mg total) by mouth daily.   sulfamethoxazole-trimethoprim 800-160 MG tablet Commonly known as: BACTRIM DS Take 1 tablet by mouth 2 (two) times daily for 1 day. Start taking on: Oct 31, 2019   traMADol 50 MG tablet Commonly known as: ULTRAM Take 1 tablet (50 mg total) by mouth every 6 (six) hours as needed for up to 5 days for moderate pain.   trazodone 300 MG tablet Commonly known as: DESYREL Take 300 mg by mouth at bedtime.   Vitamin D3 50 MCG (2000 UT) capsule Take 2,000 Units by mouth daily.   zinc gluconate 50 MG tablet Take 50 mg by mouth daily.            Discharge Care Instructions  (From admission, onward)         Start     Ordered   10/20/19 0000  Partial weight bearing    Question Answer Comment  % Body Weight 50   Laterality left   Extremity Lower      10/20/19 0957         Allergies  Allergen Reactions  . Other     Says narcotics cause nausea for her   Contact information for after-discharge care    Destination    [facility] Preferred SNF .   Service: Skilled Nursing Contact information: [address] [place] [address] [phone]               The results of significant diagnostics from this hospitalization (including imaging, microbiology, ancillary and laboratory) are listed below for reference.    Significant Diagnostic Studies: DG Chest 2 View  Result Date: 10/21/2019 CLINICAL DATA:  Recent hip fixation EXAM: CHEST - 2 VIEW COMPARISON:  10/18/2019 FINDINGS: Cardiac shadow is enlarged but stable. The lungs are well aerated bilaterally. No focal infiltrate or sizable effusion is noted. Chronic bronchitic changes are again seen and stable. Aortic calcifications are noted. Mild pleural thickening is noted on the left new from the prior exam. This may be related to  mild patient rotation. Some suggestion of Sarim Rothman left fourth rib fracture laterally is noted. No pneumothorax is seen. IMPRESSION: Stable bronchitic markings. Findings suggestive of left fourth rib fracture with some associated pleural thickening. No pneumothorax is noted. Electronically Signed   By: Alcide Clever M.D.   On: 10/21/2019 15:45   DG Lumbar Spine 2-3 Views  Result Date: 10/18/2019 CLINICAL DATA:  Pain  following fall EXAM: LUMBAR SPINE - 2-3 VIEW COMPARISON:  None. FINDINGS: Note that lateral images somewhat limited due to cross-table lateral positioning. Frontal, lateral, and spot lumbosacral lateral images were obtained. There are 5 non-rib-bearing lumbar type vertebral bodies. Bones are osteoporotic. There is marked levorotoscoliosis in the mid lumbar spine. There is bony remodeling due to the chronic scoliosis but no well-defined acute fracture. No appreciable spondylolisthesis. There is moderate disc space narrowing at all levels, exacerbated by scoliosis. There is aortic atherosclerosis. IMPRESSION: Osteoporosis. Marked scoliosis with bony remodeling throughout the lumbar region. No acute fracture or or spondylolisthesis with limitations due to the cross-table lateral imaging of the lumbar spine on lateral view as well as scoliosis. Moderate osteoarthritic change throughout the lumbar region. Aortic Atherosclerosis (ICD10-I70.0). Electronically Signed   By: Bretta Bang III M.D.   On: 10/18/2019 08:24   DG Tibia/Fibula Left  Result Date: 10/27/2019 CLINICAL DATA:  Persistent LEFT lower leg pain which the patient temporally relates to her LEFT femoral neck ORIF 9 days ago. No known injury. EXAM: LEFT TIBIA AND FIBULA - 2 VIEW COMPARISON:  None. FINDINGS: No evidence of acute, subacute or healed fractures. Mild osseous demineralization. No other intrinsic osseous abnormalities. Well-preserved joint spaces in the LEFT knee and the LEFT ankle. Intramedullary nail noted in the visualized distal  femoral metaphysis. IMPRESSION: No acute or subacute osseous abnormality. Mild osseous demineralization. Electronically Signed   By: Hulan Saas M.D.   On: 10/27/2019 18:18   DG Chest Port 1 View  Result Date: 10/18/2019 CLINICAL DATA:  Preop left hip fracture EXAM: PORTABLE CHEST 1 VIEW COMPARISON:  None. FINDINGS: There are bilateral chronic bronchitic changes. There is no focal consolidation. There is no pleural effusion or pneumothorax. The heart and mediastinal contours are unremarkable. There is no acute osseous abnormality. IMPRESSION: No active disease. Electronically Signed   By: Elige Ko   On: 10/18/2019 11:07   DG Knee Complete 4 Views Right  Result Date: 10/28/2019 CLINICAL DATA:  Right joint effusion EXAM: RIGHT KNEE - COMPLETE 4+ VIEW COMPARISON:  None. FINDINGS: Moderate right joint effusion. No acute bony abnormality. Specifically, no fracture, subluxation, or dislocation. Joint spaces are maintained. Soft tissues are intact. IMPRESSION: Moderate right joint effusion.  No acute bony abnormality. Electronically Signed   By: Charlett Nose M.D.   On: 10/28/2019 10:08   DG C-Arm 1-60 Min-No Report  Result Date: 10/18/2019 Fluoroscopy was utilized by the requesting physician.  No radiographic interpretation.   DG Hip Unilat W or Wo Pelvis 2-3 Views Left  Result Date: 10/18/2019 CLINICAL DATA:  Pain following fall EXAM: DG HIP (WITH OR WITHOUT PELVIS) 2-3V LEFT COMPARISON:  None. FINDINGS: Frontal pelvis as well as frontal and lateral left hip images were obtained. There is Alante Tolan comminuted intertrochanteric femur fracture on the left with varus angulation at the fracture site. There is impaction of the proximal femoral shaft into the greater trochanter. No other fractures are evident. No dislocation. Bones are diffusely osteoporotic. There is severe narrowing of each hip joint. There is an apparent pessary type device in the lower pelvic region. IMPRESSION: Comminuted intertrochanteric  femur fracture on the left with impaction at the fracture site and varus angulation. Severe narrowing of each hip joint noted. No dislocation. Underlying osteoporosis. Electronically Signed   By: Bretta Bang III M.D.   On: 10/18/2019 08:21   DG FEMUR MIN 2 VIEWS LEFT  Result Date: 10/18/2019 CLINICAL DATA:  Left femoral fracture EXAM: LEFT FEMUR 2 VIEWS  COMPARISON:  10/18/2019 FLUOROSCOPY TIME:  Radiation Exposure Index (as provided by the fluoroscopic device): 21.1 mGy If the device does not provide the exposure index: Fluoroscopy Time:  1 minutes 24 seconds Number of Acquired Images:  3 FINDINGS: Medullary rod is noted in the proximal femur with fixation screw traversing the femoral neck. Fracture fragments are in near anatomic alignment. IMPRESSION: ORIF of left femoral fracture. Electronically Signed   By: Alcide Clever M.D.   On: 10/18/2019 22:55   DG Femur Min 2 Views Left  Result Date: 10/18/2019 CLINICAL DATA:  Pain following fall EXAM: LEFT FEMUR 2 VIEWS COMPARISON:  Pelvis and left hip radiographs Oct 18, 2019 FINDINGS: Frontal and lateral views obtained. Comminuted fracture of the intertrochanteric left femur described in pelvis and left hip report. More distally, no fracture or dislocation. No knee joint effusion. Moderate narrowing medially in the knee region. Marked narrowing left hip region. IMPRESSION: Comminuted intertrochanteric femur fracture proximally with varus angulation at the fracture site. More distally, no evident fracture. No dislocation. There is osteoarthritic change in the left hip and knee joint regions. Electronically Signed   By: Bretta Bang III M.D.   On: 10/18/2019 08:25    Microbiology: Recent Results (from the past 240 hour(s))  SARS CORONAVIRUS 2 (TAT 6-24 HRS) Nasopharyngeal Nasopharyngeal Swab     Status: None   Collection Time: 10/26/19  1:30 AM   Specimen: Nasopharyngeal Swab  Result Value Ref Range Status   SARS Coronavirus 2 NEGATIVE NEGATIVE  Final    Comment: (NOTE) SARS-CoV-2 target nucleic acids are NOT DETECTED. The SARS-CoV-2 RNA is generally detectable in upper and lower respiratory specimens during the acute phase of infection. Negative results do not preclude SARS-CoV-2 infection, do not rule out co-infections with other pathogens, and should not be used as the sole basis for treatment or other patient management decisions. Negative results must be combined with clinical observations, patient history, and epidemiological information. The expected result is Negative. Fact Sheet for Patients: HairSlick.no Fact Sheet for Healthcare Providers: quierodirigir.com This test is not yet approved or cleared by the [place] FDA and  has been authorized for detection and/or diagnosis of SARS-CoV-2 by FDA under an Emergency Use Authorization (EUA). This EUA will remain  in effect (meaning this test can be used) for the duration of the COVID-19 declaration under Section 56 4(b)(1) of the Act, 21 U.S.C. section 360bbb-3(b)(1), unless the authorization is terminated or revoked sooner. Performed at [facility], [address], [place], [place] [address]   Stat Gram stain     Status: None   Collection Time: 10/28/19 12:30 PM   Specimen: Joint, Knee; Body Fluid  Result Value Ref Range Status   Specimen Description JOINT, KNEE  Final   Special Requests NONE  Final   Gram Stain   Final    NO ORGANISMS SEEN Gram Stain Report Called to,Read Back By and Verified With: [facility] RN AT 1310 ON 10/28/19 BY S.VANHOORNE Performed at [facility], [address], [place], [place] [address]    Report Status 10/28/2019 FINAL  Final  Body fluid culture     Status: None (Preliminary result)   Collection Time: 10/28/19 12:30 PM   Specimen: KNEE; Body Fluid  Result Value Ref Range Status   Specimen Description   Final    KNEE RIGHT Performed at [facility], [address] [place], [place] [address]    Special Requests   Final  NONE Performed at [facility], [address] [place], [place] [address]    Gram Stain   Final    MODERATE WBC PRESENT, PREDOMINANTLY PMN NO ORGANISMS SEEN    Culture   Final    NO GROWTH 1 DAY Performed at [facility], [address] [place], [place] [address]    Report Status PENDING  Incomplete     Labs: Basic Metabolic Panel: Recent Labs  Lab 10/25/19 0315 10/25/19 0315 10/26/19 0329 10/27/19 0548 10/28/19 0412 10/29/19 0315 10/30/19 0340  NA 139  --   --  137 136 138 140  K 3.9  --   --  4.4 4.1 4.4 4.5  CL 104  --   --  99 100 103 102  CO2 26  --   --  29 28 30 30   GLUCOSE 113*  --   --  113* 102* 113* 103*  BUN 13  --   --  14 13 11 12   CREATININE 0.60  --   --  0.71 0.66 0.67 0.66  CALCIUM 8.8*  --   --  9.2 8.9 9.2 9.5  MG 2.1   < > 2.1 2.1 2.0 2.0 2.2  PHOS 3.5   < > 4.2 4.1 3.6 4.1 4.0   < > = values in this interval not displayed.   Liver Function Tests: Recent Labs  Lab 10/24/19 0413 10/25/19 0315 10/28/19 0412 10/29/19 0315 10/30/19 0340  AST 28 32 16 12* 14*  ALT 35 42 25 23 22   ALKPHOS 85 95 92 93 97  BILITOT 1.4* 1.1 1.1 0.8 0.8  PROT 5.5* 5.4* 5.6* 5.4* 5.8*  ALBUMIN 2.4* 2.3* 2.3* 2.2* 2.3*   No results for input(s): LIPASE, AMYLASE in the last 168 hours. No results for input(s): AMMONIA in the last 168 hours. CBC: Recent Labs  Lab 10/24/19 0413 10/24/19 0413 10/25/19 0315 10/27/19 0548 10/28/19 0412 10/29/19 0315 10/30/19 0340  WBC 11.6*   < > 12.6* 15.0* 12.6* 10.9* 11.1*  NEUTROABS 7.9*  --  9.4*  --  8.0* 7.1 7.4  HGB 10.2*   < > 10.2* 9.6* 9.6* 9.0* 9.5*  HCT 32.5*   < > 32.9* 31.0* 31.4* 29.6* 31.1*  MCV 93.4   < > 94.8 94.5 95.2 94.9 95.1  PLT 204   < > 234 333 309 321 361   < > = values in this interval not displayed.   Cardiac Enzymes: No results for input(s): CKTOTAL, CKMB, CKMBINDEX,  TROPONINI in the last 168 hours. BNP: BNP (last 3 results) Recent Labs    10/18/19 1115  BNP 175.4*    ProBNP (last 3 results) No results for input(s): PROBNP in the last 8760 hours.  CBG: No results for input(s): GLUCAP in the last 168 hours.     Signed:  Lacretia Nicksaldwell Powell MD.  [facility] 10/30/2019, 10:03 AM

## 2019-10-30 NOTE — TOC Transition Note (Signed)
Transition of Care [facility] - CM/SW Discharge Note   Patient Details  Name: Leslie Dean MRN: [id number] Date of Birth: 08-11-1945  Transition of Care [facility]) CM/SW Contact:  Amada Jupiter, LCSW Phone Number: 10/30/2019, 11:13 AM   Clinical Narrative:   Pt medically cleared for d/c today.  [facility] ambulance called for pick up ~1:00.  Pt, family and nursing aware.  No further needs.    Final next level of care: Skilled Nursing Facility Barriers to Discharge: Barriers Resolved   Patient Goals and CMS Choice Patient states their goals for this hospitalization and ongoing recovery are:: "I will go home with the support of my family."      Discharge Placement              Patient [facility] bed at: [facility] Patient to be transferred to facility by: [facility] Name of family member notified: daughter, [name] Patient and family notified of of transfer: 10/30/19  Discharge Plan and Services   Discharge Planning Services: CM Consult Post Acute Care Choice: Durable Medical Equipment, Home Health                               Social Determinants of Health (SDOH) Interventions     Readmission Risk Interventions No flowsheet data found.

## 2019-11-01 LAB — BODY FLUID CULTURE: Culture: NO GROWTH

## 2019-11-03 DIAGNOSIS — G8929 Other chronic pain: Secondary | ICD-10-CM | POA: Diagnosis not present

## 2019-11-03 DIAGNOSIS — N390 Urinary tract infection, site not specified: Secondary | ICD-10-CM | POA: Diagnosis not present

## 2019-11-03 DIAGNOSIS — S72009A Fracture of unspecified part of neck of unspecified femur, initial encounter for closed fracture: Secondary | ICD-10-CM | POA: Diagnosis not present

## 2019-11-03 DIAGNOSIS — S2239XA Fracture of one rib, unspecified side, initial encounter for closed fracture: Secondary | ICD-10-CM | POA: Diagnosis not present

## 2019-11-05 DIAGNOSIS — B372 Candidiasis of skin and nail: Secondary | ICD-10-CM | POA: Diagnosis not present

## 2019-11-05 DIAGNOSIS — R252 Cramp and spasm: Secondary | ICD-10-CM | POA: Diagnosis not present

## 2019-11-05 DIAGNOSIS — I10 Essential (primary) hypertension: Secondary | ICD-10-CM | POA: Diagnosis not present

## 2019-11-05 DIAGNOSIS — K219 Gastro-esophageal reflux disease without esophagitis: Secondary | ICD-10-CM | POA: Diagnosis not present

## 2019-11-05 DIAGNOSIS — R600 Localized edema: Secondary | ICD-10-CM | POA: Diagnosis not present

## 2019-11-05 DIAGNOSIS — M84352A Stress fracture, left femur, initial encounter for fracture: Secondary | ICD-10-CM | POA: Diagnosis not present

## 2019-11-23 DIAGNOSIS — L89620 Pressure ulcer of left heel, unstageable: Secondary | ICD-10-CM | POA: Diagnosis not present

## 2019-11-25 DIAGNOSIS — Z4789 Encounter for other orthopedic aftercare: Secondary | ICD-10-CM | POA: Diagnosis not present

## 2019-11-25 DIAGNOSIS — S72002A Fracture of unspecified part of neck of left femur, initial encounter for closed fracture: Secondary | ICD-10-CM | POA: Diagnosis not present

## 2019-11-25 DIAGNOSIS — M25552 Pain in left hip: Secondary | ICD-10-CM | POA: Insufficient documentation

## 2019-11-26 DIAGNOSIS — R600 Localized edema: Secondary | ICD-10-CM | POA: Diagnosis not present

## 2019-11-26 DIAGNOSIS — M25461 Effusion, right knee: Secondary | ICD-10-CM | POA: Diagnosis not present

## 2019-11-26 DIAGNOSIS — M84352A Stress fracture, left femur, initial encounter for fracture: Secondary | ICD-10-CM | POA: Diagnosis not present

## 2019-11-26 DIAGNOSIS — K219 Gastro-esophageal reflux disease without esophagitis: Secondary | ICD-10-CM | POA: Diagnosis not present

## 2019-11-26 DIAGNOSIS — I10 Essential (primary) hypertension: Secondary | ICD-10-CM | POA: Diagnosis not present

## 2019-11-26 DIAGNOSIS — R252 Cramp and spasm: Secondary | ICD-10-CM | POA: Diagnosis not present

## 2019-11-29 DIAGNOSIS — L89620 Pressure ulcer of left heel, unstageable: Secondary | ICD-10-CM | POA: Diagnosis not present

## 2019-12-02 DIAGNOSIS — Q8740 Marfan's syndrome, unspecified: Secondary | ICD-10-CM | POA: Diagnosis not present

## 2019-12-02 DIAGNOSIS — M419 Scoliosis, unspecified: Secondary | ICD-10-CM | POA: Diagnosis not present

## 2019-12-02 DIAGNOSIS — Z9181 History of falling: Secondary | ICD-10-CM | POA: Diagnosis not present

## 2019-12-02 DIAGNOSIS — M800AXD Age-related osteoporosis with current pathological fracture, other site, subsequent encounter for fracture with routine healing: Secondary | ICD-10-CM | POA: Diagnosis not present

## 2019-12-02 DIAGNOSIS — M80052D Age-related osteoporosis with current pathological fracture, left femur, subsequent encounter for fracture with routine healing: Secondary | ICD-10-CM | POA: Diagnosis not present

## 2019-12-02 DIAGNOSIS — I10 Essential (primary) hypertension: Secondary | ICD-10-CM | POA: Diagnosis not present

## 2019-12-02 DIAGNOSIS — L89622 Pressure ulcer of left heel, stage 2: Secondary | ICD-10-CM | POA: Diagnosis not present

## 2019-12-02 DIAGNOSIS — Z7982 Long term (current) use of aspirin: Secondary | ICD-10-CM | POA: Diagnosis not present

## 2019-12-02 DIAGNOSIS — Z87440 Personal history of urinary (tract) infections: Secondary | ICD-10-CM | POA: Diagnosis not present

## 2019-12-02 DIAGNOSIS — G8929 Other chronic pain: Secondary | ICD-10-CM | POA: Diagnosis not present

## 2019-12-09 DIAGNOSIS — L89622 Pressure ulcer of left heel, stage 2: Secondary | ICD-10-CM | POA: Diagnosis not present

## 2019-12-09 DIAGNOSIS — Z9181 History of falling: Secondary | ICD-10-CM | POA: Diagnosis not present

## 2019-12-09 DIAGNOSIS — I10 Essential (primary) hypertension: Secondary | ICD-10-CM | POA: Diagnosis not present

## 2019-12-09 DIAGNOSIS — M800AXD Age-related osteoporosis with current pathological fracture, other site, subsequent encounter for fracture with routine healing: Secondary | ICD-10-CM | POA: Diagnosis not present

## 2019-12-09 DIAGNOSIS — Z87440 Personal history of urinary (tract) infections: Secondary | ICD-10-CM | POA: Diagnosis not present

## 2019-12-09 DIAGNOSIS — M80052D Age-related osteoporosis with current pathological fracture, left femur, subsequent encounter for fracture with routine healing: Secondary | ICD-10-CM | POA: Diagnosis not present

## 2019-12-09 DIAGNOSIS — Q8740 Marfan's syndrome, unspecified: Secondary | ICD-10-CM | POA: Diagnosis not present

## 2019-12-09 DIAGNOSIS — M419 Scoliosis, unspecified: Secondary | ICD-10-CM | POA: Diagnosis not present

## 2019-12-09 DIAGNOSIS — Z7982 Long term (current) use of aspirin: Secondary | ICD-10-CM | POA: Diagnosis not present

## 2019-12-09 DIAGNOSIS — G8929 Other chronic pain: Secondary | ICD-10-CM | POA: Diagnosis not present

## 2019-12-23 DIAGNOSIS — M80052D Age-related osteoporosis with current pathological fracture, left femur, subsequent encounter for fracture with routine healing: Secondary | ICD-10-CM | POA: Diagnosis not present

## 2019-12-23 DIAGNOSIS — Z87440 Personal history of urinary (tract) infections: Secondary | ICD-10-CM | POA: Diagnosis not present

## 2019-12-23 DIAGNOSIS — L89622 Pressure ulcer of left heel, stage 2: Secondary | ICD-10-CM | POA: Diagnosis not present

## 2019-12-23 DIAGNOSIS — G8929 Other chronic pain: Secondary | ICD-10-CM | POA: Diagnosis not present

## 2019-12-23 DIAGNOSIS — M800AXD Age-related osteoporosis with current pathological fracture, other site, subsequent encounter for fracture with routine healing: Secondary | ICD-10-CM | POA: Diagnosis not present

## 2019-12-23 DIAGNOSIS — Z9181 History of falling: Secondary | ICD-10-CM | POA: Diagnosis not present

## 2019-12-23 DIAGNOSIS — I10 Essential (primary) hypertension: Secondary | ICD-10-CM | POA: Diagnosis not present

## 2019-12-23 DIAGNOSIS — Q8740 Marfan's syndrome, unspecified: Secondary | ICD-10-CM | POA: Diagnosis not present

## 2019-12-23 DIAGNOSIS — M419 Scoliosis, unspecified: Secondary | ICD-10-CM | POA: Diagnosis not present

## 2019-12-23 DIAGNOSIS — Z7982 Long term (current) use of aspirin: Secondary | ICD-10-CM | POA: Diagnosis not present

## 2019-12-26 DIAGNOSIS — S2232XD Fracture of one rib, left side, subsequent encounter for fracture with routine healing: Secondary | ICD-10-CM | POA: Diagnosis not present

## 2019-12-26 DIAGNOSIS — Q8740 Marfan's syndrome, unspecified: Secondary | ICD-10-CM | POA: Diagnosis not present

## 2019-12-26 DIAGNOSIS — J9600 Acute respiratory failure, unspecified whether with hypoxia or hypercapnia: Secondary | ICD-10-CM | POA: Diagnosis not present

## 2019-12-26 DIAGNOSIS — Z4789 Encounter for other orthopedic aftercare: Secondary | ICD-10-CM | POA: Diagnosis not present

## 2019-12-26 DIAGNOSIS — M25461 Effusion, right knee: Secondary | ICD-10-CM | POA: Diagnosis not present

## 2019-12-26 DIAGNOSIS — S7292XD Unspecified fracture of left femur, subsequent encounter for closed fracture with routine healing: Secondary | ICD-10-CM | POA: Diagnosis not present

## 2019-12-26 DIAGNOSIS — M8000XA Age-related osteoporosis with current pathological fracture, unspecified site, initial encounter for fracture: Secondary | ICD-10-CM | POA: Diagnosis not present

## 2019-12-26 DIAGNOSIS — M6281 Muscle weakness (generalized): Secondary | ICD-10-CM | POA: Diagnosis not present

## 2019-12-26 DIAGNOSIS — J9601 Acute respiratory failure with hypoxia: Secondary | ICD-10-CM | POA: Diagnosis not present

## 2020-01-07 DIAGNOSIS — M79605 Pain in left leg: Secondary | ICD-10-CM | POA: Diagnosis not present

## 2020-01-07 DIAGNOSIS — Z4789 Encounter for other orthopedic aftercare: Secondary | ICD-10-CM | POA: Diagnosis not present

## 2020-01-20 DIAGNOSIS — H27112 Subluxation of lens, left eye: Secondary | ICD-10-CM | POA: Diagnosis not present

## 2020-01-26 DIAGNOSIS — I10 Essential (primary) hypertension: Secondary | ICD-10-CM | POA: Diagnosis not present

## 2020-01-26 DIAGNOSIS — G8929 Other chronic pain: Secondary | ICD-10-CM | POA: Diagnosis not present

## 2020-01-26 DIAGNOSIS — M25461 Effusion, right knee: Secondary | ICD-10-CM | POA: Diagnosis not present

## 2020-01-26 DIAGNOSIS — L89622 Pressure ulcer of left heel, stage 2: Secondary | ICD-10-CM | POA: Diagnosis not present

## 2020-01-26 DIAGNOSIS — M800AXD Age-related osteoporosis with current pathological fracture, other site, subsequent encounter for fracture with routine healing: Secondary | ICD-10-CM | POA: Diagnosis not present

## 2020-01-26 DIAGNOSIS — Z7982 Long term (current) use of aspirin: Secondary | ICD-10-CM | POA: Diagnosis not present

## 2020-01-26 DIAGNOSIS — Z4789 Encounter for other orthopedic aftercare: Secondary | ICD-10-CM | POA: Diagnosis not present

## 2020-01-26 DIAGNOSIS — J9600 Acute respiratory failure, unspecified whether with hypoxia or hypercapnia: Secondary | ICD-10-CM | POA: Diagnosis not present

## 2020-01-26 DIAGNOSIS — M6281 Muscle weakness (generalized): Secondary | ICD-10-CM | POA: Diagnosis not present

## 2020-01-26 DIAGNOSIS — S2232XD Fracture of one rib, left side, subsequent encounter for fracture with routine healing: Secondary | ICD-10-CM | POA: Diagnosis not present

## 2020-01-26 DIAGNOSIS — J9601 Acute respiratory failure with hypoxia: Secondary | ICD-10-CM | POA: Diagnosis not present

## 2020-01-26 DIAGNOSIS — M8000XA Age-related osteoporosis with current pathological fracture, unspecified site, initial encounter for fracture: Secondary | ICD-10-CM | POA: Diagnosis not present

## 2020-01-26 DIAGNOSIS — Z9181 History of falling: Secondary | ICD-10-CM | POA: Diagnosis not present

## 2020-01-26 DIAGNOSIS — Q8740 Marfan's syndrome, unspecified: Secondary | ICD-10-CM | POA: Diagnosis not present

## 2020-01-26 DIAGNOSIS — S7292XD Unspecified fracture of left femur, subsequent encounter for closed fracture with routine healing: Secondary | ICD-10-CM | POA: Diagnosis not present

## 2020-01-26 DIAGNOSIS — Z87440 Personal history of urinary (tract) infections: Secondary | ICD-10-CM | POA: Diagnosis not present

## 2020-01-26 DIAGNOSIS — M419 Scoliosis, unspecified: Secondary | ICD-10-CM | POA: Diagnosis not present

## 2020-01-26 DIAGNOSIS — M80052D Age-related osteoporosis with current pathological fracture, left femur, subsequent encounter for fracture with routine healing: Secondary | ICD-10-CM | POA: Diagnosis not present

## 2020-02-03 DIAGNOSIS — H35033 Hypertensive retinopathy, bilateral: Secondary | ICD-10-CM | POA: Diagnosis not present

## 2020-02-03 DIAGNOSIS — H43813 Vitreous degeneration, bilateral: Secondary | ICD-10-CM | POA: Diagnosis not present

## 2020-02-03 DIAGNOSIS — H27112 Subluxation of lens, left eye: Secondary | ICD-10-CM | POA: Diagnosis not present

## 2020-02-03 DIAGNOSIS — Q8740 Marfan's syndrome, unspecified: Secondary | ICD-10-CM | POA: Diagnosis not present

## 2020-02-17 DIAGNOSIS — S72142D Displaced intertrochanteric fracture of left femur, subsequent encounter for closed fracture with routine healing: Secondary | ICD-10-CM | POA: Diagnosis not present

## 2020-02-25 DIAGNOSIS — M25552 Pain in left hip: Secondary | ICD-10-CM | POA: Diagnosis not present

## 2020-02-26 DIAGNOSIS — Q8740 Marfan's syndrome, unspecified: Secondary | ICD-10-CM | POA: Diagnosis not present

## 2020-02-26 DIAGNOSIS — M8000XA Age-related osteoporosis with current pathological fracture, unspecified site, initial encounter for fracture: Secondary | ICD-10-CM | POA: Diagnosis not present

## 2020-02-26 DIAGNOSIS — J9601 Acute respiratory failure with hypoxia: Secondary | ICD-10-CM | POA: Diagnosis not present

## 2020-02-26 DIAGNOSIS — J9600 Acute respiratory failure, unspecified whether with hypoxia or hypercapnia: Secondary | ICD-10-CM | POA: Diagnosis not present

## 2020-02-26 DIAGNOSIS — S7292XD Unspecified fracture of left femur, subsequent encounter for closed fracture with routine healing: Secondary | ICD-10-CM | POA: Diagnosis not present

## 2020-02-26 DIAGNOSIS — M6281 Muscle weakness (generalized): Secondary | ICD-10-CM | POA: Diagnosis not present

## 2020-02-26 DIAGNOSIS — S2232XD Fracture of one rib, left side, subsequent encounter for fracture with routine healing: Secondary | ICD-10-CM | POA: Diagnosis not present

## 2020-02-26 DIAGNOSIS — M25461 Effusion, right knee: Secondary | ICD-10-CM | POA: Diagnosis not present

## 2020-02-26 DIAGNOSIS — Z4789 Encounter for other orthopedic aftercare: Secondary | ICD-10-CM | POA: Diagnosis not present

## 2020-02-28 DIAGNOSIS — M25552 Pain in left hip: Secondary | ICD-10-CM | POA: Diagnosis not present

## 2020-03-02 DIAGNOSIS — M25552 Pain in left hip: Secondary | ICD-10-CM | POA: Diagnosis not present

## 2020-03-06 DIAGNOSIS — M25552 Pain in left hip: Secondary | ICD-10-CM | POA: Diagnosis not present

## 2020-03-16 DIAGNOSIS — S72142D Displaced intertrochanteric fracture of left femur, subsequent encounter for closed fracture with routine healing: Secondary | ICD-10-CM | POA: Diagnosis not present

## 2020-03-16 DIAGNOSIS — M79662 Pain in left lower leg: Secondary | ICD-10-CM | POA: Diagnosis not present

## 2020-03-20 DIAGNOSIS — E785 Hyperlipidemia, unspecified: Secondary | ICD-10-CM | POA: Diagnosis not present

## 2020-03-20 DIAGNOSIS — I10 Essential (primary) hypertension: Secondary | ICD-10-CM | POA: Diagnosis not present

## 2020-03-20 DIAGNOSIS — R7303 Prediabetes: Secondary | ICD-10-CM | POA: Diagnosis not present

## 2020-03-20 DIAGNOSIS — M1990 Unspecified osteoarthritis, unspecified site: Secondary | ICD-10-CM | POA: Diagnosis not present

## 2020-03-27 DIAGNOSIS — M25461 Effusion, right knee: Secondary | ICD-10-CM | POA: Diagnosis not present

## 2020-03-27 DIAGNOSIS — S2232XD Fracture of one rib, left side, subsequent encounter for fracture with routine healing: Secondary | ICD-10-CM | POA: Diagnosis not present

## 2020-03-27 DIAGNOSIS — J9601 Acute respiratory failure with hypoxia: Secondary | ICD-10-CM | POA: Diagnosis not present

## 2020-03-27 DIAGNOSIS — Z4789 Encounter for other orthopedic aftercare: Secondary | ICD-10-CM | POA: Diagnosis not present

## 2020-03-27 DIAGNOSIS — M8000XA Age-related osteoporosis with current pathological fracture, unspecified site, initial encounter for fracture: Secondary | ICD-10-CM | POA: Diagnosis not present

## 2020-03-27 DIAGNOSIS — Q8740 Marfan's syndrome, unspecified: Secondary | ICD-10-CM | POA: Diagnosis not present

## 2020-03-27 DIAGNOSIS — J9600 Acute respiratory failure, unspecified whether with hypoxia or hypercapnia: Secondary | ICD-10-CM | POA: Diagnosis not present

## 2020-03-27 DIAGNOSIS — M6281 Muscle weakness (generalized): Secondary | ICD-10-CM | POA: Diagnosis not present

## 2020-03-27 DIAGNOSIS — S7292XD Unspecified fracture of left femur, subsequent encounter for closed fracture with routine healing: Secondary | ICD-10-CM | POA: Diagnosis not present

## 2020-04-04 ENCOUNTER — Observation Stay (HOSPITAL_COMMUNITY)
Admission: EM | Admit: 2020-04-04 | Discharge: 2020-04-06 | Disposition: A | Discharge status: Home / Self Care | Payer: PPO | Attending: Internal Medicine | Admitting: Internal Medicine

## 2020-04-04 ENCOUNTER — Emergency Department (HOSPITAL_COMMUNITY): Payer: PPO

## 2020-04-04 ENCOUNTER — Encounter (HOSPITAL_COMMUNITY): Payer: Self-pay | Admitting: Emergency Medicine

## 2020-04-04 ENCOUNTER — Other Ambulatory Visit: Payer: Self-pay

## 2020-04-04 DIAGNOSIS — Z20822 Contact with and (suspected) exposure to covid-19: Secondary | ICD-10-CM | POA: Insufficient documentation

## 2020-04-04 DIAGNOSIS — I672 Cerebral atherosclerosis: Secondary | ICD-10-CM | POA: Diagnosis not present

## 2020-04-04 DIAGNOSIS — G459 Transient cerebral ischemic attack, unspecified: Principal | ICD-10-CM | POA: Insufficient documentation

## 2020-04-04 DIAGNOSIS — R2243 Localized swelling, mass and lump, lower limb, bilateral: Secondary | ICD-10-CM | POA: Diagnosis not present

## 2020-04-04 DIAGNOSIS — S72002A Fracture of unspecified part of neck of left femur, initial encounter for closed fracture: Secondary | ICD-10-CM | POA: Diagnosis present

## 2020-04-04 DIAGNOSIS — R4701 Aphasia: Secondary | ICD-10-CM | POA: Diagnosis not present

## 2020-04-04 DIAGNOSIS — I10 Essential (primary) hypertension: Secondary | ICD-10-CM | POA: Diagnosis not present

## 2020-04-04 DIAGNOSIS — F17210 Nicotine dependence, cigarettes, uncomplicated: Secondary | ICD-10-CM | POA: Diagnosis not present

## 2020-04-04 DIAGNOSIS — F801 Expressive language disorder: Secondary | ICD-10-CM | POA: Diagnosis present

## 2020-04-04 DIAGNOSIS — I6523 Occlusion and stenosis of bilateral carotid arteries: Secondary | ICD-10-CM | POA: Diagnosis not present

## 2020-04-04 DIAGNOSIS — R29818 Other symptoms and signs involving the nervous system: Secondary | ICD-10-CM | POA: Diagnosis not present

## 2020-04-04 DIAGNOSIS — G319 Degenerative disease of nervous system, unspecified: Secondary | ICD-10-CM | POA: Diagnosis not present

## 2020-04-04 DIAGNOSIS — Q8740 Marfan's syndrome, unspecified: Secondary | ICD-10-CM

## 2020-04-04 DIAGNOSIS — I708 Atherosclerosis of other arteries: Secondary | ICD-10-CM | POA: Diagnosis not present

## 2020-04-04 DIAGNOSIS — I739 Peripheral vascular disease, unspecified: Secondary | ICD-10-CM | POA: Diagnosis not present

## 2020-04-04 DIAGNOSIS — I6782 Cerebral ischemia: Secondary | ICD-10-CM | POA: Diagnosis not present

## 2020-04-04 DIAGNOSIS — Z79899 Other long term (current) drug therapy: Secondary | ICD-10-CM | POA: Insufficient documentation

## 2020-04-04 LAB — I-STAT CHEM 8, ED
BUN: 10 mg/dL (ref 8–23)
Calcium, Ion: 1.24 mmol/L (ref 1.15–1.40)
Chloride: 105 mmol/L (ref 98–111)
Creatinine, Ser: 0.7 mg/dL (ref 0.44–1.00)
Glucose, Bld: 94 mg/dL (ref 70–99)
HCT: 39 % (ref 36.0–46.0)
Hemoglobin: 13.3 g/dL (ref 12.0–15.0)
Potassium: 3.8 mmol/L (ref 3.5–5.1)
Sodium: 141 mmol/L (ref 135–145)
TCO2: 29 mmol/L (ref 22–32)

## 2020-04-04 LAB — CBC
HCT: 41.1 % (ref 36.0–46.0)
Hemoglobin: 12.8 g/dL (ref 12.0–15.0)
MCH: 28.5 pg (ref 26.0–34.0)
MCHC: 31.1 g/dL (ref 30.0–36.0)
MCV: 91.5 fL (ref 80.0–100.0)
Platelets: 223 10*3/uL (ref 150–400)
RBC: 4.49 MIL/uL (ref 3.87–5.11)
RDW: 14.9 % (ref 11.5–15.5)
WBC: 10 10*3/uL (ref 4.0–10.5)
nRBC: 0 % (ref 0.0–0.2)

## 2020-04-04 LAB — DIFFERENTIAL
Abs Immature Granulocytes: 0.03 10*3/uL (ref 0.00–0.07)
Basophils Absolute: 0 10*3/uL (ref 0.0–0.1)
Basophils Relative: 0 %
Eosinophils Absolute: 0 10*3/uL (ref 0.0–0.5)
Eosinophils Relative: 0 %
Immature Granulocytes: 0 %
Lymphocytes Relative: 32 %
Lymphs Abs: 3.2 10*3/uL (ref 0.7–4.0)
Monocytes Absolute: 0.7 10*3/uL (ref 0.1–1.0)
Monocytes Relative: 7 %
Neutro Abs: 6.1 10*3/uL (ref 1.7–7.7)
Neutrophils Relative %: 61 %

## 2020-04-04 LAB — COMPREHENSIVE METABOLIC PANEL
ALT: 11 U/L (ref 0–44)
AST: 14 U/L — ABNORMAL LOW (ref 15–41)
Albumin: 3.6 g/dL (ref 3.5–5.0)
Alkaline Phosphatase: 72 U/L (ref 38–126)
Anion gap: 11 (ref 5–15)
BUN: 9 mg/dL (ref 8–23)
CO2: 25 mmol/L (ref 22–32)
Calcium: 10 mg/dL (ref 8.9–10.3)
Chloride: 104 mmol/L (ref 98–111)
Creatinine, Ser: 0.78 mg/dL (ref 0.44–1.00)
GFR, Estimated: >60 mL/min (ref >60)
Glucose, Bld: 102 mg/dL — ABNORMAL HIGH (ref 70–99)
Potassium: 3.9 mmol/L (ref 3.5–5.1)
Sodium: 140 mmol/L (ref 135–145)
Total Bilirubin: 1.4 mg/dL — ABNORMAL HIGH (ref 0.3–1.2)
Total Protein: 6.7 g/dL (ref 6.5–8.1)

## 2020-04-04 LAB — PROTIME-INR
INR: 1 (ref 0.8–1.2)
Prothrombin Time: 13.1 seconds (ref 11.4–15.2)

## 2020-04-04 LAB — APTT: aPTT: 30 seconds (ref 24–36)

## 2020-04-04 MED ORDER — SODIUM CHLORIDE 0.9% FLUSH
3.0000 mL | Freq: Once | INTRAVENOUS | Status: AC
  Administered 2020-04-05: 3 mL via INTRAVENOUS

## 2020-04-04 MED ORDER — PROCHLORPERAZINE EDISYLATE 10 MG/2ML IJ SOLN
10.0000 mg | Freq: Once | INTRAMUSCULAR | Status: AC
  Administered 2020-04-04: 10 mg via INTRAVENOUS
  Filled 2020-04-04: qty 2

## 2020-04-04 MED ORDER — KETOROLAC TROMETHAMINE 30 MG/ML IJ SOLN
15.0000 mg | Freq: Once | INTRAMUSCULAR | Status: AC
  Administered 2020-04-04: 15 mg via INTRAVENOUS
  Filled 2020-04-04: qty 1

## 2020-04-04 NOTE — ED Triage Notes (Addendum)
Patient reports intermittent difficulty speaking the right words with headache onset last night ( approx. 11pm) , alert and oriented , speech is clear with no facial asymmetry , equal grips with no arm drift .

## 2020-04-04 NOTE — ED Provider Notes (Signed)
[facility] EMERGENCY DEPARTMENT Provider Note   CSN: [id number] Arrival date & time: 04/04/20  1933     History Chief Complaint  Patient presents with  . Expressive Aphasia    Leslie Dean is a 74 y.o. female.  HPI   Patient presents to the emergency room for evaluation of speech difficulty and headache.  Patient states her symptoms started last evening.  It persisted throughout the day.  She has had a constant headache that started gradually.  She is also having difficulty finding certain words.  Patient feels like sometimes she just says the right word or she knows what she wants to say but cannot quite form the word.  Patient's daughter checked on her today and found her to be very fatigued and not acting like herself.  Patient has not had any trouble with her balance or coordination.  No focal numbness or weakness.  She does have some difficulty moving her left leg but that is the she had surgery on and that is not unusual for her.  Past Medical History:  Diagnosis Date  . Hypertension   . Scoliosis   . Spinal stenosis     Patient Active Problem List   Diagnosis Date Noted  . Pressure injury of skin 10/30/2019  . Closed left hip fracture (HCC) 10/18/2019  . Acute respiratory failure (HCC) 10/18/2019  . Acute lower UTI 10/18/2019  . Abdominal pain 12/03/2012  . Hepatic cyst 12/03/2012  . HTN (hypertension) 12/03/2012  . 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 CESAREAN SECTION    . CHOLECYSTECTOMY N/A 12/04/2012   Procedure: LAPAROSCOPIC Liver Cyst Unroofing and removal of gallbladder;  Surgeon: Almond Lint, MD;  Location: [facility];  Service: General;  Laterality: N/A;  . CYST EXCISION     liver cyst removal  . EYE SURGERY    . FEMUR IM NAIL Left 10/18/2019   Procedure: INTRAMEDULLARY (IM) NAIL FEMORAL;  Surgeon: Durene Romans, MD;  Location: [facility];  Service: Orthopedics;  Laterality: Left;     OB  History   No obstetric history on file.     Family History  Problem Relation Age of Onset  . Marfan syndrome Grandchild     Social History   Tobacco Use  . Smoking status: Current Every Day Smoker    Packs/day: 1.00  . Smokeless tobacco: Never Used  Substance Use Topics  . Alcohol use: No  . Drug use: No    Home Medications Prior to Admission medications   Medication Sig Start Date End Date Taking? Authorizing Provider  Cholecalciferol (VITAMIN D3) 50 MCG (2000 UT) capsule Take 2,000 Units by mouth daily.    [provider]  ferrous sulfate 325 (65 FE) MG tablet Take 1 tablet (325 mg total) by mouth daily with breakfast. 10/30/19 11/29/19  Zigmund Daniel., MD  furosemide (LASIX) 20 MG tablet Take 20 mg by mouth daily. 09/10/19   [provider]  Magnesium 500 MG CAPS Take 500 mg by mouth daily.    [provider]  pantoprazole (PROTONIX) 40 MG tablet Take 1 tablet (40 mg total) by mouth daily. 10/30/19 11/29/19  Zigmund Daniel., MD  trazodone (DESYREL) 300 MG tablet Take 300 mg by mouth at bedtime.    [provider]  zinc gluconate 50 MG tablet Take 50 mg by mouth daily.    [provider]    Allergies    Other  Review of Systems   Review of Systems  All other systems reviewed and are negative.   Physical Exam Updated Vital Signs BP (!) 153/63 (BP Location: Left Arm)   Pulse 68   Temp 98.1 F (36.7 C) (Oral)   Resp 16   Ht 1.676 m (5\' 6" )   Wt 95 kg   SpO2 94%   BMI 33.80 kg/m   Physical Exam Vitals and nursing note reviewed.  Constitutional:      General: She is not in acute distress.    Appearance: She is well-developed.  HENT:     Head: Normocephalic and atraumatic.     Right Ear: External ear normal.     Left Ear: External ear normal.  Eyes:     General: No scleral icterus.       Right eye: No discharge.        Left eye: No discharge.     Conjunctiva/sclera: Conjunctivae normal.     Comments:  Pupils irregular, right eye dilated  Neck:     Trachea: No tracheal deviation.  Cardiovascular:     Rate and Rhythm: Normal rate and regular rhythm.  Pulmonary:     Effort: Pulmonary effort is normal. No respiratory distress.     Breath sounds: Normal breath sounds. No stridor. No wheezing or rales.  Abdominal:     General: Bowel sounds are normal. There is no distension.     Palpations: Abdomen is soft.     Tenderness: There is no abdominal tenderness. There is no guarding or rebound.  Musculoskeletal:        General: No tenderness.     Cervical back: Neck supple.     Right lower leg: Edema present.     Left lower leg: Edema present.     Comments: Mild edema in the ankles  Skin:    General: Skin is warm and dry.     Findings: No rash.  Neurological:     Mental Status: She is alert and oriented to person, place, and time.     Cranial Nerves: Dysarthria present. No cranial nerve deficit (No facial droop, extraocular movements intact, tongue midline  , mild aphasia).     Sensory: No sensory deficit.     Motor: No abnormal muscle tone or seizure activity.     Coordination: Coordination normal.     Comments: No pronator drift bilateral upper extrem, able to hold both legs off bed for 5 seconds, sensation intact in all extremities, no visual field cuts, no left or right sided neglect, normal finger-nose exam bilaterally, no nystagmus noted      ED Results / Procedures / Treatments   Labs (all labs ordered are listed, but only abnormal results are displayed) Labs Reviewed  COMPREHENSIVE METABOLIC PANEL - Abnormal; Notable for the following components:      Result Value   Glucose, Bld 102 (*)    AST 14 (*)    Total Bilirubin 1.4 (*)    All other components within normal limits  PROTIME-INR  APTT  CBC  DIFFERENTIAL  I-STAT CHEM 8, ED  CBG MONITORING, ED    EKG None  Radiology CT HEAD WO CONTRAST  Result Date: 04/04/2020 CLINICAL DATA:  Expressive aphasia EXAM: CT HEAD  WITHOUT CONTRAST TECHNIQUE: Contiguous axial images were obtained from the base of the skull through the vertex without intravenous contrast. COMPARISON:  None. FINDINGS: Brain: There is no mass, hemorrhage or extra-axial collection. The size and configuration of the ventricles and extra-axial  CSF spaces are normal. There is hypoattenuation of the white matter, most commonly indicating chronic small vessel disease. Vascular: Atherosclerotic calcification of the vertebral and internal carotid arteries at the skull base. No abnormal hyperdensity of the major intracranial arteries or dural venous sinuses. Skull: The visualized skull base, calvarium and extracranial soft tissues are normal. Sinuses/Orbits: No fluid levels or advanced mucosal thickening of the visualized paranasal sinuses. No mastoid or middle ear effusion. The orbits are normal. IMPRESSION: Chronic small vessel disease without acute intracranial abnormality. Electronically Signed   By: Deatra Robinson M.D.   On: 04/04/2020 20:51    Procedures Procedures (including critical care time)  Medications Ordered in ED Medications  sodium chloride flush (NS) 0.9 % injection 3 mL (has no administration in time range)    ED Course  I have reviewed the triage vital signs and the nursing notes.  Pertinent labs & imaging results that were available during my care of the patient were reviewed by me and considered in my medical decision making (see chart for details).    MDM Rules/Calculators/A&P                          Patient presented with complaints of speech difficulties and headache.  Head CT did not show any acute findings.  No signs of hemorrhage.  Symptoms not suggestive of subarachnoid hemorrhage.  Symptoms concerning for the possibility of complex migraine versus occult stroke.  Head CT is negative.  We will plan on MRI.  Migraine cocktail has also been ordered.  Dr. Bebe Shaggy will follow up on MRI findings.  If negative anticipate patient  can be safely discharged. Final Clinical Impression(s) / ED Diagnoses pending   Linwood Dibbles, MD 04/04/20 2330

## 2020-04-05 ENCOUNTER — Observation Stay (HOSPITAL_BASED_OUTPATIENT_CLINIC_OR_DEPARTMENT_OTHER): Payer: PPO

## 2020-04-05 ENCOUNTER — Observation Stay (HOSPITAL_COMMUNITY): Payer: PPO

## 2020-04-05 DIAGNOSIS — R4701 Aphasia: Secondary | ICD-10-CM | POA: Diagnosis not present

## 2020-04-05 DIAGNOSIS — I10 Essential (primary) hypertension: Secondary | ICD-10-CM | POA: Diagnosis not present

## 2020-04-05 DIAGNOSIS — G459 Transient cerebral ischemic attack, unspecified: Secondary | ICD-10-CM | POA: Diagnosis present

## 2020-04-05 DIAGNOSIS — Q8740 Marfan's syndrome, unspecified: Secondary | ICD-10-CM

## 2020-04-05 DIAGNOSIS — I6611 Occlusion and stenosis of right anterior cerebral artery: Secondary | ICD-10-CM | POA: Diagnosis not present

## 2020-04-05 DIAGNOSIS — Z8673 Personal history of transient ischemic attack (TIA), and cerebral infarction without residual deficits: Secondary | ICD-10-CM | POA: Diagnosis not present

## 2020-04-05 DIAGNOSIS — I671 Cerebral aneurysm, nonruptured: Secondary | ICD-10-CM | POA: Diagnosis not present

## 2020-04-05 LAB — BASIC METABOLIC PANEL
Anion gap: 9 (ref 5–15)
BUN: 10 mg/dL (ref 8–23)
CO2: 25 mmol/L (ref 22–32)
Calcium: 9.6 mg/dL (ref 8.9–10.3)
Chloride: 109 mmol/L (ref 98–111)
Creatinine, Ser: 0.83 mg/dL (ref 0.44–1.00)
GFR, Estimated: >60 mL/min (ref >60)
Glucose, Bld: 92 mg/dL (ref 70–99)
Potassium: 4 mmol/L (ref 3.5–5.1)
Sodium: 143 mmol/L (ref 135–145)

## 2020-04-05 LAB — RESP PANEL BY RT PCR (RSV, FLU A&B, COVID)
Influenza A by PCR: NEGATIVE
Influenza B by PCR: NEGATIVE
Respiratory Syncytial Virus by PCR: NEGATIVE
SARS Coronavirus 2 by RT PCR: NEGATIVE

## 2020-04-05 LAB — LIPID PANEL
Cholesterol: 124 mg/dL (ref 0–200)
HDL: 37 mg/dL — ABNORMAL LOW (ref >40)
LDL Cholesterol: 68 mg/dL (ref 0–99)
Total CHOL/HDL Ratio: 3.4 RATIO
Triglycerides: 96 mg/dL (ref <150)
VLDL: 19 mg/dL (ref 0–40)

## 2020-04-05 MED ORDER — SODIUM CHLORIDE 0.9 % IV SOLN: INTRAVENOUS | Status: AC

## 2020-04-05 MED ORDER — ASPIRIN 81 MG PO CHEW
81.0000 mg | CHEWABLE_TABLET | Freq: Every day | ORAL | Status: DC
  Administered 2020-04-06: 81 mg via ORAL
  Filled 2020-04-05: qty 1

## 2020-04-05 MED ORDER — NAPHAZOLINE-GLYCERIN 0.012-0.2 % OP SOLN
1.0000 [drp] | Freq: Four times a day (QID) | OPHTHALMIC | Status: DC | PRN
  Filled 2020-04-05: qty 15

## 2020-04-05 MED ORDER — ASPIRIN 81 MG PO CHEW
324.0000 mg | CHEWABLE_TABLET | Freq: Once | ORAL | Status: AC
  Administered 2020-04-05: 324 mg via ORAL
  Filled 2020-04-05: qty 4

## 2020-04-05 MED ORDER — ACETAMINOPHEN 325 MG PO TABS
650.0000 mg | ORAL_TABLET | ORAL | Status: DC | PRN
  Administered 2020-04-05 (×2): 650 mg via ORAL
  Filled 2020-04-05 (×2): qty 2

## 2020-04-05 MED ORDER — SENNOSIDES-DOCUSATE SODIUM 8.6-50 MG PO TABS: 1.0000 | ORAL_TABLET | Freq: Every evening | ORAL | Status: DC | PRN

## 2020-04-05 MED ORDER — CLOPIDOGREL BISULFATE 75 MG PO TABS
75.0000 mg | ORAL_TABLET | Freq: Every day | ORAL | Status: DC
  Administered 2020-04-05 – 2020-04-06 (×2): 75 mg via ORAL
  Filled 2020-04-05 (×2): qty 1

## 2020-04-05 MED ORDER — ACETAMINOPHEN 650 MG RE SUPP: 650.0000 mg | RECTAL | Status: DC | PRN

## 2020-04-05 MED ORDER — STROKE: EARLY STAGES OF RECOVERY BOOK
Freq: Once | Status: AC
  Administered 2020-04-06: 1
  Filled 2020-04-05: qty 1

## 2020-04-05 MED ORDER — ACETAMINOPHEN 160 MG/5ML PO SOLN: 650.0000 mg | ORAL | Status: DC | PRN

## 2020-04-05 NOTE — Progress Notes (Signed)
PROGRESS NOTE    Leslie Dean  SPQ:[id number] DOB: November 12, 1945 DOA: 04/04/2020 PCP: Patient, No Pcp Per    Brief Narrative:  Leslie Dean is a 74 year old female with past medical history notable for essential hypertension, scoliosis, hyperlipidemia, reported diagnosis of Marfan syndrome with subluxation of a left lens, recent fall with closed left hip fracture who presented to the emergency department with expressive aphasia.  Onset Monday evening 04/03/2020 with associated profound weakness, exhaustion and left frontal headache.  When her daughter returned on 04/04/2020, noted mother had expressive aphasia with some facial drooping.  In the ED, her symptoms of expressive aphasia, headache and weakness have almost completely resolved.  CT head without contrast negative for acute intracranial abnormality.  Neurology was consulted, hospitalist service consulted for further evaluation and treatment for likely TIA.   Assessment & Plan:   Principal Problem:   TIA (transient ischemic attack) Active Problems:   HTN (hypertension)   Marfans syndrome   Closed left hip fracture (HCC)   Expressive aphasia: Resolved Transient ischemic attack Patient presenting with expressive aphasia, headache, weakness.  CT head without contrast without acute intracranial abnormality.  MR brain with no acute intracranial normality but does note generalized atrophy with chronic ischemic microangiopathy.  MRA, motion degraded but no LVO, intracranial atherosclerosis with moderate/severe focal mid superior L M2 branch, moderate inferior proximal R M2, severe proximal R A2 stenosis.  Vascular duplex ultrasound bilateral carotids with 1-39% stenosis bilateral ICA.  Patient was seen by speech therapy, no deficits appreciated.  Total cholesterol 124, HDL 37, LDL 68. --Neurology following, appreciate assistance --Pending echocardiogram --Pending PT/OT evaluation --Hemoglobin A1c pending --DAPT with Plavix 75 mg  p.o. daily x3 months for intracranial atherosclerosis and aspirin 81 mg p.o. daily, followed by aspirin alone --Plan follow-up [facility] 4 weeks  Essential hypertension Losartan 50 mg p.o. daily, furosemide and Metoprolol succinate at home. --Holding home antihypertensives to allow permissive hypertension following TIA  History of closed left hip fracture Status post ORIF 10/18/2019.  Patient able to ambulate with assistance. --PT/OT evaluation  Marfan syndrome Scoliosis --Outpatient follow-up   DVT prophylaxis: SCDs Code Status: DNI Family Communication: Daughter present at bedside  Disposition Plan:  Status is: Observation  The patient remains OBS appropriate and will d/c before 2 midnights.  Dispo: The patient is from: Home              Anticipated d/c is to: Home              Anticipated d/c date is: 1 day              Patient currently is not medically stable to d/c.    Consultants:   Neurology  Procedures:   Carotid duplex ultrasound  TTE pending  Antimicrobials:   None   Subjective: Patient seen and examined bedside, resting comfortably.  Sitting at edge of bed.  Neurology, nursing staff/PT present.  Awaiting carotid duplex ultrasound and TTE.  Symptoms of headache, aphasia, weakness have resolved and patient feels back to her normal baseline.  Daughter present at bedside and updated.  Awaiting official PT/OT evaluation.  Patient with no other questions or concerns at this time.  Denies headache, no dizziness, no visual disturbance, no chest pain, no palpitations, no shortness of breath, no abdominal pain, no fever/chills/night sweats, no nausea/vomiting/diarrhea, no current weakness, no fatigue, no paresthesias.  No acute events overnight per nursing staff.  Objective: Vitals:   04/05/20 3335 04/05/20 0815 04/05/20 1201 04/05/20 1608  BP: (!) 142/66 (!) 147/67 (!) 131/58 (!) 154/62  Pulse: 61 61 62 61  Resp: 20 16 20 20   Temp: 97.6 F  (36.4 C) 97.8 F (36.6 C) (!) 97.2 F (36.2 C) 98 F (36.7 C)  TempSrc: Oral Oral Oral Oral  SpO2: 95% 91% 96% 95%  Weight:      Height:        Intake/Output Summary (Last 24 hours) at 04/05/2020 1741 Last data filed at 04/05/2020 1211 Gross per 24 hour  Intake 480 ml  Output 500 ml  Net -20 ml   Filed Weights   04/04/20 1948  Weight: 95 kg    Examination:  General exam: Appears calm and comfortable  Respiratory system: Clear to auscultation. Respiratory effort normal. Cardiovascular system: S1 & S2 heard, RRR. No JVD, murmurs, rubs, gallops or clicks. No pedal edema. Gastrointestinal system: Abdomen is nondistended, soft and nontender. No organomegaly or masses felt. Normal bowel sounds heard. Central nervous system: Alert and oriented. No focal neurological deficits. Extremities: Symmetric 5 x 5 power. Skin: No rashes, lesions or ulcers Psychiatry: Judgement and insight appear normal. Mood & affect appropriate.     Data Reviewed: I have personally reviewed following labs and imaging studies  CBC: Recent Labs  Lab 04/04/20 1957 04/04/20 2044  WBC 10.0  --   NEUTROABS 6.1  --   HGB 12.8 13.3  HCT 41.1 39.0  MCV 91.5  --   PLT 223  --    Basic Metabolic Panel: Recent Labs  Lab 04/04/20 1957 04/04/20 2044  NA 140 141  K 3.9 3.8  CL 104 105  CO2 25  --   GLUCOSE 102* 94  BUN 9 10  CREATININE 0.78 0.70  CALCIUM 10.0  --    GFR: Estimated Creatinine Clearance: 71.7 mL/min (by C-G formula based on SCr of 0.7 mg/dL). Liver Function Tests: Recent Labs  Lab 04/04/20 1957  AST 14*  ALT 11  ALKPHOS 72  BILITOT 1.4*  PROT 6.7  ALBUMIN 3.6   No results for input(s): LIPASE, AMYLASE in the last 168 hours. No results for input(s): AMMONIA in the last 168 hours. Coagulation Profile: Recent Labs  Lab 04/04/20 1957  INR 1.0   Cardiac Enzymes: No results for input(s): CKTOTAL, CKMB, CKMBINDEX, TROPONINI in the last 168 hours. BNP (last 3  results) No results for input(s): PROBNP in the last 8760 hours. HbA1C: No results for input(s): HGBA1C in the last 72 hours. CBG: No results for input(s): GLUCAP in the last 168 hours. Lipid Profile: Recent Labs    04/05/20 0512  CHOL 124  HDL 37*  LDLCALC 68  TRIG 96  CHOLHDL 3.4   Thyroid Function Tests: No results for input(s): TSH, T4TOTAL, FREET4, T3FREE, THYROIDAB in the last 72 hours. Anemia Panel: No results for input(s): VITAMINB12, FOLATE, FERRITIN, TIBC, IRON, RETICCTPCT in the last 72 hours. Sepsis Labs: No results for input(s): PROCALCITON, LATICACIDVEN in the last 168 hours.  Recent Results (from the past 240 hour(s))  Resp Panel by RT PCR (RSV, Flu A&B, Covid) - Nasopharyngeal Swab     Status: None   Collection Time: 04/05/20  6:21 AM   Specimen: Nasopharyngeal Swab  Result Value Ref Range Status   SARS Coronavirus 2 by RT PCR NEGATIVE NEGATIVE Final    Comment: (NOTE) SARS-CoV-2 target nucleic acids are NOT DETECTED.  The SARS-CoV-2 RNA is generally detectable in upper respiratoy specimens during the acute phase of infection. The lowest concentration of SARS-CoV-2 viral copies  this assay can detect is 131 copies/mL. A negative result does not preclude SARS-Cov-2 infection and should not be used as the sole basis for treatment or other patient management decisions. A negative result may occur with  improper specimen collection/handling, submission of specimen other than nasopharyngeal swab, presence of viral mutation(s) within the areas targeted by this assay, and inadequate number of viral copies (<131 copies/mL). A negative result must be combined with clinical observations, patient history, and epidemiological information. The expected result is Negative.  Fact Sheet for Patients:  https://www.moore.com/  Fact Sheet for Healthcare Providers:  https://www.young.biz/  This test is no t yet approved or cleared by  the [place] FDA and  has been authorized for detection and/or diagnosis of SARS-CoV-2 by FDA under an Emergency Use Authorization (EUA). This EUA will remain  in effect (meaning this test can be used) for the duration of the COVID-19 declaration under Section 564(b)(1) of the Act, 21 U.S.C. section 360bbb-3(b)(1), unless the authorization is terminated or revoked sooner.     Influenza A by PCR NEGATIVE NEGATIVE Final   Influenza B by PCR NEGATIVE NEGATIVE Final    Comment: (NOTE) The Xpert Xpress SARS-CoV-2/FLU/RSV assay is intended as an aid in  the diagnosis of influenza from Nasopharyngeal swab specimens and  should not be used as a sole basis for treatment. Nasal washings and  aspirates are unacceptable for Xpert Xpress SARS-CoV-2/FLU/RSV  testing.  Fact Sheet for Patients: https://www.moore.com/  Fact Sheet for Healthcare Providers: https://www.young.biz/  This test is not yet approved or cleared by the [place] FDA and  has been authorized for detection and/or diagnosis of SARS-CoV-2 by  FDA under an Emergency Use Authorization (EUA). This EUA will remain  in effect (meaning this test can be used) for the duration of the  Covid-19 declaration under Section 564(b)(1) of the Act, 21  U.S.C. section 360bbb-3(b)(1), unless the authorization is  terminated or revoked.    Respiratory Syncytial Virus by PCR NEGATIVE NEGATIVE Final    Comment: (NOTE) Fact Sheet for Patients: https://www.moore.com/  Fact Sheet for Healthcare Providers: https://www.young.biz/  This test is not yet approved or cleared by the [place] FDA and  has been authorized for detection and/or diagnosis of SARS-CoV-2 by  FDA under an Emergency Use Authorization (EUA). This EUA will remain  in effect (meaning this test can be used) for the duration of the  COVID-19 declaration under Section 564(b)(1) of the Act, 21  U.S.C.  section 360bbb-3(b)(1), unless the authorization is terminated or  revoked. Performed at [facility], [address] [place], [place] [address]          Radiology Studies: CT HEAD WO CONTRAST  Result Date: 04/04/2020 CLINICAL DATA:  Expressive aphasia EXAM: CT HEAD WITHOUT CONTRAST TECHNIQUE: Contiguous axial images were obtained from the base of the skull through the vertex without intravenous contrast. COMPARISON:  None. FINDINGS: Brain: There is no mass, hemorrhage or extra-axial collection. The size and configuration of the ventricles and extra-axial CSF spaces are normal. There is hypoattenuation of the white matter, most commonly indicating chronic small vessel disease. Vascular: Atherosclerotic calcification of the vertebral and internal carotid arteries at the skull base. No abnormal hyperdensity of the major intracranial arteries or dural venous sinuses. Skull: The visualized skull base, calvarium and extracranial soft tissues are normal. Sinuses/Orbits: No fluid levels or advanced mucosal thickening of the visualized paranasal sinuses. No mastoid or middle ear effusion. The orbits are normal. IMPRESSION: Chronic small vessel disease  without acute intracranial abnormality. Electronically Signed   By: Deatra Robinson M.D.   On: 04/04/2020 20:51   MR ANGIO HEAD WO CONTRAST  Result Date: 04/05/2020 CLINICAL DATA:  Transient ischemic attack (TIA). Additional history provided: Expressive aphasia. EXAM: MRA HEAD WITHOUT CONTRAST TECHNIQUE: Angiographic images of the Circle of Willis were obtained using MRA technique without intravenous contrast. COMPARISON:  Brain MRI 04/04/2020.  Head CT 04/04/2020. FINDINGS: The examination is moderately motion degraded, limiting evaluation for intracranial stenoses and for small aneurysms. The intracranial internal carotid arteries are patent. The M1 middle cerebral arteries are patent without significant stenosis. No M2 proximal branch  occlusion is identified. Atherosclerotic irregularity of the M2 and more distal MCA branch vessels bilaterally. Most notably, there is an apparent moderate/severe focal stenosis within a proximal to mid superior division left M2 MCA branch vessel (series 253, image 3). Moderate stenosis of an inferior defect proximal M2 right MCA branch vessel (series 253, image 17). The anterior cerebral arteries are patent. Apparent severe stenosis within the proximal A2 right anterior cerebral artery (series 253, image 16). The visualized intracranial vertebral arteries are patent without significant stenosis. The left vertebral artery is dominant. The basilar artery is patent. The posterior cerebral arteries are patent proximally without significant stenosis. No intracranial aneurysm is identified. IMPRESSION: Moderately motion degraded examination, limiting evaluation for intracranial arterial stenoses and for small aneurysms. No intracranial large vessel occlusion. Intracranial atherosclerotic disease multifocal stenoses, most notably as follows. Apparent moderate/severe focal stenosis within a proximal to mid superior division left M2 MCA branch vessel. Apparent moderate stenosis of an inferior division proximal M2 right MCA branch vessel. Apparent severe stenosis within the proximal A2 right anterior cerebral artery. These stenoses could be accentuated by motion artifact on the current exam. Electronically Signed   By: Jackey Loge DO   On: 04/05/2020 10:27   MR BRAIN WO CONTRAST  Result Date: 04/05/2020 CLINICAL DATA:  Acute neurologic deficit EXAM: MRI HEAD WITHOUT CONTRAST TECHNIQUE: Multiplanar, multiecho pulse sequences of the brain and surrounding structures were obtained without intravenous contrast. COMPARISON:  None. FINDINGS: Brain: No acute infarct, acute hemorrhage or extra-axial collection. Early confluent hyperintense T2-weighted signal of the periventricular and deep white matter. There is generalized  atrophy without lobar predilection. No chronic microhemorrhage. Normal midline structures. Vascular: Normal flow voids. Skull and upper cervical spine: Normal marrow signal. Sinuses/Orbits: Negative. Other: None. IMPRESSION: 1. No acute intracranial abnormality. 2. Generalized atrophy and findings of chronic ischemic microangiopathy. Electronically Signed   By: Deatra Robinson M.D.   On: 04/05/2020 00:02   VAS US CAROTID  Result Date: 04/05/2020 Carotid Arterial Duplex Study Indications:       TIA. Risk Factors:      Hypertension, hyperlipidemia. Comparison Study:  No prior studies. Performing Technologist: Jean Rosenthal, RDMS  Examination Guidelines: A complete evaluation includes B-mode imaging, spectral Doppler, color Doppler, and power Doppler as needed of all accessible portions of each vessel. Bilateral testing is considered an integral part of a complete examination. Limited examinations for reoccurring indications may be performed as noted.  Right Carotid Findings: +----------+--------+--------+--------+------------------+--------+           PSV cm/sEDV cm/sStenosisPlaque DescriptionComments +----------+--------+--------+--------+------------------+--------+ CCA Prox  73      11              heterogenous               +----------+--------+--------+--------+------------------+--------+ CCA Distal65      12                                         +----------+--------+--------+--------+------------------+--------+  ICA Prox  103     17      1-39%   heterogenous               +----------+--------+--------+--------+------------------+--------+ ICA Distal71      14                                         +----------+--------+--------+--------+------------------+--------+ ECA       137     18                                         +----------+--------+--------+--------+------------------+--------+ +----------+--------+-------+----------------+-------------------+            PSV cm/sEDV cmsDescribe        Arm Pressure (mmHG) +----------+--------+-------+----------------+-------------------+ [id number]            Multiphasic, WNL                    +----------+--------+-------+----------------+-------------------+ +---------+--------+--+--------+-+---------+ VertebralPSV cm/s31EDV cm/s8Antegrade +---------+--------+--+--------+-+---------+  Left Carotid Findings: +----------+--------+--------+--------+------------------+--------+           PSV cm/sEDV cm/sStenosisPlaque DescriptionComments +----------+--------+--------+--------+------------------+--------+ CCA Prox  100     13                                         +----------+--------+--------+--------+------------------+--------+ CCA Distal63      15                                         +----------+--------+--------+--------+------------------+--------+ ICA Prox  59      11      1-39%   heterogenous               +----------+--------+--------+--------+------------------+--------+ ICA Distal72      14                                         +----------+--------+--------+--------+------------------+--------+ ECA       126                                                +----------+--------+--------+--------+------------------+--------+ +----------+--------+--------+----------------+-------------------+           PSV cm/sEDV cm/sDescribe        Arm Pressure (mmHG) +----------+--------+--------+----------------+-------------------+ [id number]             Multiphasic, WNL                    +----------+--------+--------+----------------+-------------------+ +---------+--------+--+--------+--+---------+ VertebralPSV cm/s54EDV cm/s13Antegrade +---------+--------+--+--------+--+---------+   Summary: Right Carotid: Velocities in the right ICA are consistent with a 1-39% stenosis. Left Carotid: Velocities in the left ICA are consistent with a 1-39%  stenosis. Vertebrals:  Bilateral vertebral arteries demonstrate antegrade flow. Subclavians: Normal flow hemodynamics were seen in bilateral subclavian              arteries.  Incidental: Appearance of diffusely enlarged, heterogenous thyroid. *See table(s) above for measurements and observations.  Preliminary         Scheduled Meds: .  stroke: mapping our early stages of recovery book   Does not apply Once  . [START ON 04/06/2020] aspirin  81 mg Oral Daily  . clopidogrel  75 mg Oral Daily   Continuous Infusions:   LOS: 0 days    Time spent: 39 minutes spent on chart review, discussion with nursing staff, consultants, updating family and interview/physical exam; more than 50% of that time was spent in counseling and/or coordination of care.    Alvira Philips [place], DO [facility] Available via Epic secure chat 7am-7pm After these hours, please refer to coverage provider listed on [email or web address] 04/05/2020, 5:41 PM

## 2020-04-05 NOTE — Progress Notes (Signed)
STROKE TEAM PROGRESS NOTE   INTERVAL HISTORY Her daughter is at the bedside. She had severe HA at home and treated w/ motrin, no other pills. Difficulty speaking and "not feeling right". No hx migraines. Her daughter has hx TIAs. Pt w/ reported dx Marfan's though no genetic testing.  I have personally obtained history,examined this patient, reviewed notes, independently viewed imaging studies, participated in medical decision making and plan of care     .  She presented with headache for 2 days and transient speech and expressive language difficulties which appear to have resolved.  MRI is negative for stroke but MRA shows moderate/severe focal stenosis within proximal to mid superior division left M2 MCA branch vessel  Vitals:   04/05/20 0400 04/05/20 0500 04/05/20 0649 04/05/20 0815  BP: (!) 152/78 (!) 149/63 (!) 142/66 (!) 147/67  Pulse: 60 62 61 61  Resp: 15 (!) 23 20 16   Temp:   97.6 F (36.4 C) 97.8 F (36.6 C)  TempSrc:   Oral Oral  SpO2: 97% 98% 95% 91%  Weight:      Height:       CBC:  Recent Labs  Lab 04/04/20 1957 04/04/20 2044  WBC 10.0  --   NEUTROABS 6.1  --   HGB 12.8 13.3  HCT 41.1 39.0  MCV 91.5  --   PLT 223  --    Basic Metabolic Panel:  Recent Labs  Lab 04/04/20 1957 04/04/20 2044  NA 140 141  K 3.9 3.8  CL 104 105  CO2 25  --   GLUCOSE 102* 94  BUN 9 10  CREATININE 0.78 0.70  CALCIUM 10.0  --    Lipid Panel:  Recent Labs  Lab 04/05/20 0512  CHOL 124  TRIG 96  HDL 37*  CHOLHDL 3.4  VLDL 19  LDLCALC 68   HgbA1c: No results for input(s): HGBA1C in the last 168 hours. Urine Drug Screen: No results for input(s): LABOPIA, COCAINSCRNUR, LABBENZ, AMPHETMU, THCU, LABBARB in the last 168 hours.  Alcohol Level No results for input(s): ETH in the last 168 hours.  IMAGING past 24 hours CT HEAD WO CONTRAST  Result Date: 04/04/2020 CLINICAL DATA:  Expressive aphasia EXAM: CT HEAD WITHOUT CONTRAST TECHNIQUE: Contiguous axial images were obtained  from the base of the skull through the vertex without intravenous contrast. COMPARISON:  None. FINDINGS: Brain: There is no mass, hemorrhage or extra-axial collection. The size and configuration of the ventricles and extra-axial CSF spaces are normal. There is hypoattenuation of the white matter, most commonly indicating chronic small vessel disease. Vascular: Atherosclerotic calcification of the vertebral and internal carotid arteries at the skull base. No abnormal hyperdensity of the major intracranial arteries or dural venous sinuses. Skull: The visualized skull base, calvarium and extracranial soft tissues are normal. Sinuses/Orbits: No fluid levels or advanced mucosal thickening of the visualized paranasal sinuses. No mastoid or middle ear effusion. The orbits are normal. IMPRESSION: Chronic small vessel disease without acute intracranial abnormality. Electronically Signed   By: 04/06/2020 M.D.   On: 04/04/2020 20:51   MR ANGIO HEAD WO CONTRAST  Result Date: 04/05/2020 CLINICAL DATA:  Transient ischemic attack (TIA). Additional history provided: Expressive aphasia. EXAM: MRA HEAD WITHOUT CONTRAST TECHNIQUE: Angiographic images of the Circle of Willis were obtained using MRA technique without intravenous contrast. COMPARISON:  Brain MRI 04/04/2020.  Head CT 04/04/2020. FINDINGS: The examination is moderately motion degraded, limiting evaluation for intracranial stenoses and for small aneurysms. The intracranial internal carotid arteries are  patent. The M1 middle cerebral arteries are patent without significant stenosis. No M2 proximal branch occlusion is identified. Atherosclerotic irregularity of the M2 and more distal MCA branch vessels bilaterally. Most notably, there is an apparent moderate/severe focal stenosis within a proximal to mid superior division left M2 MCA branch vessel (series 253, image 3). Moderate stenosis of an inferior defect proximal M2 right MCA branch vessel (series 253, image 17).  The anterior cerebral arteries are patent. Apparent severe stenosis within the proximal A2 right anterior cerebral artery (series 253, image 16). The visualized intracranial vertebral arteries are patent without significant stenosis. The left vertebral artery is dominant. The basilar artery is patent. The posterior cerebral arteries are patent proximally without significant stenosis. No intracranial aneurysm is identified. IMPRESSION: Moderately motion degraded examination, limiting evaluation for intracranial arterial stenoses and for small aneurysms. No intracranial large vessel occlusion. Intracranial atherosclerotic disease multifocal stenoses, most notably as follows. Apparent moderate/severe focal stenosis within a proximal to mid superior division left M2 MCA branch vessel. Apparent moderate stenosis of an inferior division proximal M2 right MCA branch vessel. Apparent severe stenosis within the proximal A2 right anterior cerebral artery. These stenoses could be accentuated by motion artifact on the current exam. Electronically Signed   By: Jackey Loge DO   On: 04/05/2020 10:27   MR BRAIN WO CONTRAST  Result Date: 04/05/2020 CLINICAL DATA:  Acute neurologic deficit EXAM: MRI HEAD WITHOUT CONTRAST TECHNIQUE: Multiplanar, multiecho pulse sequences of the brain and surrounding structures were obtained without intravenous contrast. COMPARISON:  None. FINDINGS: Brain: No acute infarct, acute hemorrhage or extra-axial collection. Early confluent hyperintense T2-weighted signal of the periventricular and deep white matter. There is generalized atrophy without lobar predilection. No chronic microhemorrhage. Normal midline structures. Vascular: Normal flow voids. Skull and upper cervical spine: Normal marrow signal. Sinuses/Orbits: Negative. Other: None. IMPRESSION: 1. No acute intracranial abnormality. 2. Generalized atrophy and findings of chronic ischemic microangiopathy. Electronically Signed   By: Deatra Robinson M.D.   On: 04/05/2020 00:02    PHYSICAL EXAM Pleasant elderly caucasian lady not in distress. . Afebrile. Head is nontraumatic. Neck is supple without bruit.    Cardiac exam no murmur or gallop. Lungs are clear to auscultation. Distal pulses are well felt. No marfanoid features like tall stature, long thin fingers. Neurological Exam ;  Awake  Alert oriented x 3. Normal speech and language.eye movements full without nystagmus.fundi were not visualized. Vision acuity and fields appear normal. Hearing is normal. Palatal movements are normal. Face symmetric. Tongue midline. Normal strength, tone, reflexes and coordination. Normal sensation. Gait deferred.  ASSESSMENT/PLAN [name] is a 74 y.o. female with history of HTN presenting with expressive aphasia in the setting of a severe HA.   L brain TIA in setting of small vessel disease   CT head No acute abnormality. Small vessel disease.   MRI  No acute abnormality. Small vessel disease. Atrophy.   MRA  Motion degraded. No LVO. Intracranial atherosclerosis: moderate / severe focal mid superior L M2 branch, moderate inferior proximal R M2, severe proximal R A2 stenoses  Carotid Doppler  pending   2D Echo pending   LDL 68  HgbA1c pending   VTE prophylaxis - SCDs ordered  No antithrombotic prior to admission, now on aspirin 81 mg daily and clopidogrel 75 mg daily. Continue DAPT x 3 months given intracranial atherosclerosis then aspirin alone.   Therapy recommendations:  pending   Disposition:  Return home Follow-up Stroke Clinic at [facility] in 4 weeks. Office will call with appointment date and time. Order placed.  Hypertension  Home meds:  Losartan 50, metoprolol   Stable . BP goal normotensive  Other Stroke Risk Factors  Advanced age  Cigarette smoker, advised to stop smoking  Obesity, Body mass index is 33.8 kg/m., recommend weight loss, diet and exercise as appropriate   Other  Active Problems  Pt reports hx Marfan's syndrome but not notable w/ physical assessment. Has not had genetic testing.    Hospital day # 0  She presented with transient speech difficulties in the setting of a headache for 24 hours unclear if this represents her typical migraine or left hemispheric TIA from small vessel disease.  Recommend dual antiplatelet therapy for 3 months given intracranial atherosclerosis followed by aspirin alone and aggressive risk factor modification.  Long discussion with the patient and daughter and answered questions.  Discussed with Dr. [place].  Greater than 50% time during this 35-minute visit was spent on counseling and coordination of care about TIA and intracranial atherosclerosis and answering questions. Delia Heady, MD  To contact Stroke Continuity provider, please refer to [email or web address]. After hours, contact General Neurology

## 2020-04-05 NOTE — Evaluation (Signed)
Occupational Therapy Evaluation Patient Details Name: Leslie Dean MRN: [id number] DOB: 1946/05/04 Today's Date: 04/05/2020    History of Present Illness 74 y.o. female with medical history significant for hypertension, scoliosis, hyperlipidemia, Marfan syndrome with subluxation of the left lens, recent fall with closed left hip fracture (s/p IM nail May 2021). Presented to the ED for concerns of expressive aphasia, generalized weakness, transient headache. CT and MRI head negative for acute infarct, suspect TIA. CT head did reveal chronic small vessel disease.   Clinical Impression   PTA pt living with daughter, functioning at mod I level for ADL and requiring as needed assist for IADL. At time of eval, pt is able to complete sit <> stands and mobility with min guard level of assist and RW. Reinforced RW usage, for daughter states pt does not like to use it at home. Noted no specific neurological deficits, pt mostly with generalized weakness. Educated pt and daughter on s/s of stroke with BE FAST. Reviewed fall prevention strategies to apply to pts home environment. Also gave pt information for community OT program that supports aging in place. Given current status, recommend HHOT at d/c. OT will sign off in anticipation for d/c this date.    Follow Up Recommendations  Home health OT;Supervision - Intermittent    Equipment Recommendations  None recommended by OT    Recommendations for Other Services       Precautions / Restrictions Precautions Precautions: Fall Restrictions Weight Bearing Restrictions: No      Mobility Bed Mobility Overal bed mobility: Modified Independent             General bed mobility comments: HOB elevated, +rail, increased time and effort    Transfers Overall transfer level: Needs assistance Equipment used: Rolling walker (2 wheeled) Transfers: Sit to/from Stand Sit to Stand: Min guard         General transfer comment: cues for hand  placement, increased time to power up    Balance Overall balance assessment: Needs assistance Sitting-balance support: No upper extremity supported;Feet supported Sitting balance-Leahy Scale: Good     Standing balance support: Bilateral upper extremity supported;During functional activity Standing balance-Leahy Scale: Poor Standing balance comment: reliant on external support                           ADL either performed or assessed with clinical judgement   ADL Overall ADL's : Needs assistance/impaired Eating/Feeding: Set up;Sitting   Grooming: Set up;Sitting   Upper Body Bathing: Set up;Sitting   Lower Body Bathing: Minimal assistance;Sit to/from stand;Sitting/lateral leans   Upper Body Dressing : Set up;Sitting   Lower Body Dressing: Minimal assistance;Sit to/from stand;Sitting/lateral leans   Toilet Transfer: Min guard;Ambulation;Regular Toilet;Grab bars;RW   Toileting- Clothing Manipulation and Hygiene: Set up;Sitting/lateral lean;Sit to/from stand       Functional mobility during ADLs: Min guard;Rolling walker       Vision Baseline Vision/History: Wears glasses Wears Glasses: At all times Patient Visual Report: No change from baseline Vision Assessment?: No apparent visual deficits     Perception     Praxis      Pertinent Vitals/Pain Pain Assessment: Faces Faces Pain Scale: Hurts little more Pain Location: back (chronic) Pain Descriptors / Indicators: Discomfort Pain Intervention(s): Limited activity within patient's tolerance;Monitored during session     Hand Dominance Right   Extremity/Trunk Assessment Upper Extremity Assessment Upper Extremity Assessment: Overall WFL for tasks assessed   Lower Extremity Assessment Lower Extremity Assessment:  Defer to PT evaluation LLE Deficits / Details: 4/5 grossly graded (fall with hip fx, s/p IM nail 10/2019)   Cervical / Trunk Assessment Cervical / Trunk Assessment: Other exceptions Cervical  / Trunk Exceptions: scoliosis   Communication Communication Communication: No difficulties   Cognition Arousal/Alertness: Awake/alert Behavior During Therapy: WFL for tasks assessed/performed Overall Cognitive Status: Within Functional Limits for tasks assessed                                     General Comments       Exercises     Shoulder Instructions      Home Living Family/patient expects to be discharged to:: Private residence Living Arrangements: Children Available Help at Discharge: Family;Available 24 hours/day Type of Home: House Home Access: Level entry     Home Layout: Two level;Able to live on main level with bedroom/bathroom Alternate Level Stairs-Number of Steps: 1 and 2 to get to [employer] Toilet: Handicapped height     Home Equipment: Environmental consultant - 2 wheels;Electric scooter;Bedside commode      Lives With: Daughter    Prior Functioning/Environment Level of Independence: Independent with assistive device(s)        Comments: RW vs cane, scooter outside at times        OT Problem List: Decreased strength;Decreased knowledge of use of DME or AE;Decreased activity tolerance;Impaired balance (sitting and/or standing)      OT Treatment/Interventions:      OT Goals(Current goals can be found in the care plan section) Acute Rehab OT Goals Patient Stated Goal: home OT Goal Formulation: All assessment and education complete, DC therapy  OT Frequency:     Barriers to D/C:            Co-evaluation              AM-PAC OT "6 Clicks" Daily Activity     Outcome Measure Help from another person eating meals?: None Help from another person taking care of personal grooming?: A Little Help from another person toileting, which includes using toliet, bedpan, or urinal?: A Little Help from another person bathing (including washing, rinsing, drying)?: A Little Help from another person to put on and taking off regular upper  body clothing?: None Help from another person to put on and taking off regular lower body clothing?: A Little 6 Click Score: 20   End of Session Equipment Utilized During Treatment: Gait belt;Rolling walker Nurse Communication: Mobility status  Activity Tolerance: Patient tolerated treatment well Patient left: in bed;with call bell/phone within reach;with family/visitor present  OT Visit Diagnosis: Other abnormalities of gait and mobility (R26.89);Muscle weakness (generalized) (M62.81)                Time: 8466-5993 OT Time Calculation (min): 23 min Charges:  OT General Charges $OT Visit: 1 Visit OT Evaluation $OT Eval Moderate Complexity: 1 Mod OT Treatments $Self Care/Home Management : 8-22 mins  Dalphine Handing, MSOT, OTR/L Acute Rehabilitation Services [facility] Office Number: [phone] Pager: [phone]  Dalphine Handing 04/05/2020, 2:12 PM

## 2020-04-05 NOTE — Evaluation (Addendum)
Physical Therapy Evaluation Patient Details Name: Leslie Dean MRN: [id number] DOB: 03/08/1946 Today's Date: 04/05/2020   History of Present Illness  74 y.o. female with medical history significant for hypertension, scoliosis, hyperlipidemia, Marfan syndrome with subluxation of the left lens, recent fall with closed left hip fracture (s/p IM nail May 2021). Presented to the ED for concerns of expressive aphasia, generalized weakness, transient headache. CT and MRI head negative for acute infarct, suspect TIA. CT head did reveal chronic small vessel disease.    Clinical Impression  PT eval complete. Pt required min guard assist transfers and ambulation 150' with RW. Pt presents with decreased strength, decreased balance, and decreased activity tolerance resulting in unsteady gait and increasing risk for falls.  Pt's daughter able to provide needed level of assist at home. Recommend HHPT to address core strength, LE strength, and balance. Plan is for d/c home today. PT signing off.    Follow Up Recommendations Home health PT;Supervision for mobility/OOB    Equipment Recommendations  None recommended by PT    Recommendations for Other Services       Precautions / Restrictions Precautions Precautions: Fall      Mobility  Bed Mobility Overal bed mobility: Modified Independent             General bed mobility comments: HOB elevated, +rail, increased time and effort    Transfers Overall transfer level: Needs assistance Equipment used: Rolling walker (2 wheeled) Transfers: Sit to/from Stand Sit to Stand: Min guard         General transfer comment: cues for hand placement, increased time to power up  Ambulation/Gait Ambulation/Gait assistance: Min guard Gait Distance (Feet): 150 Feet Assistive device: Rolling walker (2 wheeled) Gait Pattern/deviations: Step-through pattern;Trunk flexed;Drifts right/left Gait velocity: decreased Gait velocity interpretation: 1.31 -  2.62 ft/sec, indicative of limited community ambulator General Gait Details: cues for posture and to stay close to RW. Pt tends to drift to R.  Stairs            Wheelchair Mobility    Modified Rankin (Stroke Patients Only) Modified Rankin (Stroke Patients Only) Pre-Morbid Rankin Score: Slight disability Modified Rankin: Moderately severe disability     Balance Overall balance assessment: Needs assistance Sitting-balance support: No upper extremity supported;Feet supported Sitting balance-Leahy Scale: Good     Standing balance support: Bilateral upper extremity supported;During functional activity Standing balance-Leahy Scale: Poor Standing balance comment: reliant on external support                             Pertinent Vitals/Pain Pain Assessment: Faces Faces Pain Scale: Hurts little more Pain Location: back (chronic) Pain Descriptors / Indicators: Discomfort Pain Intervention(s): Monitored during session;Repositioned    Home Living Family/patient expects to be discharged to:: Private residence Living Arrangements: Children Available Help at Discharge: Family;Available 24 hours/day Type of Home: House Home Access: Level entry     Home Layout: Two level;Able to live on main level with bedroom/bathroom Home Equipment: Gilford Rile - 2 wheels;Electric scooter;Bedside commode      Prior Function Level of Independence: Independent with assistive device(s)         Comments: RW vs cane, scooter outside at times     Hand Dominance   Dominant Hand: Right    Extremity/Trunk Assessment   Upper Extremity Assessment Upper Extremity Assessment: Defer to OT evaluation    Lower Extremity Assessment Lower Extremity Assessment: LLE deficits/detail LLE Deficits / Details: 4/5 grossly graded (  fall with hip fx, s/p IM nail 10/2019)    Cervical / Trunk Assessment Cervical / Trunk Assessment: Other exceptions Cervical / Trunk Exceptions: scoliosis   Communication   Communication: No difficulties  Cognition Arousal/Alertness: Awake/alert Behavior During Therapy: WFL for tasks assessed/performed Overall Cognitive Status: Within Functional Limits for tasks assessed                                        General Comments      Exercises     Assessment/Plan    PT Assessment All further PT needs can be met in the next venue of care  PT Problem List Decreased strength;Decreased mobility;Decreased activity tolerance;Decreased balance;Pain       PT Treatment Interventions      PT Goals (Current goals can be found in the Care Plan section)  Acute Rehab PT Goals Patient Stated Goal: home PT Goal Formulation: All assessment and education complete, DC therapy    Frequency     Barriers to discharge        Co-evaluation               AM-PAC PT "6 Clicks" Mobility  Outcome Measure Help needed turning from your back to your side while in a flat bed without using bedrails?: None Help needed moving from lying on your back to sitting on the side of a flat bed without using bedrails?: A Little Help needed moving to and from a bed to a chair (including a wheelchair)?: A Little Help needed standing up from a chair using your arms (e.g., wheelchair or bedside chair)?: A Little Help needed to walk in hospital room?: A Little Help needed climbing 3-5 steps with a railing? : A Lot 6 Click Score: 18    End of Session Equipment Utilized During Treatment: Gait belt Activity Tolerance: Patient tolerated treatment well Patient left: in bed;with call bell/phone within reach;with family/visitor present Nurse Communication: Mobility status PT Visit Diagnosis: Difficulty in walking, not elsewhere classified (R26.2);Muscle weakness (generalized) (M62.81)    Time: 1110-1140 PT Time Calculation (min) (ACUTE ONLY): 30 min   Charges:   PT Evaluation $PT Eval Moderate Complexity: 1 Mod PT Treatments $Gait Training: 8-22  mins        Lorrin Goodell, PT  Office # [phone] Pager [phone]   Lorriane Shire 04/05/2020, 11:54 AM

## 2020-04-05 NOTE — Evaluation (Signed)
Speech Language Pathology Evaluation Patient Details Name: Leslie Dean MRN: [id number] DOB: 1946-04-24 Today's Date: 04/05/2020 Time: 1937-9024 SLP Time Calculation (min) (ACUTE ONLY): 18 min  Problem List:  Patient Active Problem List   Diagnosis Date Noted   TIA (transient ischemic attack) 04/05/2020   Pressure injury of skin 10/30/2019   Closed left hip fracture (HCC) 10/18/2019   Acute respiratory failure (HCC) 10/18/2019   Acute lower UTI 10/18/2019   Abdominal pain 12/03/2012   Hepatic cyst 12/03/2012   HTN (hypertension) 12/03/2012   Marfans syndrome 12/03/2012   Past Medical History:  Past Medical History:  Diagnosis Date   Hypertension    Scoliosis    Spinal stenosis    Past Surgical History:  Past Surgical History:  Procedure Laterality Date   CESAREAN SECTION     CHOLECYSTECTOMY N/A 12/04/2012   Procedure: LAPAROSCOPIC Liver Cyst Unroofing and removal of gallbladder;  Surgeon: Almond Lint, MD;  Location: [facility];  Service: General;  Laterality: N/A;   CYST EXCISION     liver cyst removal   EYE SURGERY     FEMUR IM NAIL Left 10/18/2019   Procedure: INTRAMEDULLARY (IM) NAIL FEMORAL;  Surgeon: Durene Romans, MD;  Location: [facility];  Service: Orthopedics;  Laterality: Left;   HPI:  [name] is a 74 y.o. female with medical history significant for hypertension, scoliosis, hyperlipidemia, Marfan syndrome with subluxation of the left lens, recent fall with closed left hip fracture. Presented to the ED for concerns of expressive aphasia, generalized weakness, transient headache. CT and MRI head negative for acute infarct, suspect TIA. CT head did reveal chronic small vessel disease.   Assessment / Plan / Recommendation Clinical Impression  Pt was seen for SLE and was alert and cooperative. The Western Aphasia Battery-Bedside was administered and she received a score of 60/60. All areas assessed were found to be [facility]. Pt's safety and judgment was  also assessed and found to be WNL. The SLP provided pt with stroke education. Given pt's level of functioning and family suppport available, no SLP f/u is recommended at this time.    SLP Assessment  SLP Recommendation/Assessment: Patient does not need any further Speech Lanaguage Pathology Services SLP Visit Diagnosis: Cognitive communication deficit (R41.841)    Follow Up Recommendations       Frequency and Duration           SLP Evaluation Cognition  Overall Cognitive Status: Within Functional Limits for tasks assessed Arousal/Alertness: Awake/alert Orientation Level: Oriented to person;Oriented to place;Oriented to situation Attention: Focused;Sustained Focused Attention: Appears intact Sustained Attention: Appears intact Memory: Appears intact Awareness: Appears intact Problem Solving: Appears intact Executive Function: Decision Making Decision Making: Appears intact Safety/Judgment: Appears intact       Comprehension  Auditory Comprehension Overall Auditory Comprehension: Appears within functional limits for tasks assessed Yes/No Questions: Within Functional Limits Commands: Within Functional Limits Conversation: Simple Visual Recognition/Discrimination Discrimination: Not tested Reading Comprehension Reading Status: Not tested    Expression Expression Primary Mode of Expression: Verbal Verbal Expression Overall Verbal Expression: Appears within functional limits for tasks assessed Initiation: No impairment Automatic Speech: Name;Social Response Repetition: No impairment Naming: No impairment Pragmatics: No impairment Written Expression Dominant Hand: Right Written Expression: Not tested   Oral / Motor  Motor Speech Overall Motor Speech: Appears within functional limits for tasks assessed Respiration: Within functional limits Phonation: Normal Resonance: Within functional limits Articulation: Within functional limitis Intelligibility:  Intelligible Motor Planning: Not tested Motor Speech Errors: Not applicable  GO                    Royetta Crochet 04/05/2020, 9:36 AM

## 2020-04-05 NOTE — H&P (Signed)
History and Physical   Leslie Dean GYI:[id number] DOB: [birth year] DOA: 04/04/2020  PCP: Patient, No Pcp Per  Patient coming from: Home  I have personally briefly reviewed patient's old medical records in [facility] EMR.  Chief Concern: Expressive aphasia  HPI: Leslie Dean is a 74 y.o. female with medical history significant for hypertension, scoliosis, hyperlipidemia, Marfan syndrome with subluxation of the left lens, recent fall with closed left hip fracture, presents to the emergency department for chief concerns of expressive aphasia.  Patient reports that on Monday evening, 04/03/2020 she experienced generalized profound weakness and exhaustion and left frontal headache that she describes was an 8 out of 10.  She went to bed and frequently woke up that evening with a headache.  All throughout Tuesday, 04/05/19 21 she felt her weakness and exhaustion persisted.  Her daughter came home from work Tuesday evening and noticed that her mother had expressive aphasia with some facial drooping.  Patient reports that she does not know she experienced expressive aphasia or had facial drooping as she is at home by herself with the dog on Tuesday.  Both daughter and patient reports that currently the expressive aphasia has improved significantly.  Daughter states that she is not as quick as she is normally however she is able to respond much better than when daughter came home from work which was concerning and prompting ED evaluation.  Patient reports that her headache is completely gone now.  She states that she has never had these symptoms before.  The remainder of review of system was negative for vision changes, nausea, vomiting, chest pain, shortness of breath, abdominal pain, diarrhea, constipation, dysuria, hematuria, difficulty walking, balance, dizziness, lightheadedness.  She endorses insomnia in the last 2 nights.  She endorsed baseline urinary incontinence.  Family history: Patient  does not know a family history of stroke. Social history: Patient lives at home by herself.  She was a former tobacco user, quit smoking in May 2021 after the hip fracture.  She states she used to smoke a pack a day.  Denies alcohol use.  Denies recreational drug use.  ED Course: Discussed with ED provider.  Neurology specialist was consulted and recommends medicine admission for TIA work-up.  Review of Systems: As per HPI otherwise 10 point review of systems negative.  Past Medical History:  Diagnosis Date  . Hypertension   . Scoliosis   . Spinal stenosis    Past Surgical History:  Procedure Laterality Date  . CESAREAN SECTION    . CHOLECYSTECTOMY N/A 12/04/2012   Procedure: LAPAROSCOPIC Liver Cyst Unroofing and removal of gallbladder;  Surgeon: Almond Lint, MD;  Location: [facility];  Service: General;  Laterality: N/A;  . CYST EXCISION     liver cyst removal  . EYE SURGERY    . FEMUR IM NAIL Left 10/18/2019   Procedure: INTRAMEDULLARY (IM) NAIL FEMORAL;  Surgeon: Durene Romans, MD;  Location: [facility];  Service: Orthopedics;  Laterality: Left;   Social History:  reports that she has been smoking. She has been smoking about 1.00 pack per day. She has never used smokeless tobacco. She reports that she does not drink alcohol and does not use drugs.  Allergies  Allergen Reactions  . Other     Says narcotics cause nausea for her   Family History  Problem Relation Age of Onset  . Marfan syndrome Grandchild    Family history: Family history reviewed and patient is not aware of family history of stroke  Prior  to Admission medications   Medication Sig Start Date End Date Taking? Authorizing Provider  Calcium Carb-Cholecalciferol (CALCIUM-VITAMIN D) 600-400 MG-UNIT TABS Take 1 tablet by mouth daily.   Yes [provider]  Cholecalciferol (VITAMIN D3) 50 MCG (2000 UT) capsule Take 2,000 Units by mouth daily.   Yes [provider]  ferrous sulfate 325 (65 FE) MG tablet  Take 325 mg by mouth daily with breakfast.   Yes [provider]  furosemide (LASIX) 20 MG tablet Take 20 mg by mouth daily. 09/10/19  Yes [provider]  losartan (COZAAR) 50 MG tablet Take 50 mg by mouth daily.    Yes [provider]  METOPROLOL SUCCINATE ER PO Take 1 tablet by mouth daily.   Yes [provider]   Physical Exam: Vitals:   04/04/20 1948 04/04/20 2001 04/04/20 2033 04/04/20 2225  BP:  (!) 164/96 (!) 153/63 (!) 151/68  Pulse:  84 68 64  Resp:  16 16 (!) 21  Temp:  98.1 F (36.7 C)    TempSrc:  Oral    SpO2:  95% 94% 99%  Weight: 95 kg     Height: 5\' 6"  (1.676 m)      Constitutional: NAD, calm, comfortable Eyes: PERRL, lids and conjunctivae normal ENMT: Mucous membranes are moist. Posterior pharynx clear of any exudate or lesions.Normal dentition.  Neck: normal, supple, no masses, no thyromegaly Respiratory: clear to auscultation bilaterally, no wheezing, no crackles. Normal respiratory effort. No accessory muscle use.  Cardiovascular: Regular rate and rhythm, no murmurs / rubs / gallops. 3+ pitting bilateral lower extremity edema. 2+ pedal pulses. No carotid bruits.  Abdomen: obese abdomen, no tenderness, no masses palpated. No hepatosplenomegaly. Bowel sounds positive.  Musculoskeletal: no clubbing / cyanosis. No joint deformity upper and lower extremities. Good ROM, no contractures. Normal muscle tone.  Skin: no rashes, lesions, ulcers. No induration Neurologic: CN 2-12 grossly intact. Sensation intact, DTR normal. Strength 5/5 in all 4.  Psychiatric: Normal judgment and insight. Alert and oriented x 3. Normal mood.   Labs on Admission: I have personally reviewed following labs and imaging studies  CBC: Recent Labs  Lab 04/04/20 1957 04/04/20 2044  WBC 10.0  --   NEUTROABS 6.1  --   HGB 12.8 13.3  HCT 41.1 39.0  MCV 91.5  --   PLT 223  --    Basic Metabolic Panel: Recent Labs  Lab 04/04/20 1957 04/04/20 2044  NA 140  141  K 3.9 3.8  CL 104 105  CO2 25  --   GLUCOSE 102* 94  BUN 9 10  CREATININE 0.78 0.70  CALCIUM 10.0  --    GFR: Estimated Creatinine Clearance: 71.7 mL/min (by C-G formula based on SCr of 0.7 mg/dL). Liver Function Tests: Recent Labs  Lab 04/04/20 1957  AST 14*  ALT 11  ALKPHOS 72  BILITOT 1.4*  PROT 6.7  ALBUMIN 3.6   Coagulation Profile: Recent Labs  Lab 04/04/20 1957  INR 1.0   Urine analysis:    Component Value Date/Time   COLORURINE YELLOW 10/18/2019 0853   APPEARANCEUR CLEAR 10/18/2019 0853   LABSPEC 1.015 10/18/2019 0853   PHURINE 5.0 10/18/2019 0853   GLUCOSEU NEGATIVE 10/18/2019 0853   HGBUR NEGATIVE 10/18/2019 0853   BILIRUBINUR NEGATIVE 10/18/2019 0853   KETONESUR NEGATIVE 10/18/2019 0853   PROTEINUR NEGATIVE 10/18/2019 0853   UROBILINOGEN 1.0 12/02/2012 2304   NITRITE POSITIVE (A) 10/18/2019 0853   LEUKOCYTESUR TRACE (A) 10/18/2019 0853   Radiological Exams  on Admission: Personally reviewed and I agree with radiologist reading as below.  CT HEAD WO CONTRAST  Result Date: 04/04/2020 CLINICAL DATA:  Expressive aphasia EXAM: CT HEAD WITHOUT CONTRAST TECHNIQUE: Contiguous axial images were obtained from the base of the skull through the vertex without intravenous contrast. COMPARISON:  None. FINDINGS: Brain: There is no mass, hemorrhage or extra-axial collection. The size and configuration of the ventricles and extra-axial CSF spaces are normal. There is hypoattenuation of the white matter, most commonly indicating chronic small vessel disease. Vascular: Atherosclerotic calcification of the vertebral and internal carotid arteries at the skull base. No abnormal hyperdensity of the major intracranial arteries or dural venous sinuses. Skull: The visualized skull base, calvarium and extracranial soft tissues are normal. Sinuses/Orbits: No fluid levels or advanced mucosal thickening of the visualized paranasal sinuses. No mastoid or middle ear effusion. The  orbits are normal. IMPRESSION: Chronic small vessel disease without acute intracranial abnormality. Electronically Signed   By: Deatra Robinson M.D.   On: 04/04/2020 20:51   MR BRAIN WO CONTRAST  Result Date: 04/05/2020 CLINICAL DATA:  Acute neurologic deficit EXAM: MRI HEAD WITHOUT CONTRAST TECHNIQUE: Multiplanar, multiecho pulse sequences of the brain and surrounding structures were obtained without intravenous contrast. COMPARISON:  None. FINDINGS: Brain: No acute infarct, acute hemorrhage or extra-axial collection. Early confluent hyperintense T2-weighted signal of the periventricular and deep white matter. There is generalized atrophy without lobar predilection. No chronic microhemorrhage. Normal midline structures. Vascular: Normal flow voids. Skull and upper cervical spine: Normal marrow signal. Sinuses/Orbits: Negative. Other: None. IMPRESSION: 1. No acute intracranial abnormality. 2. Generalized atrophy and findings of chronic ischemic microangiopathy. Electronically Signed   By: Deatra Robinson M.D.   On: 04/05/2020 00:02   EKG: Independently reviewed, showing sinus rhythm with a rate of 78, QTC of 510, right bundle branch block status on previous EKG  Assessment/Plan  Principal Problem:   TIA (transient ischemic attack) Active Problems:   HTN (hypertension)   Marfans syndrome   Closed left hip fracture (HCC)   Expressive aphasia-suspect TIA -Neurologist has been consulted -CT of the head without contrast was negative for hemorrhagic stroke -MRI of the head was negative for acute intracranial abnormality; generalized atrophy and findings of chronic ischemic microangiography -Per neurologist, loading with aspirin 324 mg once, aspirin 81 mg daily (starting 04/06/20), and clopidogrel 75 mg daily for 3 weeks (starting 10/28/210 -Outpatient follow-up with neurology -Echo ordered -Allow for permissive hypertension in the first 24 hours with blood pressure goal of less than 220/110, and  depending on what A1c is will determine goal blood pressure at that time -Fall precautions, aspiration precautions -A1c, fasting lipid check -If LDL is greater than 70, we will start statin therapy -Admit to medicine observation with telemetry to monitor for arrhythmia -PT/OT/SLP consult -Stroke education  Hypertension-elevated -We will allow for permissive hypertension at this time -Holding home furosemide 20 mg daily, losartan 50 mg daily, metoprolol succinate daily -Goal blood pressure will be dependent on hemoglobin A1c results  Closed left hip fracture-status post open reduction, internal fixation on 10/18/2019 -Patient is able to ambulate with assistance -Fall precautions  Marfan syndrome Scoliosis  DVT prophylaxis: Holding this time, aspirin 324 loading dose Code Status: limited, she does not want CPR for more than 20 minutes. She declines intubation Diet: N.p.o. pending speech evaluation Family Communication: Daughter at bedside Disposition Plan: Pending clinical course Consults called: Neurology Admission status: Observation with telemetry  Margarette Vannatter N Sophia Cubero D.O. [facility]  If 7AM-7PM, please contact day-coverage  provider [email or web address]  04/05/2020, 2:34 AM

## 2020-04-05 NOTE — Progress Notes (Signed)
Carotid duplex bilateral study completed.   Please see CV Proc for preliminary results.   Jayce Kainz, RDMS  

## 2020-04-05 NOTE — Plan of Care (Signed)
  Problem: Education: Goal: Knowledge of disease or condition will improve Outcome: Progressing Goal: Knowledge of secondary prevention will improve Outcome: Progressing   Problem: Self-Care: Goal: Ability to participate in self-care as condition permits will improve Outcome: Progressing Goal: Verbalization of feelings and concerns over difficulty with self-care will improve Outcome: Progressing   

## 2020-04-05 NOTE — ED Notes (Signed)
RN attempted to call report was told to call back in 5 minutes

## 2020-04-05 NOTE — ED Provider Notes (Signed)
I assumed care in signout to follow-up on MRI brain.  Patient came in for stroke evaluation.  Patient had reported headache and difficulty speaking. MRI is negative.  Patient reports dysarthria persists.  Consulted neurology Dr. Thomasena Edis who recommends TIA work-up.  Discussed with Dr. Sedalia Muta for admission   EKG Interpretation  Date/Time:  Tuesday April 04 2020 19:56:11 EDT Ventricular Rate:  78 PR Interval:  172 QRS Duration: 126 QT Interval:  448 QTC Calculation: 510 R Axis:   86 Text Interpretation: Normal sinus rhythm Non-specific intra-ventricular conduction block T wave abnormality, consider anterior ischemia Abnormal ECG Interpretation limited secondary to artifact Confirmed by Zadie Rhine ([id number]) on 04/04/2020 11:22:57 PM         Zadie Rhine, MD 04/05/20 0136

## 2020-04-05 NOTE — Progress Notes (Signed)
OT Cancellation Note  Patient Details Name: Leslie Dean MRN: [id number] DOB: November 10, 1945   Cancelled Treatment:    Reason Eval/Treat Not Completed: Active bedrest order;Patient at procedure or test/ unavailable Pt with active bed rest order, plan to go to MRI this AM. OT will hold until bed rest order lifted and pt available and appropriate.   Dalphine Handing, MSOT, OTR/L Acute Rehabilitation Services [facility] Office Number: [phone] Pager: [phone]  Dalphine Handing 04/05/2020, 8:32 AM

## 2020-04-05 NOTE — Progress Notes (Addendum)
PT Cancellation Note  Patient Details Name: Leslie Dean MRN: [id number] DOB: Dec 04, 1945   Cancelled Treatment:    Reason Eval/Treat Not Completed: Active bedrest order. Please update activity order, when appropriate, for PT to proceed with eval. Thank you.  [id number] addendum: Pt in now off bedrest. Attempted PT eval. Pt transporting to MRI. PT to re-attempt as time allows.   Ilda Foil 04/05/2020, 7:45 AM  Aida Raider, PT  Office # [phone] Pager [phone]

## 2020-04-05 NOTE — Consult Note (Addendum)
Neurology H&P  CC: transient speech difficulty  History is obtained from: patient and daughter  HPI: Leslie Dean is a 74 y.o. 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 She lives alone and woke with same headache which persisted throughout the day and described as left frontal from behind her left eye radiating to the center of her forehead was constant and 8/10. When her daughter came to visit her around 70 today they both noticed that she was having difficulty getting words out. The patient new what she wanted to say but the words came out wrong. As time progressed the patient slowly returned to baseline and came in for further evaluation. Currently her headache is gone.  She has never had these symptoms before. Denies focal numbness/weakness, acute visual or hearing changes.   Recently fell and had left femoral fracture and has some difficulty moving her left leg and ambulates with assistive device.   LKW: last night. tpa given?: No outside window IR Thrombectomy? No Modified Rankin Scale: 0-Completely asymptomatic and back to baseline post- stroke NIHSS: 0  ROS: A complete ROS was performed and is negative except as noted in the HPI.  Past Medical History:  Diagnosis Date  . Hypertension   . Scoliosis   . Spinal stenosis    Family History  Problem Relation Age of Onset  . Marfan syndrome Grandchild    Social History:  reports that she has been smoking. She has been smoking about 1.00 pack per day. She has never used smokeless tobacco. She reports that she does not drink alcohol and does not use drugs.  Prior to Admission medications   Medication Sig Start Date End Date Taking? Authorizing Provider  Calcium Carb-Cholecalciferol (CALCIUM-VITAMIN D) 600-400 MG-UNIT TABS Take 1 tablet by mouth daily.   Yes [provider]  Cholecalciferol (VITAMIN D3) 50 MCG (2000 UT) capsule Take 2,000 Units by mouth daily.    Yes [provider]  ferrous sulfate 325 (65 FE) MG tablet Take 325 mg by mouth daily with breakfast.   Yes [provider]  furosemide (LASIX) 20 MG tablet Take 20 mg by mouth daily. 09/10/19  Yes [provider]  losartan (COZAAR) 50 MG tablet Take 50 mg by mouth daily.    Yes [provider]  METOPROLOL SUCCINATE ER PO Take 1 tablet by mouth daily.   Yes [provider]   Exam: Current vital signs: BP (!) 151/68   Pulse 64   Temp 98.1 F (36.7 C) (Oral)   Resp (!) 21   Ht 5\' 6"  (1.676 m)   Wt 95 kg   SpO2 99%   BMI 33.80 kg/m   Physical Exam  Constitutional: Appears well-developed and well-nourished.  Psych: Affect appropriate to situation Eyes: Conjunctival injection of left eye. HENT: Red and swollen left palpebra No OP obstrucion Head: Normocephalic.  Cardiovascular: Normal rate and regular rhythm.  Respiratory: Effort normal and breath sounds normal to anterior ascultation GI: Soft.  No distension. There is no tenderness.  Skin: WDI  Neuro: Mental Status: Patient is awake, alert, oriented to person, place, month, year, and situation. Patient is able to give a clear and coherent history. No signs of aphasia or neglect Cranial Nerves: II: Visual Fields are full. Pupils are equal, round, and reactive to light.   III,IV, VI: EOMI without ptosis or diploplia.  V: Facial sensation is symmetric to temperature VII: Facial movement is symmetric.  VIII: hearing is  intact to voice X: Uvula elevates symmetrically XI: Shoulder shrug is symmetric. XII: tongue is midline without atrophy or fasciculations.  Motor: Tone is normal. Bulk is normal. 5/5 strength was present in all four extremities.  Sensory: Sensation is symmetric to light touch and temperature in the arms and legs. Deep Tendon Reflexes: 2+ and symmetric in the biceps and patellae.  Plantars: Toes are downgoing bilaterally.  Cerebellar: FNF and HKS are intact  bilaterally  I have reviewed the images obtained: NCT head did not show acute ischemic changes.  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 She does have vasculare risk factors and needs further TIA workup for risk factors.  Impression:  TIA - transient aphasia. HTN. Marfan's syndrome as virtually all patients have abnormal cardiovascular system (aortic root dilatation, dissections and valvular abnormalities). Left periorbital and palpebral edema - Chronic related Marfan lens subluxation.  Plan: - Brain MRI.  - Recommend vascular imaging with MRA head and carotid doppler. - Recommend TTE - Recommend ordering Lipid panel. - Recommend Statin if LDL > 70 - Recommend obtaining HbA1c - Aspirin 81mg  daily. - Clopidogrel 75mg  daily for 3 weeks. - SBP goal - permissive hypertension first 24 h < 220/110. Hold home meds.  - Preservative-free eye - Telemetry monitoring for arrhythmia. - Recommend bedside Swallow screen - Recommend Stroke education. - Recommend PT/OT/SLP consult. - Preservative-free eyedrops 4 times daily.  Electronically signed by: Dr. Pager: [phone] 04/05/2020, 2:01 AM

## 2020-04-05 NOTE — TOC Initial Note (Signed)
Transition of Care [facility]) - Initial/Assessment Note    Patient Details  Name: Leslie Dean MRN: [id number] Date of Birth: April 14, 1946  Transition of Care [facility]) CM/SW Contact:    Pollie Friar, RN Phone Number: 04/05/2020, 3:53 PM  Clinical Narrative:                 CM met with the patient and her daughter. Recommendations are for [facility] services. Pt has used [facility] in the past and would like to use them again. Cory with [facility] accepted the referral. Pt states her family lives very close and she has someone at her home a lot of the time. She feels she has the support she needs.  Family able to provide transportation home when medically ready.  Expected Discharge Plan: [place] Barriers to Discharge: Continued Medical Work up   Patient Goals and CMS Choice   CMS Medicare.gov Compare Post Acute Care list provided to:: Patient Choice offered to / list presented to : Patient  Expected Discharge Plan and Services Expected Discharge Plan: [place]   Discharge Planning Services: CM Consult Post Acute Care Choice: [place] arrangements for the past 2 months: [place] Arranged: PT, OT [place] Agency: [place] Date [facility] Agency Contacted: 04/05/20   Representative spoke with at [place]: Tommi Rumps  Prior Living Arrangements/Services Living arrangements for the past 2 months: [place] Lives with:: Self Patient language and need for interpreter reviewed:: Yes Do you feel safe going back to the place where you live?: Yes      Need for Family Participation in Patient Care: Yes (Comment) Care giver support system in place?: Yes (comment) Current home services: DME (wheelchair/ walker/ shower seat/ 3 in 1) Criminal Activity/Legal Involvement Pertinent to Current Situation/Hospitalization: No - Comment as needed  Activities of Daily Living      Permission Sought/Granted                   Emotional Assessment Appearance:: Appears stated age Attitude/Demeanor/Rapport: Engaged Affect (typically observed): Accepting Orientation: : Oriented to Self, Oriented to Place, Oriented to  Time, Oriented to Situation   Psych Involvement: No (comment)  Admission diagnosis:  TIA (transient ischemic attack) [G45.9] Patient Active Problem List   Diagnosis Date Noted  . TIA (transient ischemic attack) 04/05/2020  . Pressure injury of skin 10/30/2019  . Closed left hip fracture ([place]) 10/18/2019  . Acute respiratory failure ([place]) 10/18/2019  . Acute lower UTI 10/18/2019  . Abdominal pain 12/03/2012  . Hepatic cyst 12/03/2012  . HTN (hypertension) 12/03/2012  . [place] syndrome 12/03/2012   PCP:  Patient, No Pcp Per Pharmacy:   [id number] [place] [id number]: [phone]: [phone] [place] [address]Phone: [phone]: [phone]    Social Determinants of Health (SDOH) Interventions    Readmission Risk Interventions No flowsheet data found.

## 2020-04-06 ENCOUNTER — Observation Stay (HOSPITAL_BASED_OUTPATIENT_CLINIC_OR_DEPARTMENT_OTHER): Payer: PPO

## 2020-04-06 DIAGNOSIS — G459 Transient cerebral ischemic attack, unspecified: Secondary | ICD-10-CM

## 2020-04-06 DIAGNOSIS — I10 Essential (primary) hypertension: Secondary | ICD-10-CM | POA: Diagnosis not present

## 2020-04-06 LAB — ECHOCARDIOGRAM COMPLETE
Area-P 1/2: 3.77 cm2
Height: 66 in
S' Lateral: 3.4 cm
Weight: 3350.99 oz

## 2020-04-06 LAB — HEMOGLOBIN A1C
Hgb A1c MFr Bld: 5.8 % — ABNORMAL HIGH (ref 4.8–5.6)
Mean Plasma Glucose: 120 mg/dL

## 2020-04-06 MED ORDER — ASPIRIN 81 MG PO CHEW: 81.0000 mg | CHEWABLE_TABLET | Freq: Every day | ORAL | 0 refills | Status: AC

## 2020-04-06 MED ORDER — CLOPIDOGREL BISULFATE 75 MG PO TABS: 75.0000 mg | ORAL_TABLET | Freq: Every day | ORAL | 0 refills | Status: AC

## 2020-04-06 NOTE — Progress Notes (Signed)
Pt discharge instruction given. Pt verbalized understanding. Pt belongings taken with patient as well.

## 2020-04-06 NOTE — Progress Notes (Signed)
  Echocardiogram 2D Echocardiogram has been performed.  Delcie Roch 04/06/2020, 11:41 AM

## 2020-04-06 NOTE — Discharge Summary (Signed)
Physician Discharge Summary  Leslie Dean:[id number] DOB: 24-Mar-1946 DOA: 04/04/2020  PCP: Patient, No Pcp Per  Admit date: 04/04/2020 Discharge date: 04/06/2020  Admitted From: Home Disposition: Home  Recommendations for Outpatient Follow-up:  1. Follow up with PCP in 1-2 weeks 2. Follow-up with [facility] in 4 weeks 3. Continue aspirin 81 mg p.o. daily 4. Plavix 75 mg p.o. daily x3 months 5. Will need outpatient follow-up/surveillance for mild aortic root dilation  Home Health: PT/OT Equipment/Devices: None  Discharge Condition: Stable CODE STATUS: DNI Diet recommendation: Heart healthy/consistent carbohydrate diet  History of present illness:  Leslie Dean is a 74 year old female with past medical history notable for essential hypertension, scoliosis, hyperlipidemia, reported diagnosis of Marfan syndrome with subluxation of a left lens, recent fall with closed left hip fracture who presented to the emergency department with expressive aphasia.  Onset Monday evening 04/03/2020 with associated profound weakness, exhaustion and left frontal headache.  When her daughter returned on 04/04/2020, noted mother had expressive aphasia with some facial drooping.  In the ED, her symptoms of expressive aphasia, headache and weakness have almost completely resolved.  CT head without contrast negative for acute intracranial abnormality.  Neurology was consulted, hospitalist service consulted for further evaluation and treatment for likely TIA.  Hospital course:  Expressive aphasia: Resolved Transient ischemic attack Patient presenting with expressive aphasia, headache, weakness.  CT head without contrast without acute intracranial abnormality.  MR brain with no acute intracranial normality but does note generalized atrophy with chronic ischemic microangiopathy.  MRA, motion degraded but no LVO, intracranial atherosclerosis with moderate/severe focal mid superior  L M2 branch, moderate inferior proximal R M2, severe proximal R A2 stenosis.  Vascular duplex ultrasound bilateral carotids with 1-39% stenosis bilateral ICA.  Patient was seen by speech therapy, no deficits appreciated.  Total cholesterol 124, HDL 37, LDL 68.  Hemoglobin A1c 5.8.  Seen by PT/OT with recommendation for home health.  Neurology followed during the hospital course recommended dual antiplatelet therapy with Plavix 75 mg p.o. daily x3 months followed by aspirin 81 mg p.o. daily alone.  Follow-up with [facility] in 4 weeks.  Essential hypertension Continue home losartan 50 mg p.o. daily, furosemide and Metoprolol succinate   History of closed left hip fracture Status post ORIF 10/18/2019.  Patient able to ambulate with assistance.  Discharge with home health PT/OT.  Outpatient follow-up with orthopedics as needed.  Marfan syndrome Scoliosis Outpatient follow-up  Mild aortic root dilation Noted on echocardiogram. Will need follow-up and continue surveillance outpatient.  Discharge Diagnoses:  Principal Problem:   TIA (transient ischemic attack) Active Problems:   HTN (hypertension)   Marfans syndrome   Closed left hip fracture [facility])    Discharge Instructions  Discharge Instructions    Ambulatory referral to Neurology   Complete by: As directed    Follow up in stroke clinic at [facility] with Ihor Austin, NP in about 4 weeks. If not available, consider Dr. Delia Heady, Dr. Jamelle Rushing, or Dr. Naomie Dean.   Call MD for:  difficulty breathing, headache or visual disturbances   Complete by: As directed    Call MD for:  extreme fatigue   Complete by: As directed    Call MD for:  persistant dizziness or light-headedness   Complete by: As directed    Call MD for:  persistant nausea and vomiting   Complete by: As directed    Call MD for:  severe uncontrolled pain   Complete by:  As directed    Call MD for:  temperature  >100.4   Complete by: As directed    Diet - low sodium heart healthy   Complete by: As directed    Increase activity slowly   Complete by: As directed      Allergies as of 04/06/2020      Reactions   Other    Says narcotics cause nausea for her      Medication List    TAKE these medications   aspirin 81 MG chewable tablet Chew 1 tablet (81 mg total) by mouth daily. Start taking on: April 07, 2020   Calcium-Vitamin D 600-400 MG-UNIT Tabs Take 1 tablet by mouth daily.   clopidogrel 75 MG tablet Commonly known as: PLAVIX Take 1 tablet (75 mg total) by mouth daily. Start taking on: April 07, 2020   ferrous sulfate 325 (65 FE) MG tablet Take 325 mg by mouth daily with breakfast.   furosemide 20 MG tablet Commonly known as: LASIX Take 20 mg by mouth daily.   losartan 50 MG tablet Commonly known as: COZAAR Take 50 mg by mouth daily.   METOPROLOL SUCCINATE ER PO Take 1 tablet by mouth daily.   Vitamin D3 50 MCG (2000 UT) capsule Take 2,000 Units by mouth daily.       Follow-up Information    [facility] Follow up in 4 week(s).   Specialty: Neurology Why: stroke clinic. ofifce will call with appt date and time.  Contact information: [address] [place] [address] [phone]       [facility] Follow up.   Specialty: Home Health Services Why: The home health agency will contact you for the first home visit. Contact information: [address] [place] [address] [phone]              Allergies  Allergen Reactions  . Other     Says narcotics cause nausea for her    Consultations:  Neurology   Procedures/Studies: CT HEAD WO CONTRAST  Result Date: 04/04/2020 CLINICAL DATA:  Expressive aphasia EXAM: CT HEAD WITHOUT CONTRAST TECHNIQUE: Contiguous axial images were obtained from the base of the skull through the vertex without intravenous contrast. COMPARISON:   None. FINDINGS: Brain: There is no mass, hemorrhage or extra-axial collection. The size and configuration of the ventricles and extra-axial CSF spaces are normal. There is hypoattenuation of the white matter, most commonly indicating chronic small vessel disease. Vascular: Atherosclerotic calcification of the vertebral and internal carotid arteries at the skull base. No abnormal hyperdensity of the major intracranial arteries or dural venous sinuses. Skull: The visualized skull base, calvarium and extracranial soft tissues are normal. Sinuses/Orbits: No fluid levels or advanced mucosal thickening of the visualized paranasal sinuses. No mastoid or middle ear effusion. The orbits are normal. IMPRESSION: Chronic small vessel disease without acute intracranial abnormality. Electronically Signed   By: Deatra Robinson M.D.   On: 04/04/2020 20:51   MR ANGIO HEAD WO CONTRAST  Result Date: 04/05/2020 CLINICAL DATA:  Transient ischemic attack (TIA). Additional history provided: Expressive aphasia. EXAM: MRA HEAD WITHOUT CONTRAST TECHNIQUE: Angiographic images of the Circle of Willis were obtained using MRA technique without intravenous contrast. COMPARISON:  Brain MRI 04/04/2020.  Head CT 04/04/2020. FINDINGS: The examination is moderately motion degraded, limiting evaluation for intracranial stenoses and for small aneurysms. The intracranial internal carotid arteries are patent. The M1 middle cerebral arteries are patent without significant stenosis. No M2 proximal branch  occlusion is identified. Atherosclerotic irregularity of the M2 and more distal MCA branch vessels bilaterally. Most notably, there is an apparent moderate/severe focal stenosis within a proximal to mid superior division left M2 MCA branch vessel (series 253, image 3). Moderate stenosis of an inferior defect proximal M2 right MCA branch vessel (series 253, image 17). The anterior cerebral arteries are patent. Apparent severe stenosis within the proximal  A2 right anterior cerebral artery (series 253, image 16). The visualized intracranial vertebral arteries are patent without significant stenosis. The left vertebral artery is dominant. The basilar artery is patent. The posterior cerebral arteries are patent proximally without significant stenosis. No intracranial aneurysm is identified. IMPRESSION: Moderately motion degraded examination, limiting evaluation for intracranial arterial stenoses and for small aneurysms. No intracranial large vessel occlusion. Intracranial atherosclerotic disease multifocal stenoses, most notably as follows. Apparent moderate/severe focal stenosis within a proximal to mid superior division left M2 MCA branch vessel. Apparent moderate stenosis of an inferior division proximal M2 right MCA branch vessel. Apparent severe stenosis within the proximal A2 right anterior cerebral artery. These stenoses could be accentuated by motion artifact on the current exam. Electronically Signed   By: Jackey Loge DO   On: 04/05/2020 10:27   MR BRAIN WO CONTRAST  Result Date: 04/05/2020 CLINICAL DATA:  Acute neurologic deficit EXAM: MRI HEAD WITHOUT CONTRAST TECHNIQUE: Multiplanar, multiecho pulse sequences of the brain and surrounding structures were obtained without intravenous contrast. COMPARISON:  None. FINDINGS: Brain: No acute infarct, acute hemorrhage or extra-axial collection. Early confluent hyperintense T2-weighted signal of the periventricular and deep white matter. There is generalized atrophy without lobar predilection. No chronic microhemorrhage. Normal midline structures. Vascular: Normal flow voids. Skull and upper cervical spine: Normal marrow signal. Sinuses/Orbits: Negative. Other: None. IMPRESSION: 1. No acute intracranial abnormality. 2. Generalized atrophy and findings of chronic ischemic microangiopathy. Electronically Signed   By: Deatra Robinson M.D.   On: 04/05/2020 00:02   ECHOCARDIOGRAM COMPLETE  Result Date:  04/06/2020    ECHOCARDIOGRAM REPORT   Patient Name:   KISCHA ALTICE Date of Exam: 04/06/2020 Medical Rec #:  [id number]       Height:       66.0 in Accession #:    [id number]      Weight:       209.4 lb Date of Birth:  1945/11/11      BSA:          2.039 m Patient Age:    74 years        BP:           155/74 mmHg Patient Gender: F               HR:           69 bpm. Exam Location:  Inpatient Procedure: 2D Echo Indications:    stroke 434.91  History:        Patient has no prior history of Echocardiogram examinations.                 Marfans syndrome; Risk Factors:Hypertension and Dyslipidemia.  Sonographer:    Delcie Roch Referring Phys: [id number] AMY N COX IMPRESSIONS  1. Left ventricular ejection fraction, by estimation, is 55 to 60%. The left ventricle has normal function. The left ventricle has no regional wall motion abnormalities. There is mild left ventricular hypertrophy. Left ventricular diastolic parameters are consistent with Grade I diastolic dysfunction (impaired relaxation).  2. Right ventricular systolic function is normal. The right ventricular size is  normal. There is normal pulmonary artery systolic pressure. The estimated right ventricular systolic pressure is 23.2 mmHg.  3. Left atrial size was mildly dilated.  4. The mitral valve is normal in structure. Trivial mitral valve regurgitation. No evidence of mitral stenosis.  5. The aortic valve is tricuspid. Aortic valve regurgitation is mild. No aortic stenosis is present.  6. Aortic dilatation noted. There is mild dilatation of the ascending aorta, measuring 37 mm.  7. The inferior vena cava is normal in size with greater than 50% respiratory variability, suggesting right atrial pressure of 3 mmHg. FINDINGS  Left Ventricle: Left ventricular ejection fraction, by estimation, is 55 to 60%. The left ventricle has normal function. The left ventricle has no regional wall motion abnormalities. The left ventricular internal cavity size was normal  in size. There is  mild left ventricular hypertrophy. Left ventricular diastolic parameters are consistent with Grade I diastolic dysfunction (impaired relaxation). Right Ventricle: The right ventricular size is normal. No increase in right ventricular wall thickness. Right ventricular systolic function is normal. There is normal pulmonary artery systolic pressure. The tricuspid regurgitant velocity is 2.25 m/s, and  with an assumed right atrial pressure of 3 mmHg, the estimated right ventricular systolic pressure is 23.2 mmHg. Left Atrium: Left atrial size was mildly dilated. Right Atrium: Right atrial size was normal in size. Pericardium: There is no evidence of pericardial effusion. Mitral Valve: The mitral valve is normal in structure. Mild mitral annular calcification. Trivial mitral valve regurgitation. No evidence of mitral valve stenosis. Tricuspid Valve: The tricuspid valve is normal in structure. Tricuspid valve regurgitation is trivial. Aortic Valve: The aortic valve is tricuspid. Aortic valve regurgitation is mild. No aortic stenosis is present. Pulmonic Valve: The pulmonic valve was normal in structure. Pulmonic valve regurgitation is not visualized. Aorta: Aortic dilatation noted. There is mild dilatation of the ascending aorta, measuring 37 mm. Venous: The inferior vena cava is normal in size with greater than 50% respiratory variability, suggesting right atrial pressure of 3 mmHg. IAS/Shunts: No atrial level shunt detected by color flow Doppler.  LEFT VENTRICLE PLAX 2D LVIDd:         5.00 cm  Diastology LVIDs:         3.40 cm  LV e' medial:    5.33 cm/s LV PW:         1.40 cm  LV E/e' medial:  15.5 LV IVS:        1.30 cm  LV e' lateral:   6.20 cm/s LVOT diam:     2.00 cm  LV E/e' lateral: 13.3 LV SV:         74 LV SV Index:   36 LVOT Area:     3.14 cm  RIGHT VENTRICLE             IVC RV S prime:     14.40 cm/s  IVC diam: 2.20 cm TAPSE (M-mode): 1.9 cm LEFT ATRIUM             Index       RIGHT ATRIUM            Index LA diam:        4.50 cm 2.21 cm/m  RA Area:     10.80 cm LA Vol (A2C):   51.1 ml 25.06 ml/m RA Volume:   22.40 ml  10.98 ml/m LA Vol (A4C):   65.1 ml 31.92 ml/m LA Biplane Vol: 57.2 ml 28.05 ml/m  AORTIC VALVE LVOT Vmax:  130.00 cm/s LVOT Vmean:  80.400 cm/s LVOT VTI:    0.235 m  AORTA Ao Root diam: 3.50 cm Ao Asc diam:  3.70 cm MITRAL VALVE                TRICUSPID VALVE MV Area (PHT): 3.77 cm     TR Peak grad:   20.2 mmHg MV Decel Time: 201 msec     TR Vmax:        225.00 cm/s MV E velocity: 82.60 cm/s MV A velocity: 103.00 cm/s  SHUNTS MV E/A ratio:  0.80         Systemic VTI:  0.24 m                             Systemic Diam: 2.00 cm Marca Ancona MD Electronically signed by Marca Ancona MD Signature Date/Time: 04/06/2020/4:48:17 PM    Final    VAS US CAROTID  Result Date: 04/06/2020 Carotid Arterial Duplex Study Indications:       TIA. Risk Factors:      Hypertension, hyperlipidemia. Comparison Study:  No prior studies. Performing Technologist: Jean Rosenthal, RDMS  Examination Guidelines: A complete evaluation includes B-mode imaging, spectral Doppler, color Doppler, and power Doppler as needed of all accessible portions of each vessel. Bilateral testing is considered an integral part of a complete examination. Limited examinations for reoccurring indications may be performed as noted.  Right Carotid Findings: +----------+--------+--------+--------+------------------+--------+           PSV cm/sEDV cm/sStenosisPlaque DescriptionComments +----------+--------+--------+--------+------------------+--------+ CCA Prox  73      11              heterogenous               +----------+--------+--------+--------+------------------+--------+ CCA Distal65      12                                         +----------+--------+--------+--------+------------------+--------+ ICA Prox  103     17      1-39%   heterogenous                +----------+--------+--------+--------+------------------+--------+ ICA Distal71      14                                         +----------+--------+--------+--------+------------------+--------+ ECA       137     18                                         +----------+--------+--------+--------+------------------+--------+ +----------+--------+-------+----------------+-------------------+           PSV cm/sEDV cmsDescribe        Arm Pressure (mmHG) +----------+--------+-------+----------------+-------------------+ [id number]            Multiphasic, WNL                    +----------+--------+-------+----------------+-------------------+ +---------+--------+--+--------+-+---------+ VertebralPSV cm/s31EDV cm/s8Antegrade +---------+--------+--+--------+-+---------+  Left Carotid Findings: +----------+--------+--------+--------+------------------+--------+           PSV cm/sEDV cm/sStenosisPlaque DescriptionComments +----------+--------+--------+--------+------------------+--------+ CCA Prox  100     13                                         +----------+--------+--------+--------+------------------+--------+  CCA Distal63      15                                         +----------+--------+--------+--------+------------------+--------+ ICA Prox  59      11      1-39%   heterogenous               +----------+--------+--------+--------+------------------+--------+ ICA Distal72      14                                         +----------+--------+--------+--------+------------------+--------+ ECA       126                                                +----------+--------+--------+--------+------------------+--------+ +----------+--------+--------+----------------+-------------------+           PSV cm/sEDV cm/sDescribe        Arm Pressure (mmHG) +----------+--------+--------+----------------+-------------------+ Subclavian212              Multiphasic, WNL                    +----------+--------+--------+----------------+-------------------+ +---------+--------+--+--------+--+---------+ VertebralPSV cm/s54EDV cm/s13Antegrade +---------+--------+--+--------+--+---------+   Summary: Right Carotid: Velocities in the right ICA are consistent with a 1-39% stenosis. Left Carotid: Velocities in the left ICA are consistent with a 1-39% stenosis. Vertebrals:  Bilateral vertebral arteries demonstrate antegrade flow. Subclavians: Normal flow hemodynamics were seen in bilateral subclavian              arteries.  Incidental: Appearance of diffusely enlarged, heterogenous thyroid. *See table(s) above for measurements and observations.  Electronically signed by Delia Heady MD on 04/06/2020 at 1:36:43 PM.    Final      Subjective: Patient seen and examined bedside, resting comfortably sitting up edge of bed.  No complaints this morning.  Wishes to discharge home.  Denies headache, no visual changes, no difficulty with speech, no chest pain, no palpitations, no abdominal pain, no fever/chills/night sweats, no nausea/vomiting/diarrhea, no abdominal pain, no weakness, no fatigue, no paresthesias.  No acute events overnight per nursing staff.  Discharge Exam: Vitals:   04/06/20 1153 04/06/20 1541  BP: (!) 153/66 (!) 159/66  Pulse: 74 77  Resp: 18 18  Temp: 98.1 F (36.7 C) 98 F (36.7 C)  SpO2: 96% 95%   Vitals:   04/06/20 0357 04/06/20 0827 04/06/20 1153 04/06/20 1541  BP: (!) 162/74 (!) 155/74 (!) 153/66 (!) 159/66  Pulse: 79 66 74 77  Resp: 17 18 18 18   Temp: 97.7 F (36.5 C) 97.6 F (36.4 C) 98.1 F (36.7 C) 98 F (36.7 C)  TempSrc: Oral Oral Oral Oral  SpO2: 93% 95% 96% 95%  Weight:      Height:        General: Pt is alert, awake, not in acute distress Cardiovascular: RRR, S1/S2 +, no rubs, no gallops Respiratory: CTA bilaterally, no wheezing, no rhonchi Abdominal: Soft, NT, ND, bowel sounds + Extremities: no edema,  no cyanosis    The results of significant diagnostics from this hospitalization (including imaging, microbiology, ancillary and laboratory) are listed below for reference.     Microbiology: Recent Results (from the past  240 hour(s))  Resp Panel by RT PCR (RSV, Flu A&B, Covid) - Nasopharyngeal Swab     Status: None   Collection Time: 04/05/20  6:21 AM   Specimen: Nasopharyngeal Swab  Result Value Ref Range Status   SARS Coronavirus 2 by RT PCR NEGATIVE NEGATIVE Final    Comment: (NOTE) SARS-CoV-2 target nucleic acids are NOT DETECTED.  The SARS-CoV-2 RNA is generally detectable in upper respiratoy specimens during the acute phase of infection. The lowest concentration of SARS-CoV-2 viral copies this assay can detect is 131 copies/mL. A negative result does not preclude SARS-Cov-2 infection and should not be used as the sole basis for treatment or other patient management decisions. A negative result may occur with  improper specimen collection/handling, submission of specimen other than nasopharyngeal swab, presence of viral mutation(s) within the areas targeted by this assay, and inadequate number of viral copies (<131 copies/mL). A negative result must be combined with clinical observations, patient history, and epidemiological information. The expected result is Negative.  Fact Sheet for Patients:  https://www.moore.com/  Fact Sheet for Healthcare Providers:  https://www.young.biz/  This test is no t yet approved or cleared by the [place] FDA and  has been authorized for detection and/or diagnosis of SARS-CoV-2 by FDA under an Emergency Use Authorization (EUA). This EUA will remain  in effect (meaning this test can be used) for the duration of the COVID-19 declaration under Section 564(b)(1) of the Act, 21 U.S.C. section 360bbb-3(b)(1), unless the authorization is terminated or revoked sooner.     Influenza A by PCR NEGATIVE  NEGATIVE Final   Influenza B by PCR NEGATIVE NEGATIVE Final    Comment: (NOTE) The Xpert Xpress SARS-CoV-2/FLU/RSV assay is intended as an aid in  the diagnosis of influenza from Nasopharyngeal swab specimens and  should not be used as a sole basis for treatment. Nasal washings and  aspirates are unacceptable for Xpert Xpress SARS-CoV-2/FLU/RSV  testing.  Fact Sheet for Patients: https://www.moore.com/  Fact Sheet for Healthcare Providers: https://www.young.biz/  This test is not yet approved or cleared by the [place] FDA and  has been authorized for detection and/or diagnosis of SARS-CoV-2 by  FDA under an Emergency Use Authorization (EUA). This EUA will remain  in effect (meaning this test can be used) for the duration of the  Covid-19 declaration under Section 564(b)(1) of the Act, 21  U.S.C. section 360bbb-3(b)(1), unless the authorization is  terminated or revoked.    Respiratory Syncytial Virus by PCR NEGATIVE NEGATIVE Final    Comment: (NOTE) Fact Sheet for Patients: https://www.moore.com/  Fact Sheet for Healthcare Providers: https://www.young.biz/  This test is not yet approved or cleared by the [place] FDA and  has been authorized for detection and/or diagnosis of SARS-CoV-2 by  FDA under an Emergency Use Authorization (EUA). This EUA will remain  in effect (meaning this test can be used) for the duration of the  COVID-19 declaration under Section 564(b)(1) of the Act, 21 U.S.C.  section 360bbb-3(b)(1), unless the authorization is terminated or  revoked. Performed at [facility], [address] [place], [place] [address]      Labs: BNP (last 3 results) Recent Labs    10/18/19 1115  BNP 175.4*   Basic Metabolic Panel: Recent Labs  Lab 04/04/20 1957 04/04/20 2044 04/05/20 2300  NA 140 141 143  K 3.9 3.8 4.0  CL 104 105 109  CO2 25  --  25  GLUCOSE 102* 94  92  BUN 9 10  10  CREATININE 0.78 0.70 0.83  CALCIUM 10.0  --  9.6   Liver Function Tests: Recent Labs  Lab 04/04/20 1957  AST 14*  ALT 11  ALKPHOS 72  BILITOT 1.4*  PROT 6.7  ALBUMIN 3.6   No results for input(s): LIPASE, AMYLASE in the last 168 hours. No results for input(s): AMMONIA in the last 168 hours. CBC: Recent Labs  Lab 04/04/20 1957 04/04/20 2044  WBC 10.0  --   NEUTROABS 6.1  --   HGB 12.8 13.3  HCT 41.1 39.0  MCV 91.5  --   PLT 223  --    Cardiac Enzymes: No results for input(s): CKTOTAL, CKMB, CKMBINDEX, TROPONINI in the last 168 hours. BNP: Invalid input(s): POCBNP CBG: No results for input(s): GLUCAP in the last 168 hours. D-Dimer No results for input(s): DDIMER in the last 72 hours. Hgb A1c Recent Labs    04/05/20 0512  HGBA1C 5.8*   Lipid Profile Recent Labs    04/05/20 0512  CHOL 124  HDL 37*  LDLCALC 68  TRIG 96  CHOLHDL 3.4   Thyroid function studies No results for input(s): TSH, T4TOTAL, T3FREE, THYROIDAB in the last 72 hours.  Invalid input(s): FREET3 Anemia work up No results for input(s): VITAMINB12, FOLATE, FERRITIN, TIBC, IRON, RETICCTPCT in the last 72 hours. Urinalysis    Component Value Date/Time   COLORURINE YELLOW 10/18/2019 0853   APPEARANCEUR CLEAR 10/18/2019 0853   LABSPEC 1.015 10/18/2019 0853   PHURINE 5.0 10/18/2019 0853   GLUCOSEU NEGATIVE 10/18/2019 0853   HGBUR NEGATIVE 10/18/2019 0853   BILIRUBINUR NEGATIVE 10/18/2019 0853   KETONESUR NEGATIVE 10/18/2019 0853   PROTEINUR NEGATIVE 10/18/2019 0853   UROBILINOGEN 1.0 12/02/2012 2304   NITRITE POSITIVE (A) 10/18/2019 0853   LEUKOCYTESUR TRACE (A) 10/18/2019 0853   Sepsis Labs Invalid input(s): PROCALCITONIN,  WBC,  LACTICIDVEN Microbiology Recent Results (from the past 240 hour(s))  Resp Panel by RT PCR (RSV, Flu A&B, Covid) - Nasopharyngeal Swab     Status: None   Collection Time: 04/05/20  6:21 AM   Specimen: Nasopharyngeal Swab  Result Value Ref  Range Status   SARS Coronavirus 2 by RT PCR NEGATIVE NEGATIVE Final    Comment: (NOTE) SARS-CoV-2 target nucleic acids are NOT DETECTED.  The SARS-CoV-2 RNA is generally detectable in upper respiratoy specimens during the acute phase of infection. The lowest concentration of SARS-CoV-2 viral copies this assay can detect is 131 copies/mL. A negative result does not preclude SARS-Cov-2 infection and should not be used as the sole basis for treatment or other patient management decisions. A negative result may occur with  improper specimen collection/handling, submission of specimen other than nasopharyngeal swab, presence of viral mutation(s) within the areas targeted by this assay, and inadequate number of viral copies (<131 copies/mL). A negative result must be combined with clinical observations, patient history, and epidemiological information. The expected result is Negative.  Fact Sheet for Patients:  https://www.moore.com/  Fact Sheet for Healthcare Providers:  https://www.young.biz/  This test is no t yet approved or cleared by the [place] FDA and  has been authorized for detection and/or diagnosis of SARS-CoV-2 by FDA under an Emergency Use Authorization (EUA). This EUA will remain  in effect (meaning this test can be used) for the duration of the COVID-19 declaration under Section 564(b)(1) of the Act, 21 U.S.C. section 360bbb-3(b)(1), unless the authorization is terminated or revoked sooner.     Influenza A by PCR NEGATIVE NEGATIVE Final   Influenza  B by PCR NEGATIVE NEGATIVE Final    Comment: (NOTE) The Xpert Xpress SARS-CoV-2/FLU/RSV assay is intended as an aid in  the diagnosis of influenza from Nasopharyngeal swab specimens and  should not be used as a sole basis for treatment. Nasal washings and  aspirates are unacceptable for Xpert Xpress SARS-CoV-2/FLU/RSV  testing.  Fact Sheet for  Patients: https://www.moore.com/https://www.fda.gov/media/142436/download  Fact Sheet for Healthcare Providers: https://www.young.biz/https://www.fda.gov/media/142435/download  This test is not yet approved or cleared by the [place] FDA and  has been authorized for detection and/or diagnosis of SARS-CoV-2 by  FDA under an Emergency Use Authorization (EUA). This EUA will remain  in effect (meaning this test can be used) for the duration of the  Covid-19 declaration under Section 564(b)(1) of the Act, 21  U.S.C. section 360bbb-3(b)(1), unless the authorization is  terminated or revoked.    Respiratory Syncytial Virus by PCR NEGATIVE NEGATIVE Final    Comment: (NOTE) Fact Sheet for Patients: https://www.moore.com/https://www.fda.gov/media/142436/download  Fact Sheet for Healthcare Providers: https://www.young.biz/https://www.fda.gov/media/142435/download  This test is not yet approved or cleared by the [place] FDA and  has been authorized for detection and/or diagnosis of SARS-CoV-2 by  FDA under an Emergency Use Authorization (EUA). This EUA will remain  in effect (meaning this test can be used) for the duration of the  COVID-19 declaration under Section 564(b)(1) of the Act, 21 U.S.C.  section 360bbb-3(b)(1), unless the authorization is terminated or  revoked. Performed at [facility], [address] [place], [place] [address]      Time coordinating discharge: Over 30 minutes  SIGNED:   Alvira PhilipsEric J [place], DO  [facility] 04/06/2020, 5:42 PM

## 2020-04-06 NOTE — Discharge Instructions (Signed)
Transient Ischemic Attack A transient ischemic attack (TIA) is a "warning stroke" that causes stroke-like symptoms that then go away quickly. The symptoms of a TIA come on suddenly, and they last less than 24 hours. Unlike a stroke, a TIA does not cause permanent damage to the brain. It is important to know the symptoms of a TIA and what to do. Seek medical care right away, even if your symptoms go away. Having a TIA is a sign that you are at higher risk for a permanent stroke. Lifestyle changes and medical treatments can help prevent a stroke. What are the causes? This condition is caused by a temporary blockage in an artery in the head or neck. The blockage does not allow the brain to get the blood supply it needs and can cause various symptoms. The blockage can be caused by:  Fatty buildup in an artery in the head or neck (atherosclerosis).  A blood clot.  Tearing of an artery (dissection).  Inflammation of an artery (vasculitis). Sometimes the cause is not known. What increases the risk? Certain factors may make you more likely to develop this condition. Some of these factors are things that you can change, such as:  Obesity.  Using products that contain nicotine or tobacco, such as cigarettes and e-cigarettes.  Taking oral birth control, especially if you also use tobacco.  Lack of physical activity.  Excessive use of alcohol.  Use of drugs, especially cocaine and methamphetamine. Other risk factors include:  High blood pressure (hypertension).  High cholesterol.  Diabetes mellitus.  Heart disease (coronary artery disease).  Atrial fibrillation.  Being over the age of 60.  Being female.  Family history of stroke.  Previous history of blood clots, stroke, TIA, or heart attack.  Sickle cell disease.  Being a woman with a history of preeclampsia.  Migraine headache.  Sleep apnea.  Chronic inflammatory diseases, such as rheumatoid arthritis or lupus.  Blood  clotting disorders (hypercoagulable state). What are the signs or symptoms? Symptoms of this condition are the same as those of a stroke, but they are temporary. The symptoms develop suddenly, and they go away quickly, usually within minutes to hours. Symptoms may include sudden:  Weakness or numbness in your face, arm, or leg, especially on one side of your body.  Trouble walking or difficulty moving your arms or legs.  Trouble speaking, understanding speech, or both (aphasia).  Vision changes in one or both eyes. These include double vision, blurred vision, or loss of vision.  Dizziness.  Confusion.  Loss of balance or coordination.  Nausea and vomiting.  Severe headache with no known cause. If possible, make note of the exact time that you last felt like your normal self and what time your symptoms started. Tell your health care provider. How is this diagnosed? This condition may be diagnosed based on:  Your symptoms and medical history.  A physical exam.  Imaging tests, usually a CT or MRI scan of the brain.  Blood tests. You may also have other tests, including:  Electrocardiogram (ECG).  Echocardiogram.  Carotid ultrasound.  A scan of the brain circulation (CT angiogram or MRI angiogram).  Continuous heart monitoring. How is this treated? The goal of treatment is to reduce the risk for a subsequent stroke. Treatment may include stroke prevention therapies such as:  Changes to diet or lifestyle to decrease your risk. Lifestyle changes may include exercising and stopping smoking.  Medicines to thin the blood (antiplatelets or anticoagulants).  Blood pressure medicines.    Medicines to reduce cholesterol.  Treating other health conditions, such as diabetes or atrial fibrillation. If testing shows that you have narrowing in the arteries to your brain, your health care provider may recommend a procedure, such as:  Carotid endarterectomy. This is a surgery to  remove the blockage from your artery.  Carotid angioplasty and stenting. This is a procedure to open or widen an artery in the neck using a metal mesh tube (stent). The stent helps keep the artery open by supporting the artery walls. Follow these instructions at home: Medicines  Take over-the-counter and prescription medicines only as told by your health care provider.  If you were told to take a medicine to thin your blood, such as aspirin or an anticoagulant, take it exactly as told by your health care provider. ? Taking too much blood-thinning medicine can cause bleeding. ? If you do not take enough blood-thinning medicine, you will not have the protection that you need against a stroke and other problems. Eating and drinking   Eat 5 or more servings of fruits and vegetables each day.  Follow instructions from your health care provider about diet. You may need to follow a certain nutrition plan to help manage risk factors for stroke, such as high blood pressure, high cholesterol, diabetes, or obesity. This may include: ? Eating a low-fat, low-salt diet. ? Including a lot of fiber in your diet. ? Limiting the amount of carbohydrates and sugar in your diet.  Limit alcohol intake to no more than 1 drink a day for nonpregnant women and 2 drinks a day for men. One drink equals 12 oz of beer, 5 oz of wine, or 1 oz of hard liquor. General instructions  Maintain a healthy weight.  Stay physically active. Try to get at least 30 minutes of exercise on most or all days.  Find out if you have sleep apnea, and seek treatment if needed.  Do not use any products that contain nicotine or tobacco, such as cigarettes and e-cigarettes. If you need help quitting, ask your health care provider.  Do not abuse drugs.  Keep all follow-up visits as told by your health care provider. This is important. Where to find more information  American Stroke Association: www.strokeassociation.org  National  Stroke Association: www.stroke.org Get help right away if:  You have chest pain or an irregular heartbeat.  You have any symptoms of stroke. The acronym BEFAST is an easy way to remember the main warning signs of stroke. ? B - Balance problems. Signs include dizziness, sudden trouble walking, or loss of balance. ? E - Eye problems. This includes trouble seeing or a sudden change in vision. ? F - Face changes. This includes sudden weakness or numbness of the face, or the face or eyelid drooping to one side. ? A - Arm weakness or numbness. This happens suddenly and usually on one side of the body. ? S - Speech problems. This includes trouble speaking or trouble understanding speech. ? T - Time. Time to call 911 or seek emergency care. Do not wait to see if symptoms will go away. Make note of the time your symptoms started.  Other signs of stroke may include: ? A sudden, severe headache with no known cause. ? Nausea or vomiting. ? Seizure. These symptoms may represent a serious problem that is an emergency. Do not wait to see if the symptoms will go away. Get medical help right away. Call your local emergency services (911 in the [place] Do   not drive yourself to the hospital. Summary  A TIA happens when an artery in the head or neck is blocked, leading to stroke-like symptoms that then go away quickly. The blockage clears before there is any permanent brain damage. A TIA is a medical emergency and requires immediate medical attention.  Symptoms of this condition are the same as those of a stroke, but they are temporary. The symptoms usually develop suddenly, and they go away quickly, usually within minutes to hours.  Having a TIA means that you are at high risk of a stroke in the near future.  Treatment may include medicines to thin the blood as well as medicines, diet changes, and lifestyle changes to manage conditions that increase the risk of another TIA or a stroke. This information is not  intended to replace advice given to you by your health care provider. Make sure you discuss any questions you have with your health care provider. Document Revised: 12/04/2018 Document Reviewed: 12/04/2018 Elsevier Patient Education  2020 Elsevier Inc.  

## 2020-04-06 NOTE — Progress Notes (Signed)
STROKE TEAM PROGRESS NOTE   INTERVAL HISTORY Patient is sitting up in bed.  She is doing well.  She has no complaints.  Carotid ultrasound is unremarkable.  Echocardiogram is being done.  She has no complaints.  Neuro exam unchanged.  Vital signs stable.  Vitals:   04/05/20 2344 04/06/20 0357 04/06/20 0827 04/06/20 1153  BP: (!) 160/75 (!) 162/74 (!) 155/74 (!) 153/66  Pulse: 66 79 66 74  Resp: 18 17 18 18   Temp: 98 F (36.7 C) 97.7 F (36.5 C) 97.6 F (36.4 C) 98.1 F (36.7 C)  TempSrc: Oral Oral Oral Oral  SpO2: 94% 93% 95% 96%  Weight:      Height:       CBC:  Recent Labs  Lab 04/04/20 1957 04/04/20 2044  WBC 10.0  --   NEUTROABS 6.1  --   HGB 12.8 13.3  HCT 41.1 39.0  MCV 91.5  --   PLT 223  --    Basic Metabolic Panel:  Recent Labs  Lab 04/04/20 1957 04/04/20 1957 04/04/20 2044 04/05/20 2300  NA 140   < > 141 143  K 3.9   < > 3.8 4.0  CL 104   < > 105 109  CO2 25  --   --  25  GLUCOSE 102*   < > 94 92  BUN 9   < > 10 10  CREATININE 0.78   < > 0.70 0.83  CALCIUM 10.0  --   --  9.6   < > = values in this interval not displayed.   Lipid Panel:  Recent Labs  Lab 04/05/20 0512  CHOL 124  TRIG 96  HDL 37*  CHOLHDL 3.4  VLDL 19  LDLCALC 68   HgbA1c:  Recent Labs  Lab 04/05/20 0512  HGBA1C 5.8*   Urine Drug Screen: No results for input(s): LABOPIA, COCAINSCRNUR, LABBENZ, AMPHETMU, THCU, LABBARB in the last 168 hours.  Alcohol Level No results for input(s): ETH in the last 168 hours.  IMAGING past 24 hours No results found.  PHYSICAL EXAM  Pleasant elderly caucasian lady not in distress. . Afebrile. Head is nontraumatic. Neck is supple without bruit.    Cardiac exam no murmur or gallop. Lungs are clear to auscultation. Distal pulses are well felt. No marfanoid features like tall stature, long thin fingers. Neurological Exam ;  Awake  Alert oriented x 3. Normal speech and language.eye movements full without nystagmus.fundi were not visualized.  Vision acuity and fields appear normal. Hearing is normal. Palatal movements are normal. Face symmetric. Tongue midline. Normal strength, tone, reflexes and coordination. Normal sensation. Gait deferred.  ASSESSMENT/PLAN [name] is a 74 y.o. female with history of HTN presenting with expressive aphasia in the setting of a severe HA.   L brain TIA in setting of small vessel disease with intracranial atherosclerosis  CT head No acute abnormality. Small vessel disease.   MRI  No acute abnormality. Small vessel disease. Atrophy.   MRA  Motion degraded. No LVO. Intracranial atherosclerosis: moderate / severe focal mid superior L M2 branch, moderate inferior proximal R M2, severe proximal R A2 stenoses  Carotid Doppler  B ICA 1-39% stenosis, VAs antegrade    2D Echo pending   LDL 68  HgbA1c 5.8  VTE prophylaxis - SCDs ordered  No antithrombotic prior to admission, now on aspirin 81 mg daily and clopidogrel 75 mg daily. Continue DAPT x 3 months given intracranial atherosclerosis then aspirin alone.   Therapy  recommendations:  HH PT, HH OT  Disposition:  Return home Follow-up Stroke Clinic at [facility] in 4 weeks. Office will call with appointment date and time. Order placed.  Hypertension  Home meds:  Losartan 50, metoprolol   Stable . BP goal normotensive  Other Stroke Risk Factors  Advanced age  Cigarette smoker, advised to stop smoking  Obesity, Body mass index is 33.8 kg/m., recommend weight loss, diet and exercise as appropriate   Other Active Problems  Pt reports hx Marfan's syndrome but not notable w/ physical assessment. Has not had genetic testing.    Hospital day # 0  Continue ongoing stroke work-up and check echocardiogram.  Aspirin and Plavix for 3 months followed by aspirin alone.  Aggressive risk factor modification.  Stroke team will sign off.  Follow-up as an outpatient stroke clinic in 6 weeks.  Kindly call for  questions. Delia Heady, MD  To contact Stroke Continuity provider, please refer to [email or web address]. After hours, contact General Neurology

## 2020-04-26 ENCOUNTER — Ambulatory Visit (INDEPENDENT_AMBULATORY_CARE_PROVIDER_SITE_OTHER): Payer: PPO | Admitting: Family Medicine

## 2020-04-26 ENCOUNTER — Other Ambulatory Visit: Payer: Self-pay

## 2020-04-26 ENCOUNTER — Encounter: Payer: Self-pay | Admitting: Family Medicine

## 2020-04-26 VITALS — BP 104/60 | HR 103 | Ht 66.0 in | Wt 188.0 lb

## 2020-04-26 DIAGNOSIS — R300 Dysuria: Secondary | ICD-10-CM | POA: Diagnosis not present

## 2020-04-26 DIAGNOSIS — I10 Essential (primary) hypertension: Secondary | ICD-10-CM

## 2020-04-26 DIAGNOSIS — Z780 Asymptomatic menopausal state: Secondary | ICD-10-CM

## 2020-04-26 DIAGNOSIS — Z0000 Encounter for general adult medical examination without abnormal findings: Secondary | ICD-10-CM | POA: Diagnosis not present

## 2020-04-26 DIAGNOSIS — G459 Transient cerebral ischemic attack, unspecified: Secondary | ICD-10-CM | POA: Diagnosis not present

## 2020-04-26 LAB — POCT UA - MICROSCOPIC ONLY

## 2020-04-26 LAB — POCT URINALYSIS DIP (MANUAL ENTRY)
Bilirubin, UA: NEGATIVE
Blood, UA: NEGATIVE
Glucose, UA: NEGATIVE mg/dL
Ketones, POC UA: NEGATIVE mg/dL
Nitrite, UA: NEGATIVE
Protein Ur, POC: NEGATIVE mg/dL
Spec Grav, UA: 1.025 (ref 1.010–1.025)
Urobilinogen, UA: 0.2 E.U./dL
pH, UA: 5.5 (ref 5.0–8.0)

## 2020-04-26 MED ORDER — CEPHALEXIN 500 MG PO CAPS: 500.0000 mg | ORAL_CAPSULE | Freq: Two times a day (BID) | ORAL | 0 refills | Status: DC

## 2020-04-26 MED ORDER — ATORVASTATIN CALCIUM 40 MG PO TABS: 40.0000 mg | ORAL_TABLET | Freq: Every day | ORAL | 3 refills | Status: DC

## 2020-04-26 NOTE — Assessment & Plan Note (Addendum)
Patient denies colonoscopy and mammogram, patient counseled on risks and benefits and still declined. -Patient would like to have DEXA scan, DEXA scan ordered

## 2020-04-26 NOTE — Assessment & Plan Note (Addendum)
Patient reports mild dysuria with increased frequency of urination and increased odor. -UA collected at visit -UA significant for moderate bacteria -Prescription for Keflex 500mg  BID x7 days sent

## 2020-04-26 NOTE — Assessment & Plan Note (Signed)
History of resolved TIA.  Patient currently on clopidogrel and aspirin daily.  Currently being followed by neurology.  Spoke with Dr. Leveda Anna about medication recommendations.  Patient is not on a statin currently, could benefit from secondary prevention with high intensity statin medication. -Atorvastatin 40 mg prescribed

## 2020-04-26 NOTE — Progress Notes (Signed)
Subjective:    Patient ID: Leslie Dean, female    DOB: 08-Jan-1946, 74 y.o.   MRN: [id number]   CC: New Patient  HPI: Patient is presenting for establishment of care as she has not had a primary care doctor in many years and has had a history of recent falls with hip fracture and is in need of a DEXA scan and routine care.  Recent TIA: Patient was recently hospitalized from 10/26-10/28 for expressive aphasia with facial droop that resolved and was diagnosed with a TIA.  Patient was placed on clopidogrel 75 mg and aspirin 81 mg daily.  She has had resolution of all symptoms and will be followed by [facility].   HTN: Patient has a history of elevated blood pressures with current medication regimen of losartan 50 mg, furosemide 20 mg, metoprolol succinate.  Blood pressure today is 104/60.  Patient has not been checking her blood pressure at home, she denies any symptoms of hypotension and presyncopal symptoms.  Patient has had a history of falls, with a prior fall resulting in fracture.  Fall with fracture: On 10/18/2019 patient was admitted to the hospital for left proximal femur fracture after an accidental fall.  She was treated with open reduction, internal fixation and discharged with SNF for PT/OT.  Patient reports that she was getting stronger, but now has noticed that she is now weaker and her "legs buckle at times".  She states that her left hip weakness is worse than the right. -Patient is followed by Dr. Charlann Boxer with orthopedics.  She states that she recently saw him and there was concern about her hip fracture and weakness.  Patient reports that he stated she needed a DEXA scan to assess for her bone fragility.  Concern for UTI: Patient reports recent increase in frequency of urination with minimal dysuria, and has also noted an increase in odor.  Patient requesting urinalysis to ensure no she does not have UTI   PMHx: Past Medical History:  Diagnosis Date    . Hepatic cyst 12/03/2012  . Hypertension   . Scoliosis   . Spinal stenosis   Urinary incontinence from the stenosis--not interested in any surgery   Surgical Hx: Past Surgical History:  Procedure Laterality Date  . CESAREAN SECTION    . CHOLECYSTECTOMY N/A 12/04/2012   Procedure: LAPAROSCOPIC Liver Cyst Unroofing and removal of gallbladder;  Surgeon: Almond Lint, MD;  Location: [facility];  Service: General;  Laterality: N/A;  . CYST EXCISION     liver cyst removal  . EYE SURGERY    . FEMUR IM NAIL Left 10/18/2019   Procedure: INTRAMEDULLARY (IM) NAIL FEMORAL;  Surgeon: Durene Romans, MD;  Location: [facility];  Service: Orthopedics;  Laterality: Left;     Family Hx: Family History  Problem Relation Age of Onset  . Marfan syndrome Grandchild      Social Hx: Current Social History 04/26/2020   Who lives at home: patient, daughter, granddaughter   Who would speak for you about health care matters: Any of her children.   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  aspirin 81 MG chewable tablet, Chew 1 tablet (81 mg total) by mouth daily., Disp:  90 tablet, Rfl: 0 .  Calcium Carb-Cholecalciferol (CALCIUM-VITAMIN D) 600-400 MG-UNIT TABS, Take 1 tablet by mouth daily., Disp: , Rfl:  .  Cholecalciferol (VITAMIN D3) 50 MCG (2000 UT) capsule, Take 2,000 Units by mouth daily., Disp: , Rfl:  .  clopidogrel (PLAVIX) 75 MG tablet, Take 1 tablet (75 mg total) by mouth daily., Disp: 90 tablet, Rfl: 0 .  ferrous sulfate 325 (65 FE) MG tablet, Take 325 mg by mouth daily with breakfast., Disp: , Rfl:  .  furosemide (LASIX) 20 MG tablet, Take 20 mg by mouth daily., Disp: , Rfl:  .  losartan (COZAAR) 50  MG tablet, Take 50 mg by mouth daily. , Disp: , Rfl:  .  METOPROLOL SUCCINATE ER PO, Take 1 tablet by mouth daily., Disp: , Rfl:  .  atorvastatin (LIPITOR) 40 MG tablet, Take 1 tablet (40 mg total) by mouth daily., Disp: 90 tablet, Rfl: 3   ROS: Woman:  Patient reports: Vision changes (is currently seeing a retinal specialist), weight loss, leg weakness, increased urinary frequency, mild dysuria Patient denies: Hearing changes, fever, hoarseness, chest pain, recurrent cough, abdominal pain, vaginal bleeding, syncope    Preventative Screening Colonoscopy: Patient refuses Mammogram: Patient refuses Pap test: Patient reports history of prior normal Pap smears DEXA: Has been ordered   Smoking status: Patient was smoking for "my whole life" [place]  Was previously smoking 1 pack/day.  Smoking cessation since May 2021 due to being unable to smoke while hospitalized.   Objective:  BP 104/60   Pulse (!) 103   Ht 5\' 6"  (1.676 m)   Wt 188 lb (85.3 kg)   SpO2 95%   BMI 30.34 kg/m  Vitals and nursing note reviewed  General: well nourished, in no acute distress HEENT: normocephalic, TM's visualized bilaterally, no scleral icterus or conjunctival pallor, no nasal discharge, moist mucous membranes, good dentition without erythema or discharge noted in posterior oropharynx Neck: supple, non-tender, without lymphadenopathy Cardiac: RRR, clear S1 and S2, no murmurs, rubs, or gallops Respiratory: clear to auscultation bilaterally, no increased work of breathing Abdomen: soft, nontender, nondistended, no masses or organomegaly. Bowel sounds present Extremities: no edema or cyanosis. Warm, well perfused. 2+ radial and PT pulses bilaterally, mild hyperpigmentation of bilateral lower extremities, weakness noted in left leg with check testing compared to the right. Skin: warm and dry, mild hyperpigmentation of bilateral lower extremities Neuro: alert and oriented, no focal  deficits   Assessment & Plan:    TIA (transient ischemic attack) History of resolved TIA.  Patient currently on clopidogrel and aspirin daily.  Currently being followed by neurology.  Spoke with Dr. about medication recommendations.  Patient is not on a statin currently, could benefit from secondary prevention with high intensity statin medication. -Atorvastatin 40 mg prescribed  HTN (hypertension) Patient BP in office today 104/60 without having taken medications this morning.  Patient asymptomatic with no reported presyncopal symptoms.  Currently on losartan 50 mg and furosemide 20 mg.  Concern with overmedication for blood pressure medication considering patient has recently lost 20 pounds and medication adjustment may be appropriate. -Discontinue furosemide 20 mg at this time, will restart and adjust losartan instead if patient has an increased in dyspnea or leg swelling. -Patient counseled to check blood pressures at home several times daily after discontinuing furosemide. -Follow-up 2 to 4 weeks  Healthcare maintenance Patient denies colonoscopy and mammogram, patient counseled on risks and benefits and still declined. -Patient would like to have  DEXA scan, DEXA scan ordered  Dysuria Patient reports mild dysuria with increased frequency of urination and increased odor. -UA collected at visit -UA significant for moderate bacteria -Prescription for Keflex 500mg  BID x7 days sent    Return in about 2 weeks (around 05/10/2020).   14/06/2019, DO, PGY-1

## 2020-04-26 NOTE — Patient Instructions (Addendum)
It was a great seeing you today! Today we discussed the following:  DEXA scan: We will have the scan ordered for you so that we can check out what your bone mineral density is and see if we need to do any medications for you.  Urinary changes: We will be getting a urinalysis and we will call you with the results.  Low blood pressure: Today your blood pressure was on the lower side. We recommend that you stop taking the furosemide at the moment. I would like for you to be able to check your blood pressures at home if you can. If you start to notice difficulty with breathing, increased swelling but especially in your legs) or other concerns presented.  Due to a recent history of a TIA, we are prescribing atorvastatin 40 mg. This has been shown to help with morbidity and mortality for patients with this history. If you begin to have muscle aches, most of the time these go away within 2 weeks if it extends any longer than that please let us know. I would like to see you soon.

## 2020-04-26 NOTE — Assessment & Plan Note (Signed)
Patient BP in office today 104/60 without having taken medications this morning.  Patient asymptomatic with no reported presyncopal symptoms.  Currently on losartan 50 mg and furosemide 20 mg.  Concern with overmedication for blood pressure medication considering patient has recently lost 20 pounds and medication adjustment may be appropriate. -Discontinue furosemide 20 mg at this time, will restart and adjust losartan instead if patient has an increased in dyspnea or leg swelling. -Patient counseled to check blood pressures at home several times daily after discontinuing furosemide. -Follow-up 2 to 4 weeks

## 2020-04-27 DIAGNOSIS — M25461 Effusion, right knee: Secondary | ICD-10-CM | POA: Diagnosis not present

## 2020-04-27 DIAGNOSIS — M8000XA Age-related osteoporosis with current pathological fracture, unspecified site, initial encounter for fracture: Secondary | ICD-10-CM | POA: Diagnosis not present

## 2020-04-27 DIAGNOSIS — Z4789 Encounter for other orthopedic aftercare: Secondary | ICD-10-CM | POA: Diagnosis not present

## 2020-04-27 DIAGNOSIS — S7292XD Unspecified fracture of left femur, subsequent encounter for closed fracture with routine healing: Secondary | ICD-10-CM | POA: Diagnosis not present

## 2020-04-27 DIAGNOSIS — J9600 Acute respiratory failure, unspecified whether with hypoxia or hypercapnia: Secondary | ICD-10-CM | POA: Diagnosis not present

## 2020-04-27 DIAGNOSIS — J9601 Acute respiratory failure with hypoxia: Secondary | ICD-10-CM | POA: Diagnosis not present

## 2020-04-27 DIAGNOSIS — Q8740 Marfan's syndrome, unspecified: Secondary | ICD-10-CM | POA: Diagnosis not present

## 2020-04-27 DIAGNOSIS — M6281 Muscle weakness (generalized): Secondary | ICD-10-CM | POA: Diagnosis not present

## 2020-04-27 DIAGNOSIS — S2232XD Fracture of one rib, left side, subsequent encounter for fracture with routine healing: Secondary | ICD-10-CM | POA: Diagnosis not present

## 2020-05-03 LAB — URINE CULTURE

## 2020-05-09 ENCOUNTER — Encounter: Payer: Self-pay | Admitting: Neurology

## 2020-05-09 ENCOUNTER — Ambulatory Visit: Payer: PPO | Admitting: Neurology

## 2020-05-09 VITALS — BP 143/80 | HR 69 | Ht 66.0 in | Wt 192.4 lb

## 2020-05-09 DIAGNOSIS — R4701 Aphasia: Secondary | ICD-10-CM

## 2020-05-09 DIAGNOSIS — G459 Transient cerebral ischemic attack, unspecified: Secondary | ICD-10-CM

## 2020-05-09 NOTE — Progress Notes (Signed)
[facility] [address] [place]. [place] [address] [phone]       OFFICE FOLLOW-UP NOTE  [name] Date of Birth:  1945/08/13 Medical Record Number:  [id number]   HPI: Leslie Dean is a pleasant 74 year old Caucasian lady seen today for initial office follow-up visit in hospital consultation for TIA in October 2021.  History is obtained from the patient, review of electronic medical records and I personally reviewed pertinent imaging films in PACS.  She has past medical history of hypertension, scoliosis and spinal stenosis.  She presented on 04/05/2020 with 1 day history of waking up with a severe headache followed by some difficulty getting words out and speaking.  Symptoms gradually worsened hence she came to the hospital but symptoms resolved by the time she reached out.  CT scan of the head on admission was unremarkable.  MRI scan of the brain showed no acute abnormality only changes of small vessel disease and mild generalized atrophy.  MRI of the brain was motion degraded but did show intracranial sclerotic changes which are moderate to severe involving left M2 branch, right M2 as well as severe proximal right A2 stenosis.  Carotid ultrasound showed no significant extracranial stenosis.  LDL cholesterol 68 mg percent and hemoglobin A1c was 5.8.  Transthoracic echo showed ejection fraction of 55 to 60% without cardiac source of embolism.  Patient was started on dual antiplatelet therapy aspirin Plavix due to intracranial atherosclerosis for 3 months which is tolerating well without significant bruising or bleeding.  She states her blood pressures well controlled at home the today it is borderline in office at 143/80.  Her Lasix has been recently discontinued by her physician.  Patient is use a walker to walk but that is due to pain from her left hip which is chronic.  She has no new complaints today.  ROS:   14 system review of systems is positive for headache,  speech difficulty, word finding difficulties and all other systems negative PMH:  Past Medical History:  Diagnosis Date  . Hepatic cyst 12/03/2012  . Hypertension   . Scoliosis   . Spinal stenosis   . Stroke [facility])    03/24/2020    Social History:  Social History   Socioeconomic History  . Marital status: Divorced    Spouse name: Not on file  . Number of children: Not on file  . Years of education: Not on file  . Highest education level: Not on file  Occupational History  . Not on file  Tobacco Use  . Smoking status: Former Smoker    Packs/day: 1.00    Quit date: 10/09/2019    Years since quitting: 0.5  . Smokeless tobacco: Never Used  Substance and Sexual Activity  . Alcohol use: No  . Drug use: No  . Sexual activity: Not on file  Other Topics Concern  . Not on file  Social History Narrative   Lives with daughter and grandaughter   Right Handed   Drinks 1-2 cups caffeine daily   Social Determinants of Health   Financial Resource Strain:   . Difficulty of Paying Living Expenses: Not on file  Food Insecurity:   . Worried About Programme researcher, broadcasting/film/video in the Last Year: Not on file  . Ran Out of Food in the Last Year: Not on file  Transportation Needs:   . Lack of Transportation (Medical): Not on file  . Lack of Transportation (Non-Medical): Not on file  Physical Activity:   .  Days of Exercise per Week: Not on file  . Minutes of Exercise per Session: Not on file  Stress:   . Feeling of Stress : Not on file  Social Connections:   . Frequency of Communication with Friends and Family: Not on file  . Frequency of Social Gatherings with Friends and Family: Not on file  . Attends Religious Services: Not on file  . Active Member of Clubs or Organizations: Not on file  . Attends Banker Meetings: Not on file  . Marital Status: Not on file  Intimate Partner Violence:   . Fear of Current or Ex-Partner: Not on file  . Emotionally Abused: Not on file  .  Physically Abused: Not on file  . Sexually Abused: Not on file    Medications:   Current Outpatient Medications on File Prior to Visit  Medication Sig Dispense Refill  . aspirin 81 MG chewable tablet Chew 1 tablet (81 mg total) by mouth daily. 90 tablet 0  . atorvastatin (LIPITOR) 40 MG tablet Take 1 tablet (40 mg total) by mouth daily. 90 tablet 3  . Calcium Carb-Cholecalciferol (CALCIUM-VITAMIN D) 600-400 MG-UNIT TABS Take 1 tablet by mouth daily.    . cephALEXin (KEFLEX) 500 MG capsule Take 1 capsule (500 mg total) by mouth 2 (two) times daily. 14 capsule 0  . Cholecalciferol (VITAMIN D3) 50 MCG (2000 UT) capsule Take 2,000 Units by mouth daily.    . clopidogrel (PLAVIX) 75 MG tablet Take 1 tablet (75 mg total) by mouth daily. 90 tablet 0  . ferrous sulfate 325 (65 FE) MG tablet Take 325 mg by mouth daily with breakfast.    . losartan (COZAAR) 50 MG tablet Take 50 mg by mouth daily.     Marland Kitchen METOPROLOL SUCCINATE ER PO Take 1 tablet by mouth daily.     No current facility-administered medications on file prior to visit.    Allergies:   Allergies  Allergen Reactions  . Other     Says narcotics cause nausea for her    Physical Exam General: Frail elderly Caucasian lady, seated, in no evident distress Head: head normocephalic and atraumatic.  Neck: supple with no carotid or supraclavicular bruits Cardiovascular: regular rate and rhythm, no murmurs Musculoskeletal: Severe kyphoscoliosis Skin:  no rash/petichiae Vascular:  Normal pulses all extremities Vitals:   05/09/20 1024  BP: (!) 143/80  Pulse: 69   Neurologic Exam Mental Status: Awake and fully alert. Oriented to place and time. Recent and remote memory intact. Attention span, concentration and fund of knowledge appropriate. Mood and affect appropriate.  Cranial Nerves: Fundoscopic exam reveals sharp disc margins. Pupils equal, briskly reactive to light. Extraocular movements full without nystagmus.  Mild mechanical ptosis  of the left eye.  Visual fields full to confrontation. Hearing intact. Facial sensation intact. Face, tongue, palate moves normally and symmetrically.  Motor: Normal bulk and tone. Normal strength in all tested extremity muscles. Sensory.: intact to touch ,pinprick .position and vibratory sensation.  Coordination: Rapid alternating movements normal in all extremities. Finger-to-nose and heel-to-shin performed accurately bilaterally. Gait and Station: Arises from chair with some difficulty. Stance is stooped. Gait is antalgic with favoring of the left hip due to pain.  Unable to walk tandem.   Reflexes: 1+ and symmetric. Toes downgoing.   NIHSS  Rankin  1   ASSESSMENT: 74 year old Caucasian lady with left hemispheric TIA in October 2021 likely from intracranial atherosclerosis.  Vascular risk factors of hypertension, hyperlipidemia, intracranial atherosclerosis and age  PLAN: I had a long d/w patient about her recent TIA, risk for recurrent stroke/TIAs, personally independently reviewed imaging studies and stroke evaluation results and answered questions.Continue aspirin 81 mg and Plavix 75 mg daily for 3 months to be followed by aspirin alone for secondary stroke prevention and maintain strict control of hypertension with blood pressure goal below 130/90, diabetes with hemoglobin A1c goal below 6.5% and lipids with LDL cholesterol goal below 70 mg/dL. I also advised the patient to eat a healthy diet with plenty of whole grains, cereals, fruits and vegetables, exercise regularly and maintain ideal body weight .she was advised to use a walker at all times for ambulation and safety.  Followup in the future with my nurse practitioner Shanda Bumps in 6 months or call earlier if necessary. Greater than 50% of time during this 25 minute visit was spent on counseling,explanation of diagnosis, planning of further management, discussion with patient and family and coordination of care Delia Heady,  MD Note: This document was prepared with digital dictation and possible smart phrase technology. Any transcriptional errors that result from this process are unintentional

## 2020-05-09 NOTE — Patient Instructions (Signed)
I had a long d/w patient about her recent TIA, risk for recurrent stroke/TIAs, personally independently reviewed imaging studies and stroke evaluation results and answered questions.Continue aspirin 81 mg and Plavix 75 mg daily for 3 months to be followed by aspirin alone for secondary stroke prevention and maintain strict control of hypertension with blood pressure goal below 130/90, diabetes with hemoglobin A1c goal below 6.5% and lipids with LDL cholesterol goal below 70 mg/dL. I also advised the patient to eat a healthy diet with plenty of whole grains, cereals, fruits and vegetables, exercise regularly and maintain ideal body weight .she was advised to use a walker at all times for ambulation and safety.  Followup in the future with my nurse practitioner Shanda Bumps in 6 months or call earlier if necessary.  Stroke Prevention Some medical conditions and behaviors are associated with a higher chance of having a stroke. You can help prevent a stroke by making nutrition, lifestyle, and other changes, including managing any medical conditions you may have. What nutrition changes can be made?   Eat healthy foods. You can do this by: ? Choosing foods high in fiber, such as fresh fruits and vegetables and whole grains. ? Eating at least 5 or more servings of fruits and vegetables a day. Try to fill half of your plate at each meal with fruits and vegetables. ? Choosing lean protein foods, such as lean cuts of meat, poultry without skin, fish, tofu, beans, and nuts. ? Eating low-fat dairy products. ? Avoiding foods that are high in salt (sodium). This can help lower blood pressure. ? Avoiding foods that have saturated fat, trans fat, and cholesterol. This can help prevent high cholesterol. ? Avoiding processed and premade foods.  Follow your health care provider's specific guidelines for losing weight, controlling high blood pressure (hypertension), lowering high cholesterol, and managing diabetes. These may  include: ? Reducing your daily calorie intake. ? Limiting your daily sodium intake to 1,500 milligrams (mg). ? Using only healthy fats for cooking, such as olive oil, canola oil, or sunflower oil. ? Counting your daily carbohydrate intake. What lifestyle changes can be made?  Maintain a healthy weight. Talk to your health care provider about your ideal weight.  Get at least 30 minutes of moderate physical activity at least 5 days a week. Moderate activity includes brisk walking, biking, and swimming.  Do not use any products that contain nicotine or tobacco, such as cigarettes and e-cigarettes. If you need help quitting, ask your health care provider. It may also be helpful to avoid exposure to secondhand smoke.  Limit alcohol intake to no more than 1 drink a day for nonpregnant women and 2 drinks a day for men. One drink equals 12 oz of beer, 5 oz of wine, or 1 oz of hard liquor.  Stop any illegal drug use.  Avoid taking birth control pills. Talk to your health care provider about the risks of taking birth control pills if: ? You are over 23 years old. ? You smoke. ? You get migraines. ? You have ever had a blood clot. What other changes can be made?  Manage your cholesterol levels. ? Eating a healthy diet is important for preventing high cholesterol. If cholesterol cannot be managed through diet alone, you may also need to take medicines. ? Take any prescribed medicines to control your cholesterol as told by your health care provider.  Manage your diabetes. ? Eating a healthy diet and exercising regularly are important parts of managing your blood sugar.  If your blood sugar cannot be managed through diet and exercise, you may need to take medicines. ? Take any prescribed medicines to control your diabetes as told by your health care provider.  Control your hypertension. ? To reduce your risk of stroke, try to keep your blood pressure below 130/80. ? Eating a healthy diet and  exercising regularly are an important part of controlling your blood pressure. If your blood pressure cannot be managed through diet and exercise, you may need to take medicines. ? Take any prescribed medicines to control hypertension as told by your health care provider. ? Ask your health care provider if you should monitor your blood pressure at home. ? Have your blood pressure checked every year, even if your blood pressure is normal. Blood pressure increases with age and some medical conditions.  Get evaluated for sleep disorders (sleep apnea). Talk to your health care provider about getting a sleep evaluation if you snore a lot or have excessive sleepiness.  Take over-the-counter and prescription medicines only as told by your health care provider. Aspirin or blood thinners (antiplatelets or anticoagulants) may be recommended to reduce your risk of forming blood clots that can lead to stroke.  Make sure that any other medical conditions you have, such as atrial fibrillation or atherosclerosis, are managed. What are the warning signs of a stroke? The warning signs of a stroke can be easily remembered as BEFAST.  B is for balance. Signs include: ? Dizziness. ? Loss of balance or coordination. ? Sudden trouble walking.  E is for eyes. Signs include: ? A sudden change in vision. ? Trouble seeing.  F is for face. Signs include: ? Sudden weakness or numbness of the face. ? The face or eyelid drooping to one side.  A is for arms. Signs include: ? Sudden weakness or numbness of the arm, usually on one side of the body.  S is for speech. Signs include: ? Trouble speaking (aphasia). ? Trouble understanding.  T is for time. ? These symptoms may represent a serious problem that is an emergency. Do not wait to see if the symptoms will go away. Get medical help right away. Call your local emergency services (911 in the [place] Do not drive yourself to the hospital.  Other signs of stroke  may include: ? A sudden, severe headache with no known cause. ? Nausea or vomiting. ? Seizure. Where to find more information For more information, visit:  American Stroke Association: www.strokeassociation.org  National Stroke Association: www.stroke.org Summary  You can prevent a stroke by eating healthy, exercising, not smoking, limiting alcohol intake, and managing any medical conditions you may have.  Do not use any products that contain nicotine or tobacco, such as cigarettes and e-cigarettes. If you need help quitting, ask your health care provider. It may also be helpful to avoid exposure to secondhand smoke.  Remember BEFAST for warning signs of stroke. Get help right away if you or a loved one has any of these signs. This information is not intended to replace advice given to you by your health care provider. Make sure you discuss any questions you have with your health care provider. Document Revised: 05/09/2017 Document Reviewed: 07/02/2016 Elsevier Patient Education  2020 [employer].

## 2020-05-15 ENCOUNTER — Inpatient Hospital Stay: Payer: PPO | Admitting: Adult Health

## 2020-05-18 DIAGNOSIS — M25571 Pain in right ankle and joints of right foot: Secondary | ICD-10-CM | POA: Diagnosis not present

## 2020-05-18 DIAGNOSIS — S72142D Displaced intertrochanteric fracture of left femur, subsequent encounter for closed fracture with routine healing: Secondary | ICD-10-CM | POA: Diagnosis not present

## 2020-05-22 DIAGNOSIS — S72002D Fracture of unspecified part of neck of left femur, subsequent encounter for closed fracture with routine healing: Secondary | ICD-10-CM | POA: Diagnosis not present

## 2020-05-22 DIAGNOSIS — Q8740 Marfan's syndrome, unspecified: Secondary | ICD-10-CM | POA: Diagnosis not present

## 2020-05-22 DIAGNOSIS — Z9181 History of falling: Secondary | ICD-10-CM | POA: Diagnosis not present

## 2020-05-22 DIAGNOSIS — R2681 Unsteadiness on feet: Secondary | ICD-10-CM | POA: Diagnosis not present

## 2020-05-24 DIAGNOSIS — M25571 Pain in right ankle and joints of right foot: Secondary | ICD-10-CM | POA: Insufficient documentation

## 2020-05-25 ENCOUNTER — Other Ambulatory Visit: Payer: Self-pay

## 2020-05-25 ENCOUNTER — Ambulatory Visit (INDEPENDENT_AMBULATORY_CARE_PROVIDER_SITE_OTHER): Payer: PPO | Admitting: Family Medicine

## 2020-05-25 ENCOUNTER — Ambulatory Visit
Admission: RE | Admit: 2020-05-25 | Discharge: 2020-05-25 | Disposition: A | Discharge status: Home / Self Care | Payer: PPO | Source: Ambulatory Visit | Attending: Family Medicine | Admitting: Family Medicine

## 2020-05-25 VITALS — BP 119/75 | HR 74 | Ht 66.0 in | Wt 193.4 lb

## 2020-05-25 DIAGNOSIS — M7989 Other specified soft tissue disorders: Secondary | ICD-10-CM | POA: Diagnosis not present

## 2020-05-25 DIAGNOSIS — R600 Localized edema: Secondary | ICD-10-CM

## 2020-05-25 DIAGNOSIS — S9002XA Contusion of left ankle, initial encounter: Secondary | ICD-10-CM | POA: Diagnosis not present

## 2020-05-25 DIAGNOSIS — S9032XA Contusion of left foot, initial encounter: Secondary | ICD-10-CM | POA: Diagnosis not present

## 2020-05-25 LAB — POCT UA - MICROSCOPIC ONLY: Epithelial cells, urine per micros: >20

## 2020-05-25 LAB — POCT URINALYSIS DIP (MANUAL ENTRY)
Bilirubin, UA: NEGATIVE
Glucose, UA: NEGATIVE mg/dL
Ketones, POC UA: NEGATIVE mg/dL
Leukocytes, UA: NEGATIVE
Nitrite, UA: NEGATIVE
Protein Ur, POC: NEGATIVE mg/dL
Spec Grav, UA: >=1.03 — AB (ref 1.010–1.025)
Urobilinogen, UA: 0.2 E.U./dL
pH, UA: 5.5 (ref 5.0–8.0)

## 2020-05-25 NOTE — Progress Notes (Addendum)
Subjective:   Patient ID: Leslie Dean    DOB: 1946-02-14, 74 y.o. female   MRN: [id number]  Leslie Dean is a 74 y.o. female with a history of HTN, TIA, h/o acute respiratory failure, closed left hip fracture, h/o pressure injury to skin, marfans syndrome  here for left foot pain  Left foot pain: Patient presents with left foot pain and swelling x 3 days. She notes that she woke up overnight 3 days ago with her foot in pain and swollen. She has never had this before. Denies any recent travel, recent surgeries. She was on lasix for Blood pressure and LE swelling but this was discontinued at end of November. She notes that she does get swelling in her legs but usually just localized to around the ankle. She denies any recent trauma. She dropped a bedside dresser drawer on her foot about 2 months ago. She notes she had a TIA about 1 month ago but has not had residual symptoms. She denies improvement in the swelling with elevation.   Review of Systems:  Per HPI.   Objective:   BP 119/75   Pulse 74   Ht 5\' 6"  (1.676 m)   Wt 193 lb 6.4 oz (87.7 kg)   SpO2 96%   BMI 31.22 kg/m  Vitals and nursing note reviewed.  General: pleasant elderly female, sitting comfortably in wheel chair, well nourished, well developed, in no acute distress with non-toxic appearance Resp: breathing comfortably on room air, speaking in full sentences Skin: warm, dry, superficial veins present on right foot, ecchymosis present along dorsal distal foot along base of 2-5th toes, area of erythema along dorsal distal lateral foot, no open wounds appreciated, thickened yellow nails, no significant increased warmth Extremities: warm and well perfused, 2+ pedal pulses bilaterally, 3+ pitting edema bilaterally (localized to ankle on right, ankle and entire foot on left), tender to palpation along dorsal aspect of foot, ankle nontender to palpation, normal ankle ROM without pain, normal ROM of left phalanges without pain,  normal sensation, calf size symmetric with no edema, erythema, warmth or swelling bilaterally MSK: ROM grossly intact, strength intact, gait normal Neuro: Alert and oriented, speech normal    Assessment & Plan:   Unilateral edema of lower extremity Acute unilateral left LE swelling localized to ankle and foot x 3 days. Unclear etiology at this time. Foot/ankle x-ray negative for fracture however bruising evident on physical exam indicates possible trauma patient may not be aware of. CBC with diff negative for anemia or infectious cause. CMP normal. UA without proteinuria. Most recent echo in 03/2020 with normal EF of 55-60%, G1DD, mildly dilated left atrium, mild aortic valve regurg, aortic dilatation.  Well Scores 0. She does have evidence of varicose veins placing her at risk for venous insufficiency. She also recently discontinued Lasix which I suspect is contributing. No other contributing medications. Hemodynamically stable. Neurovascularly intact.  No history of gout and no pain to palpation of joints makes this less likely.   - at this time recommend rest, ice, compression, elevation - Follow up scheduled for 05/31/20 to reevaluate, sooner if it is worsening or develops any respiratory symptoms. - Patient voiced understanding and agreement with plan. - If no improvement or any extension up leg, plan to get LE doppler.  - will add on acid level to evaluate for gout  Orders Placed This Encounter  Procedures  . DG Foot Complete Left    Standing Status:   Future    Number  of Occurrences:   1    Standing Expiration Date:   05/25/2021    Order Specific Question:   Reason for Exam (SYMPTOM  OR DIAGNOSIS REQUIRED)    Answer:   unilateral LE swelling x 3d,  trauma 2 months ago    Order Specific Question:   Preferred imaging location?    Answer:   [facility]  . DG Ankle Complete Left    Standing Status:   Future    Number of Occurrences:   1    Standing Expiration Date:   05/25/2021     Order Specific Question:   Reason for Exam (SYMPTOM  OR DIAGNOSIS REQUIRED)    Answer:   unilateral LE swelling x 3 days, trauma 2 months ago    Order Specific Question:   Preferred imaging location?    Answer:   [facility]  . CBC with Differential  . Comprehensive metabolic panel  . POCT urinalysis dipstick  . POCT UA - Microscopic Only    Orpah Cobb, DO PGY-3, [facility] 05/26/2020 2:19 PM

## 2020-05-25 NOTE — Patient Instructions (Signed)
It was a pleasure to see you today!  Thank you for choosing [facility] for your primary care.   Our plans for today were:  Elevate your leg  Avoid aggravating activities  Please go to [facility] to get x-rays as soon as you can  I have gotten blood work today. I will call you if anything is abnormal    You should return to our clinic in 1-2 weeks with PCP for follow up.   Best Wishes,   Orpah Cobb, DO

## 2020-05-26 DIAGNOSIS — S90122A Contusion of left lesser toe(s) without damage to nail, initial encounter: Secondary | ICD-10-CM | POA: Insufficient documentation

## 2020-05-26 DIAGNOSIS — S9032XA Contusion of left foot, initial encounter: Secondary | ICD-10-CM | POA: Insufficient documentation

## 2020-05-26 LAB — COMPREHENSIVE METABOLIC PANEL
ALT: 8 IU/L (ref 0–32)
AST: 10 IU/L (ref 0–40)
Albumin/Globulin Ratio: 1.7 (ref 1.2–2.2)
Albumin: 4 g/dL (ref 3.7–4.7)
Alkaline Phosphatase: 95 IU/L (ref 44–121)
BUN/Creatinine Ratio: 18 (ref 12–28)
BUN: 17 mg/dL (ref 8–27)
Bilirubin Total: 0.9 mg/dL (ref 0.0–1.2)
CO2: 24 mmol/L (ref 20–29)
Calcium: 10.1 mg/dL (ref 8.7–10.3)
Chloride: 105 mmol/L (ref 96–106)
Creatinine, Ser: 0.93 mg/dL (ref 0.57–1.00)
GFR calc Af Amer: 70 mL/min/{1.73_m2} (ref >59)
GFR calc non Af Amer: 61 mL/min/{1.73_m2} (ref >59)
Globulin, Total: 2.4 g/dL (ref 1.5–4.5)
Glucose: 92 mg/dL (ref 65–99)
Potassium: 4.6 mmol/L (ref 3.5–5.2)
Sodium: 145 mmol/L — ABNORMAL HIGH (ref 134–144)
Total Protein: 6.4 g/dL (ref 6.0–8.5)

## 2020-05-26 LAB — CBC WITH DIFFERENTIAL/PLATELET
Basophils Absolute: 0 10*3/uL (ref 0.0–0.2)
Basos: 0 %
EOS (ABSOLUTE): 0.1 10*3/uL (ref 0.0–0.4)
Eos: 1 %
Hematocrit: 40.1 % (ref 34.0–46.6)
Hemoglobin: 12.9 g/dL (ref 11.1–15.9)
Immature Grans (Abs): 0 10*3/uL (ref 0.0–0.1)
Immature Granulocytes: 0 %
Lymphocytes Absolute: 3 10*3/uL (ref 0.7–3.1)
Lymphs: 30 %
MCH: 28.4 pg (ref 26.6–33.0)
MCHC: 32.2 g/dL (ref 31.5–35.7)
MCV: 88 fL (ref 79–97)
Monocytes Absolute: 0.7 10*3/uL (ref 0.1–0.9)
Monocytes: 7 %
Neutrophils Absolute: 6.2 10*3/uL (ref 1.4–7.0)
Neutrophils: 62 %
Platelets: 235 10*3/uL (ref 150–450)
RBC: 4.54 x10E6/uL (ref 3.77–5.28)
RDW: 14.4 % (ref 11.7–15.4)
WBC: 10.1 10*3/uL (ref 3.4–10.8)

## 2020-05-26 NOTE — Addendum Note (Signed)
Addended by: Joana Reamer on: 05/26/2020 02:19 PM   Modules accepted: Orders

## 2020-05-26 NOTE — Assessment & Plan Note (Addendum)
Acute unilateral left LE swelling localized to ankle and foot x 3 days. Unclear etiology at this time. Foot/ankle x-ray negative for fracture however bruising evident on physical exam indicates possible trauma patient may not be aware of. CBC with diff negative for anemia or infectious cause. CMP normal. UA without proteinuria. Most recent echo in 03/2020 with normal EF of 55-60%, G1DD, mildly dilated left atrium, mild aortic valve regurg, aortic dilatation.  Well Scores 0. She does have evidence of varicose veins placing her at risk for venous insufficiency. She also recently discontinued Lasix which I suspect is contributing. No other contributing medications. Hemodynamically stable. Neurovascularly intact.  No history of gout and no pain to palpation of joints makes this less likely.   - at this time recommend rest, ice, compression, elevation - Follow up scheduled for 05/31/20 to reevaluate, sooner if it is worsening or develops any respiratory symptoms. - Patient voiced understanding and agreement with plan. - If no improvement or any extension up leg, plan to get LE doppler.  - will add on acid level to evaluate for gout

## 2020-05-27 DIAGNOSIS — M8000XA Age-related osteoporosis with current pathological fracture, unspecified site, initial encounter for fracture: Secondary | ICD-10-CM | POA: Diagnosis not present

## 2020-05-27 DIAGNOSIS — J9601 Acute respiratory failure with hypoxia: Secondary | ICD-10-CM | POA: Diagnosis not present

## 2020-05-27 DIAGNOSIS — Q8740 Marfan's syndrome, unspecified: Secondary | ICD-10-CM | POA: Diagnosis not present

## 2020-05-27 DIAGNOSIS — J9600 Acute respiratory failure, unspecified whether with hypoxia or hypercapnia: Secondary | ICD-10-CM | POA: Diagnosis not present

## 2020-05-27 DIAGNOSIS — M6281 Muscle weakness (generalized): Secondary | ICD-10-CM | POA: Diagnosis not present

## 2020-05-27 DIAGNOSIS — S2232XD Fracture of one rib, left side, subsequent encounter for fracture with routine healing: Secondary | ICD-10-CM | POA: Diagnosis not present

## 2020-05-27 DIAGNOSIS — S7292XD Unspecified fracture of left femur, subsequent encounter for closed fracture with routine healing: Secondary | ICD-10-CM | POA: Diagnosis not present

## 2020-05-27 DIAGNOSIS — M25461 Effusion, right knee: Secondary | ICD-10-CM | POA: Diagnosis not present

## 2020-05-27 DIAGNOSIS — Z4789 Encounter for other orthopedic aftercare: Secondary | ICD-10-CM | POA: Diagnosis not present

## 2020-05-27 LAB — URIC ACID: Uric Acid: 6.2 mg/dL (ref 3.1–7.9)

## 2020-05-27 LAB — SPECIMEN STATUS REPORT

## 2020-05-29 ENCOUNTER — Telehealth: Payer: Self-pay

## 2020-05-29 ENCOUNTER — Other Ambulatory Visit: Payer: Self-pay

## 2020-05-29 ENCOUNTER — Ambulatory Visit (INDEPENDENT_AMBULATORY_CARE_PROVIDER_SITE_OTHER): Payer: PPO | Admitting: Family Medicine

## 2020-05-29 DIAGNOSIS — R600 Localized edema: Secondary | ICD-10-CM | POA: Diagnosis not present

## 2020-05-29 DIAGNOSIS — S90122D Contusion of left lesser toe(s) without damage to nail, subsequent encounter: Secondary | ICD-10-CM

## 2020-05-29 DIAGNOSIS — S9032XD Contusion of left foot, subsequent encounter: Secondary | ICD-10-CM

## 2020-05-29 NOTE — Progress Notes (Signed)
    SUBJECTIVE:   CHIEF COMPLAINT / HPI: f/u swelling of left foot  Left foot: patient was seen on 05/25/20 by Dr. Mauri Reading for pain and swelling of left foot. Patient today still has swollen dorsal aspect over toes and mid-foot with ecchymosis of 3rd and 4th digits. Her foot is painful to touch, but no erythema, pustulant or serous drainage. She has been elevating during parts of the day and using a walker to give some rest to foot, but she does not have any compression devices. Lab work from 12/16 overall pristine, likely not gout due to uric acid level of 6.2, no leukocytosis or electrolyte derangement. Patient is on lasix, but his has been held since 12/16 visit. Patient is also taking ASA and clopidogrel.  PERTINENT  PMH / PSH: HTN, marfan's syndrome  OBJECTIVE:   BP 130/80   Pulse (!) 56   Ht 5\' 6"  (1.676 m)   Wt 196 lb 4 oz (89 kg)   SpO2 95%   BMI 31.68 kg/m   Nursing note and vitals reviewed GEN: Age-appropriate WW, resting comfortably in chair, NAD, WNWD HEENT: NCAT. Sclera without injection or icterus. MMM.  Neuro: Alert and at baseline Ext: Patient has 2+ pretibial edema bilaterally, no erythema or warmth to either lower extremity.  Left foot has ecchymosis and mild swelling over dorsal aspect of toes and midfoot, no serous or pustulant drainage, no erythema. Psych: Pleasant and appropriate    ASSESSMENT/PLAN:   Contusion of left foot including toes Patient has ecchymosis over dorsal aspect of foot, consistent with likely traumatic etiology for swelling of soft tissue.  No signs of infection as skin is intact, no serous or pustulant drainage.  No spreading erythema.  The pretibial edema that is present bilaterally is not related to the swelling over the anterior aspect of the foot.  Please see images, likely patient does not remember trauma as she is on both aspirin and Plavix, making her more likely to bruise.  Recommend continued rest, ice, compression, elevation.  Wrapped  patient's left foot in an Ace bandage, recommend ice and Tylenol.  Prognosis expect foot to improve in the next week.  Gave patient strict return precautions, please see AVS.  Follow-up in 1 week to ensure improvement.  Bilateral lower extremity edema Patient has 2+ lower extremity pitting edema that is equal and symmetric on both sides.  She was recently seen and Lasix was held 4 days ago, suspect that peripheral edema is worsening due to this reason.  She states that she is on Lasix for edema.  Recommend that she restart this medication and will have her follow-up in 1 week to ensure improvement.  Return precautions given see AVS.  Can consider only doing compression stockings instead of Lasix as recent echo shows no heart failure.  Do not suspect DVT as swelling is not unilateral, no heat, no erythema.     , MD [facility]

## 2020-05-29 NOTE — Assessment & Plan Note (Signed)
Patient has 2+ lower extremity pitting edema that is equal and symmetric on both sides.  She was recently seen and Lasix was held 4 days ago, suspect that peripheral edema is worsening due to this reason.  She states that she is on Lasix for edema.  Recommend that she restart this medication and will have her follow-up in 1 week to ensure improvement.  Return precautions given see AVS.  Can consider only doing compression stockings instead of Lasix as recent echo shows no heart failure.  Do not suspect DVT as swelling is not unilateral, no heat, no erythema.

## 2020-05-29 NOTE — Assessment & Plan Note (Signed)
Patient has ecchymosis over dorsal aspect of foot, consistent with likely traumatic etiology for swelling of soft tissue.  No signs of infection as skin is intact, no serous or pustulant drainage.  No spreading erythema.  The pretibial edema that is present bilaterally is not related to the swelling over the anterior aspect of the foot.  Please see images, likely patient does not remember trauma as she is on both aspirin and Plavix, making her more likely to bruise.  Recommend continued rest, ice, compression, elevation.  Wrapped patient's left foot in an Ace bandage, recommend ice and Tylenol.  Prognosis expect foot to improve in the next week.  Gave patient strict return precautions, please see AVS.  Follow-up in 1 week to ensure improvement.

## 2020-05-29 NOTE — Telephone Encounter (Signed)
Patient calls nurse line reporting continued swelling and busing in foot. Patient reports she saw her  Normal results on mychart and normal imaging. Patient has an apt with [facility] on 12/22, however she would like to move forward with LE doppler. Patient is wondering if 12/22 apt is necessary at this time since nothing has changed. Please advise.

## 2020-05-29 NOTE — Telephone Encounter (Signed)
I am sorry she is so uncomfortable. I agree with same day evaluation if it is worsening. Would recommend LE doppler. Eval for signs of infection as this was not convincing on prior exam. Will defer remaining plan per Dr. Thomes Lolling evaluation. May benefit from retrial of lasix if all is negative given recent discontinuation.

## 2020-05-29 NOTE — Telephone Encounter (Signed)
Patient returns call to nurse line requesting same day appointment due to swelling and discomfort in foot. Scheduled in ATC this afternoon.   Routing to PCP and Dr. Leary Roca.   Veronda Prude, RN

## 2020-05-29 NOTE — Patient Instructions (Addendum)
It was a pleasure to see you today!  1. You have a contusion (bruise) to your left foot, that is causing pain and swelling. I recommend that you keep this in a compression wrap during the day, elevate when not walking, and use ice and tylenol for pain and inflammation. I expect this to improve in the next week. If you have any worsening swelling, or redness spreading up the leg on one side only, numbness or decreased sensation please call our office [phone] to follow up.   2. For your swelling on both legs: restart your lasix dose. Let us know if this does not improve the swelling in your shins.  3. Follow up next Monday, June 05, 2020 at 2:30 PM  Be Well,  Dr. Leary Roca

## 2020-05-31 ENCOUNTER — Ambulatory Visit: Payer: PPO | Admitting: Family Medicine

## 2020-06-05 ENCOUNTER — Ambulatory Visit: Payer: PPO

## 2020-06-08 ENCOUNTER — Encounter: Payer: Self-pay | Admitting: Family Medicine

## 2020-06-08 ENCOUNTER — Other Ambulatory Visit: Payer: Self-pay

## 2020-06-08 ENCOUNTER — Ambulatory Visit (INDEPENDENT_AMBULATORY_CARE_PROVIDER_SITE_OTHER): Payer: PPO | Admitting: Family Medicine

## 2020-06-08 VITALS — BP 122/72 | HR 75 | Wt 198.4 lb

## 2020-06-08 DIAGNOSIS — M2012 Hallux valgus (acquired), left foot: Secondary | ICD-10-CM | POA: Diagnosis not present

## 2020-06-08 DIAGNOSIS — B351 Tinea unguium: Secondary | ICD-10-CM

## 2020-06-08 DIAGNOSIS — S9032XD Contusion of left foot, subsequent encounter: Secondary | ICD-10-CM | POA: Diagnosis not present

## 2020-06-08 DIAGNOSIS — M2011 Hallux valgus (acquired), right foot: Secondary | ICD-10-CM | POA: Diagnosis not present

## 2020-06-08 DIAGNOSIS — S90122D Contusion of left lesser toe(s) without damage to nail, subsequent encounter: Secondary | ICD-10-CM

## 2020-06-08 NOTE — Assessment & Plan Note (Addendum)
Resolving. Minimal bruising over the dorsal ascpect of the foot remains.  Question whether pt was bitten by something as there a small area of clearing with surrounding warmth in her midfoot in line with her fourth toe. Continue Lasix. Recommended compression hose. Restart PT as tolerated.

## 2020-06-08 NOTE — Assessment & Plan Note (Signed)
Pt requests referral to podiatry. She also has bilateral hallus valgus deformities. Referral placed.

## 2020-06-08 NOTE — Progress Notes (Signed)
   SUBJECTIVE:   CHIEF COMPLAINT / HPI:   Chief Complaint  Patient presents with  . Follow-up     Leslie Dean is a 74 y.o. female here for follow-up of left foot pain.  Patient reports great improvement in her pain in her foot and the redness has improved.  She has restarted her Lasix and the swelling has also improved.  She would like clearance to restart physical therapy.    PERTINENT  PMH / PSH: reviewed and updated as appropriate   OBJECTIVE:   BP 122/72   Pulse 75   Wt 198 lb 6.4 oz (90 kg)   SpO2 99%   BMI 32.02 kg/m    GEN: well appearing elderly female in no acute distress  CVS: well perfused  RESP: speaking in full sentences without pause, no respiratory distress  MSK: uses walker, bilateral bunions SKIN: See imag below, central area bilateral onychomycosis most evident on great toe, hallus valgus deformity, 2-3 mm area of clearing with surrounding warmth in her midfoot in-line with fourth toe      ASSESSMENT/PLAN:   Contusion of left foot including toes Resolving. Minimal bruising over the dorsal ascpect of the foot remains.  Question whether pt was bitten by something as there a small area of clearing with surrounding warmth in her midfoot in line with her fourth toe. Continue Lasix. Recommended compression hose. Restart PT as tolerated.   Onychomycosis Pt requests referral to podiatry. She also has bilateral hallus valgus deformities. Referral placed.      Katha Cabal, DO PGY-2, [facility] 06/08/2020

## 2020-06-08 NOTE — Patient Instructions (Signed)
[name], so glad to see your foot healing.  Continue taking the Lasix.  Recommend compression socks as well so we can call you off of Lasix in the future.  Return to physical therapy as you can tolerate.   Referral was placed for podiatry.  You should receive a phone call from our office or the podiatrist office regarding your appointment.  Feel free to contact our office at [phone] with any additional questions or concerns.  Take care,   Dr. Rachael Darby

## 2020-06-19 ENCOUNTER — Ambulatory Visit (INDEPENDENT_AMBULATORY_CARE_PROVIDER_SITE_OTHER): Payer: PPO | Admitting: Family Medicine

## 2020-06-19 ENCOUNTER — Other Ambulatory Visit: Payer: Self-pay

## 2020-06-19 ENCOUNTER — Encounter: Payer: Self-pay | Admitting: Family Medicine

## 2020-06-19 VITALS — BP 120/90 | Ht 66.0 in | Wt 198.5 lb

## 2020-06-19 DIAGNOSIS — R350 Frequency of micturition: Secondary | ICD-10-CM | POA: Insufficient documentation

## 2020-06-19 DIAGNOSIS — R600 Localized edema: Secondary | ICD-10-CM

## 2020-06-19 LAB — POCT URINALYSIS DIP (MANUAL ENTRY)
Bilirubin, UA: NEGATIVE
Glucose, UA: NEGATIVE mg/dL
Ketones, POC UA: NEGATIVE mg/dL
Leukocytes, UA: NEGATIVE
Nitrite, UA: NEGATIVE
Protein Ur, POC: NEGATIVE mg/dL
Spec Grav, UA: 1.02 (ref 1.010–1.025)
Urobilinogen, UA: 0.2 E.U./dL
pH, UA: 5 (ref 5.0–8.0)

## 2020-06-19 NOTE — Assessment & Plan Note (Addendum)
Patient reports increased urinary frequency and volume, as well as moderate leakage with increased abdominal pressure.  Patient previously treated for UTI in 04/2020 with Keflex. -U/A today did not show signs of UTI -Continue to monitor symptoms for 1-2 weeks, if no improvement can consider another antibiotic course.

## 2020-06-19 NOTE — Patient Instructions (Signed)
It is so great to see you today!  Today we discussed the following:  Urinary frequency: We are going ahead and getting a urinalysis to determine if you still have a UTI.  If there are signs that you may still have an infection, we will call you in a prescription for treatment.  Left foot pain/swelling: Your foot looks much improved from prior exams.  For now, continue the Lasix.  I do highly encourage that he get the TED hose, as I think that this will help with some of the lingering swelling as well as may help [place] decrease the amount of Lasix that you are taking as it can be hard on your kidneys.  If there are problems with putting the TED hose on and off, we can look for another solution that may help.  I do also recommend that you elevate your legs when possible.

## 2020-06-19 NOTE — Assessment & Plan Note (Signed)
Bilateral lower extremities still show trace pitting edema.  Chronic hyperpigmentation changes also present. -Patient currently on Lasix 20 mg daily -Encourage use of TED hose -Encourage elevation of lower extremities

## 2020-06-19 NOTE — Progress Notes (Addendum)
    SUBJECTIVE:   CHIEF COMPLAINT / HPI:   Left foot pain follow-up: Patient reports that the pain she had in her left leg is much improved and that there is only a small area at the base of the fifth toe that sometimes hurts when pressed.  She also states that her swelling is much improved, that she states there is still little bit of swelling.  She is concerned that there is still swelling even though she is currently on Lasix, which is a problem that she has not had before while being on Lasix.  She states she has not yet gotten the TED hose and is"not good about elevating the legs".  Urinary frequency and volume Has been back on lasix since august. Feels that her urinary output has been increased more than prior in the last 3-4 weeks. Reports some odor as well as moderate leakage when getting up from sitting. Denies dysuria, hesitancy. States that she has decreased her activity level due to her foot and that the deconditioning may have lead to more leakage. She felt that the UTI never fully resolved from November 2021, which was treated with Keflex.  PERTINENT  PMH / PSH: Reviewed and updated as appropriate  OBJECTIVE:   BP 120/90   Ht 5\' 6"  (1.676 m)   Wt 198 lb 8 oz (90 kg)   BMI 32.04 kg/m   Gen: well-appearing, NAD CV: RRR, no m/r/g appreciated, trace pitting edema in left foot Pulm: CTAB, no wheezes/crackles GI: soft, non-tender, non-distended MSK: uses walker to ambulate Skin: Chronic hyperpigmentation likely from venous stasis, per picture below     ASSESSMENT/PLAN:   Bilateral lower extremity edema Bilateral lower extremities still show trace pitting edema.  Chronic hyperpigmentation changes also present. -Patient currently on Lasix 20 mg daily -Encourage use of TED hose -Encourage elevation of lower extremities  Frequent urination Patient reports increased urinary frequency and volume, as well as moderate leakage with increased abdominal pressure.  Patient  previously treated for UTI in 04/2020 with Keflex. -U/A today did not show signs of UTI -Continue to monitor symptoms for 1-2 weeks, if no improvement can consider another antibiotic course.      05/2020, DO [facility]

## 2020-06-21 ENCOUNTER — Ambulatory Visit: Payer: PPO

## 2020-06-21 ENCOUNTER — Other Ambulatory Visit: Payer: Self-pay

## 2020-06-21 ENCOUNTER — Ambulatory Visit: Payer: PPO | Admitting: Podiatry

## 2020-06-21 ENCOUNTER — Encounter: Payer: Self-pay | Admitting: Podiatry

## 2020-06-21 DIAGNOSIS — B351 Tinea unguium: Secondary | ICD-10-CM | POA: Diagnosis not present

## 2020-06-21 DIAGNOSIS — M79672 Pain in left foot: Secondary | ICD-10-CM | POA: Diagnosis not present

## 2020-06-21 DIAGNOSIS — M79675 Pain in left toe(s): Secondary | ICD-10-CM

## 2020-06-21 DIAGNOSIS — M79671 Pain in right foot: Secondary | ICD-10-CM

## 2020-06-21 DIAGNOSIS — M79674 Pain in right toe(s): Secondary | ICD-10-CM | POA: Diagnosis not present

## 2020-06-21 NOTE — Progress Notes (Signed)
  Subjective:  Patient ID: Leslie Dean, female    DOB: [birth year],  MRN: [id number]  Chief Complaint  Patient presents with  . routine foot care    Nail trim    75 y.o. female returns for the above complaint.  Patient presents with thickened elongated dystrophic toenails x10.  Patient states that mildly painful to touch.  Patient would like to have them debrided down she is not able to do it herself.  She denies any other acute complaints.  She has had a history of stroke and has some weakness.  Objective:  There were no vitals filed for this visit. Podiatric Exam: Vascular: dorsalis pedis and posterior tibial pulses are palpable bilateral. Capillary return is immediate. Temperature gradient is WNL. Skin turgor WNL  Sensorium: Normal Semmes Weinstein monofilament test. Normal tactile sensation bilaterally. Nail Exam: Pt has thick disfigured discolored nails with subungual debris noted bilateral entire nail hallux through fifth toenails.  Pain on palpation to the nails. Ulcer Exam: There is no evidence of ulcer or pre-ulcerative changes or infection. Orthopedic Exam: Muscle tone and strength are WNL. No limitations in general ROM. No crepitus or effusions noted. HAV  B/L.  Hammer toes 2-5  B/L. Skin: No Porokeratosis. No infection or ulcers    Assessment & Plan:   1. Pain in both feet   2. Pain due to onychomycosis of toenails of both feet     Patient was evaluated and treated and all questions answered.  Onychomycosis with pain  -Nails palliatively debrided as below. -Educated on self-care  Procedure: Nail Debridement Rationale: pain  Type of Debridement: manual, sharp debridement. Instrumentation: Nail nipper, rotary burr. Number of Nails: 10  Procedures and Treatment: Consent by patient was obtained for treatment procedures. The patient understood the discussion of treatment and procedures well. All questions were answered thoroughly reviewed. Debridement of mycotic and  hypertrophic toenails, 1 through 5 bilateral and clearing of subungual debris. No ulceration, no infection noted.  Return Visit-Office Procedure: Patient instructed to return to the office for a follow up visit 3 months for continued evaluation and treatment.  Nicholes Rough, DPM    No follow-ups on file.

## 2020-06-27 DIAGNOSIS — M25461 Effusion, right knee: Secondary | ICD-10-CM | POA: Diagnosis not present

## 2020-06-27 DIAGNOSIS — S7292XD Unspecified fracture of left femur, subsequent encounter for closed fracture with routine healing: Secondary | ICD-10-CM | POA: Diagnosis not present

## 2020-06-27 DIAGNOSIS — J9601 Acute respiratory failure with hypoxia: Secondary | ICD-10-CM | POA: Diagnosis not present

## 2020-06-27 DIAGNOSIS — J9600 Acute respiratory failure, unspecified whether with hypoxia or hypercapnia: Secondary | ICD-10-CM | POA: Diagnosis not present

## 2020-06-27 DIAGNOSIS — M6281 Muscle weakness (generalized): Secondary | ICD-10-CM | POA: Diagnosis not present

## 2020-06-27 DIAGNOSIS — M8000XA Age-related osteoporosis with current pathological fracture, unspecified site, initial encounter for fracture: Secondary | ICD-10-CM | POA: Diagnosis not present

## 2020-06-27 DIAGNOSIS — S2232XD Fracture of one rib, left side, subsequent encounter for fracture with routine healing: Secondary | ICD-10-CM | POA: Diagnosis not present

## 2020-06-27 DIAGNOSIS — Z4789 Encounter for other orthopedic aftercare: Secondary | ICD-10-CM | POA: Diagnosis not present

## 2020-06-27 DIAGNOSIS — Q8740 Marfan's syndrome, unspecified: Secondary | ICD-10-CM | POA: Diagnosis not present

## 2020-07-03 ENCOUNTER — Other Ambulatory Visit: Payer: Self-pay | Admitting: Family Medicine

## 2020-07-03 ENCOUNTER — Inpatient Hospital Stay: Payer: PPO | Admitting: Neurology

## 2020-07-03 DIAGNOSIS — Z780 Asymptomatic menopausal state: Secondary | ICD-10-CM

## 2020-07-28 DIAGNOSIS — S2232XD Fracture of one rib, left side, subsequent encounter for fracture with routine healing: Secondary | ICD-10-CM | POA: Diagnosis not present

## 2020-07-28 DIAGNOSIS — J9600 Acute respiratory failure, unspecified whether with hypoxia or hypercapnia: Secondary | ICD-10-CM | POA: Diagnosis not present

## 2020-07-28 DIAGNOSIS — J9601 Acute respiratory failure with hypoxia: Secondary | ICD-10-CM | POA: Diagnosis not present

## 2020-07-28 DIAGNOSIS — Z4789 Encounter for other orthopedic aftercare: Secondary | ICD-10-CM | POA: Diagnosis not present

## 2020-07-28 DIAGNOSIS — Q8740 Marfan's syndrome, unspecified: Secondary | ICD-10-CM | POA: Diagnosis not present

## 2020-07-28 DIAGNOSIS — S7292XD Unspecified fracture of left femur, subsequent encounter for closed fracture with routine healing: Secondary | ICD-10-CM | POA: Diagnosis not present

## 2020-07-28 DIAGNOSIS — M8000XA Age-related osteoporosis with current pathological fracture, unspecified site, initial encounter for fracture: Secondary | ICD-10-CM | POA: Diagnosis not present

## 2020-07-28 DIAGNOSIS — M25461 Effusion, right knee: Secondary | ICD-10-CM | POA: Diagnosis not present

## 2020-07-28 DIAGNOSIS — M6281 Muscle weakness (generalized): Secondary | ICD-10-CM | POA: Diagnosis not present

## 2020-08-11 DIAGNOSIS — R7303 Prediabetes: Secondary | ICD-10-CM | POA: Diagnosis not present

## 2020-08-11 DIAGNOSIS — I10 Essential (primary) hypertension: Secondary | ICD-10-CM | POA: Diagnosis not present

## 2020-08-11 DIAGNOSIS — E785 Hyperlipidemia, unspecified: Secondary | ICD-10-CM | POA: Diagnosis not present

## 2020-08-25 DIAGNOSIS — J9600 Acute respiratory failure, unspecified whether with hypoxia or hypercapnia: Secondary | ICD-10-CM | POA: Diagnosis not present

## 2020-08-25 DIAGNOSIS — S2232XD Fracture of one rib, left side, subsequent encounter for fracture with routine healing: Secondary | ICD-10-CM | POA: Diagnosis not present

## 2020-08-25 DIAGNOSIS — M25461 Effusion, right knee: Secondary | ICD-10-CM | POA: Diagnosis not present

## 2020-08-25 DIAGNOSIS — M8000XA Age-related osteoporosis with current pathological fracture, unspecified site, initial encounter for fracture: Secondary | ICD-10-CM | POA: Diagnosis not present

## 2020-08-25 DIAGNOSIS — Q8740 Marfan's syndrome, unspecified: Secondary | ICD-10-CM | POA: Diagnosis not present

## 2020-08-25 DIAGNOSIS — J9601 Acute respiratory failure with hypoxia: Secondary | ICD-10-CM | POA: Diagnosis not present

## 2020-08-25 DIAGNOSIS — M6281 Muscle weakness (generalized): Secondary | ICD-10-CM | POA: Diagnosis not present

## 2020-08-25 DIAGNOSIS — S7292XD Unspecified fracture of left femur, subsequent encounter for closed fracture with routine healing: Secondary | ICD-10-CM | POA: Diagnosis not present

## 2020-08-25 DIAGNOSIS — Z4789 Encounter for other orthopedic aftercare: Secondary | ICD-10-CM | POA: Diagnosis not present

## 2020-09-20 ENCOUNTER — Ambulatory Visit: Payer: PPO | Admitting: Podiatry

## 2020-09-20 ENCOUNTER — Other Ambulatory Visit: Payer: Self-pay

## 2020-09-20 ENCOUNTER — Encounter: Payer: Self-pay | Admitting: Podiatry

## 2020-09-20 DIAGNOSIS — M79675 Pain in left toe(s): Secondary | ICD-10-CM

## 2020-09-20 DIAGNOSIS — M79674 Pain in right toe(s): Secondary | ICD-10-CM

## 2020-09-20 DIAGNOSIS — M792 Neuralgia and neuritis, unspecified: Secondary | ICD-10-CM | POA: Diagnosis not present

## 2020-09-20 DIAGNOSIS — B351 Tinea unguium: Secondary | ICD-10-CM | POA: Diagnosis not present

## 2020-09-20 NOTE — Progress Notes (Signed)
  Subjective:  Patient ID: Leslie Dean, female    DOB: June 09, 1946,  MRN: [id number]  Chief Complaint  Patient presents with  . Nail Problem    Nail trim    75 y.o. female returns for the above complaint.  Patient presents with thickened elongated dystrophic toenails x10.  Patient states that mildly painful to touch.  Patient would like to have them debrided down she is not able to do it herself.  She denies any other acute complaints.  She has had a history of stroke and has some weakness.  She also states that she has had some numbness tingling to all of her toes seem to have gotten worse over this past few weeks.  She wants to know what the etiology of that is.  She has not tried any conservative treatment options she denies doing any thing for it.  Objective:  There were no vitals filed for this visit. Podiatric Exam: Vascular: dorsalis pedis and posterior tibial pulses are palpable bilateral. Capillary return is immediate. Temperature gradient is WNL. Skin turgor WNL  Sensorium: Decreased Semmes Weinstein monofilament test to the distal digits.  Decreased tactile sensation bilaterally. Nail Exam: Pt has thick disfigured discolored nails with subungual debris noted bilateral entire nail hallux through fifth toenails.  Pain on palpation to the nails. Ulcer Exam: There is no evidence of ulcer or pre-ulcerative changes or infection. Orthopedic Exam: Muscle tone and strength are WNL. No limitations in general ROM. No crepitus or effusions noted. HAV  B/L.  Hammer toes 2-5  B/L. Skin: No Porokeratosis. No infection or ulcers    Assessment & Plan:   1. Neuritis   2. Pain due to onychomycosis of toenails of both feet     Patient was evaluated and treated and all questions answered.  Neuritis both feet to digits -I explained the patient the etiology of neuritis and various treatment options were extensively discussed.  This likely is attributed from lower back as she does have history  of stenosis and arthritis.  I discussed with her that she may benefit from neuropathic cream or she can even try applying some Voltaren gel that can help relieve some of the pain.  She states understanding and will think about doing some of these topical medications.  Onychomycosis with pain  -Nails palliatively debrided as below. -Educated on self-care  Procedure: Nail Debridement Rationale: pain  Type of Debridement: manual, sharp debridement. Instrumentation: Nail nipper, rotary burr. Number of Nails: 10  Procedures and Treatment: Consent by patient was obtained for treatment procedures. The patient understood the discussion of treatment and procedures well. All questions were answered thoroughly reviewed. Debridement of mycotic and hypertrophic toenails, 1 through 5 bilateral and clearing of subungual debris. No ulceration, no infection noted.  Return Visit-Office Procedure: Patient instructed to return to the office for a follow up visit 3 months for continued evaluation and treatment.  Nicholes Rough, DPM    Return in about 3 months (around 12/20/2020).

## 2020-11-03 DIAGNOSIS — H5203 Hypermetropia, bilateral: Secondary | ICD-10-CM | POA: Diagnosis not present

## 2020-11-03 DIAGNOSIS — H27112 Subluxation of lens, left eye: Secondary | ICD-10-CM | POA: Diagnosis not present

## 2020-11-09 ENCOUNTER — Ambulatory Visit: Payer: PPO | Admitting: Adult Health

## 2020-12-08 ENCOUNTER — Ambulatory Visit (INDEPENDENT_AMBULATORY_CARE_PROVIDER_SITE_OTHER): Payer: PPO | Admitting: Family Medicine

## 2020-12-08 ENCOUNTER — Encounter: Payer: Self-pay | Admitting: Family Medicine

## 2020-12-08 ENCOUNTER — Other Ambulatory Visit: Payer: Self-pay

## 2020-12-08 VITALS — BP 140/75 | HR 77 | Ht 66.0 in | Wt 211.2 lb

## 2020-12-08 DIAGNOSIS — Z1159 Encounter for screening for other viral diseases: Secondary | ICD-10-CM | POA: Diagnosis not present

## 2020-12-08 DIAGNOSIS — R300 Dysuria: Secondary | ICD-10-CM | POA: Diagnosis not present

## 2020-12-08 DIAGNOSIS — I10 Essential (primary) hypertension: Secondary | ICD-10-CM | POA: Diagnosis not present

## 2020-12-08 DIAGNOSIS — Z862 Personal history of diseases of the blood and blood-forming organs and certain disorders involving the immune mechanism: Secondary | ICD-10-CM | POA: Diagnosis not present

## 2020-12-08 NOTE — Progress Notes (Signed)
    SUBJECTIVE:   Chief compliant/HPI: annual examination  Leslie Dean is a 75 y.o. who presents today for an annual exam.   Review of systems form notable for odorous urine with some occasional burning with urination and decreased amount.. Decreased energy level  Updated history tabs and problem list .   Wants a UA due to foul odor with occasional dysuria, and wants A1c done. Energy level is decreased.   OBJECTIVE:   BP 140/75   Pulse 77   Ht _0  (1.676 m)   Wt 211 lb 3.2 oz (95.8 kg)   SpO2 94%   BMI 34.09 kg/m   Gen: well-appearing, NAD CV: RRR, no m/r/g appreciated, no peripheral edema Pulm: CTAB, no wheezes/crackles GI: soft, non-tender, non-distended Extremities: BLE swelling 1+ pitting edema.  ASSESSMENT/PLAN:   Fatigue with history of anemia Increased fatigue, unsure if related to an iron deficiency related picture. Will check labs today but consider other causes including thyroid dysfunction, infection. - CBC and iron panel (TIBC, ferritin, iron)  HTN Stable on Losartan 68m daily and metoprolol. BP today 140/75. - Check BMP to monitor kidney function  Dysuria Patient previously had UTI growing proteus mirabilis. - Urinalysis, if suspicious for UTI will likely treat with Keflex   Annual Examination  See AVS for age appropriate recommendations.   PHQ score 11 (moderate depression), reviewed and discussed. Blood pressure reviewed and at goal 140/75.  Advanced directives - Yes   Considered the following items based upon USPSTF recommendations: HIV testing:  previously done Hepatitis C: ordered Hepatitis B: discussed Syphilis if at high risk: discussed GC/CT not at high risk and not ordered. Lipid panel (nonfasting or fasting) discussed based upon AHA recommendations and ordered.  Consider repeat every 4-6 years.  Reviewed risk factors for latent tuberculosis and not indicated  Discussed family history, BRCA testing not indicated.   Patient  declines that she will ever get mammogram or colonoscopy.   Follow up in 1 year or sooner if indicated.    ARise Patience DWeidman

## 2020-12-08 NOTE — Patient Instructions (Signed)
We will collect labs as well as a urinalysis.  We will call you if there are any abnormal results, if there are normal results we will mail you a letter.

## 2020-12-09 LAB — CBC
Hematocrit: 40 % (ref 34.0–46.6)
Hemoglobin: 12.9 g/dL (ref 11.1–15.9)
MCH: 28.7 pg (ref 26.6–33.0)
MCHC: 32.3 g/dL (ref 31.5–35.7)
MCV: 89 fL (ref 79–97)
Platelets: 231 10*3/uL (ref 150–450)
RBC: 4.5 x10E6/uL (ref 3.77–5.28)
RDW: 13.7 % (ref 11.7–15.4)
WBC: 10.5 10*3/uL (ref 3.4–10.8)

## 2020-12-09 LAB — URINALYSIS
Bilirubin, UA: NEGATIVE
Glucose, UA: NEGATIVE
Nitrite, UA: NEGATIVE
RBC, UA: NEGATIVE
Specific Gravity, UA: 1.021 (ref 1.005–1.030)
Urobilinogen, Ur: 0.2 mg/dL (ref 0.2–1.0)
pH, UA: 5.5 (ref 5.0–7.5)

## 2020-12-09 LAB — BASIC METABOLIC PANEL
BUN/Creatinine Ratio: 17 (ref 12–28)
BUN: 16 mg/dL (ref 8–27)
CO2: 23 mmol/L (ref 20–29)
Calcium: 9.5 mg/dL (ref 8.7–10.3)
Chloride: 104 mmol/L (ref 96–106)
Creatinine, Ser: 0.93 mg/dL (ref 0.57–1.00)
Glucose: 96 mg/dL (ref 65–99)
Potassium: 4.5 mmol/L (ref 3.5–5.2)
Sodium: 141 mmol/L (ref 134–144)
eGFR: 64 mL/min/{1.73_m2} (ref >59)

## 2020-12-09 LAB — IRON,TIBC AND FERRITIN PANEL
Ferritin: 195 ng/mL — ABNORMAL HIGH (ref 15–150)
Iron Saturation: 16 % (ref 15–55)
Iron: 47 ug/dL (ref 27–139)
Total Iron Binding Capacity: 295 ug/dL (ref 250–450)
UIBC: 248 ug/dL (ref 118–369)

## 2020-12-09 LAB — HCV INTERPRETATION

## 2020-12-09 LAB — HCV AB W REFLEX TO QUANT PCR: HCV Ab: <0.1 s/co ratio (ref 0.0–0.9)

## 2020-12-10 ENCOUNTER — Telehealth: Payer: Self-pay | Admitting: Family Medicine

## 2020-12-10 DIAGNOSIS — N3000 Acute cystitis without hematuria: Secondary | ICD-10-CM

## 2020-12-10 MED ORDER — CEPHALEXIN 500 MG PO CAPS: 500.0000 mg | ORAL_CAPSULE | Freq: Three times a day (TID) | ORAL | 0 refills | Status: AC

## 2020-12-10 NOTE — Telephone Encounter (Signed)
Called patient about urinalysis results with leukocytes and protein present, concerning for a possible UTI in the setting of her previously reported symptoms. Patient has previously grown Proteus on culture. Will see if we can add a culture on once the office opens, but unsure if we will be able to. Will initiate antibiotic course with Keflex q8h x5 days. Return precautions given.  Patient would also like to talk about her knee pain in future appointments.  Darey Hershberger, DO

## 2021-01-02 ENCOUNTER — Other Ambulatory Visit: Payer: Self-pay

## 2021-01-02 ENCOUNTER — Encounter: Payer: Self-pay | Admitting: Podiatry

## 2021-01-02 ENCOUNTER — Ambulatory Visit: Payer: PPO | Admitting: Podiatry

## 2021-01-02 DIAGNOSIS — B351 Tinea unguium: Secondary | ICD-10-CM | POA: Diagnosis not present

## 2021-01-02 DIAGNOSIS — M79674 Pain in right toe(s): Secondary | ICD-10-CM

## 2021-01-02 DIAGNOSIS — M79675 Pain in left toe(s): Secondary | ICD-10-CM | POA: Diagnosis not present

## 2021-01-02 NOTE — Progress Notes (Signed)
This patient returns to the office for evaluation and treatment of long thick painful nails .  This patient is unable to trim his own nails since the patient cannot reach his feet.  Patient says the nails are painful walking and wearing his shoes.  He returns for preventive foot care services.  General Appearance  Alert, conversant and in no acute stress.  Vascular  Dorsalis pedis and posterior tibial  pulses are palpable  bilaterally.  Capillary return is within normal limits  bilaterally. Temperature is within normal limits  bilaterally.  Neurologic  Senn-Weinstein monofilament wire test diminished bilaterally. Muscle power within normal limits bilaterally.  Nails Thick disfigured discolored nails with subungual debris  from hallux to fifth toes bilaterally. No evidence of bacterial infection or drainage bilaterally.  Orthopedic  No limitations of motion  feet .  No crepitus or effusions noted.  No bony pathology or digital deformities noted.  Skin  normotropic skin with no porokeratosis noted bilaterally.  No signs of infections or ulcers noted.     Onychomycosis  Pain in toes right foot  Pain in toes left foot  Debridement  of nails  1-5  B/L with a nail nipper.  Nails were then filed using a dremel tool with no incidents.    RTC 3 months   Helane Gunther DPM

## 2021-04-06 ENCOUNTER — Ambulatory Visit: Payer: PPO | Admitting: Podiatry

## 2021-05-09 ENCOUNTER — Ambulatory Visit: Payer: PPO | Admitting: Podiatry

## 2021-05-09 ENCOUNTER — Encounter: Payer: Self-pay | Admitting: Podiatry

## 2021-05-09 ENCOUNTER — Other Ambulatory Visit: Payer: Self-pay

## 2021-05-09 DIAGNOSIS — M79674 Pain in right toe(s): Secondary | ICD-10-CM | POA: Diagnosis not present

## 2021-05-09 DIAGNOSIS — B351 Tinea unguium: Secondary | ICD-10-CM

## 2021-05-09 DIAGNOSIS — M79675 Pain in left toe(s): Secondary | ICD-10-CM | POA: Diagnosis not present

## 2021-05-09 NOTE — Progress Notes (Signed)
This patient returns to the office for evaluation and treatment of long thick painful nails .  This patient is unable to trim his own nails since the patient cannot reach his feet.  Patient says the nails are painful walking and wearing his shoes.  He returns for preventive foot care services.  General Appearance  Alert, conversant and in no acute stress.  Vascular  Dorsalis pedis and posterior tibial  pulses are palpable  bilaterally.  Capillary return is within normal limits  bilaterally. Temperature is within normal limits  bilaterally.  Neurologic  Senn-Weinstein monofilament wire test diminished bilaterally. Muscle power within normal limits bilaterally.  Nails Thick disfigured discolored nails with subungual debris  from hallux to fifth toes bilaterally. No evidence of bacterial infection or drainage bilaterally.  Orthopedic  No limitations of motion  feet .  No crepitus or effusions noted.  No bony pathology or digital deformities noted.  Skin  normotropic skin with no porokeratosis noted bilaterally.  No signs of infections or ulcers noted.     Onychomycosis  Pain in toes right foot  Pain in toes left foot  Debridement  of nails  1-5  B/L with a nail nipper.  Nails were then filed using a dremel tool with no incidents.    RTC 3 months   Helane Gunther DPM

## 2021-05-10 NOTE — Progress Notes (Addendum)
    SUBJECTIVE:   CHIEF COMPLAINT / HPI: urinary symptoms  Reports urinary frequency and dysuria for 2 weeks. Reports low-grade temp initially but no recent fevers. Has back pain at baseline which is not worse. Denies nausea, vomiting, vaginal discharge.  Also reports chronic left knee pain for 9 months which she believes is from arthritis. Morning stiffness lasting less than 30 minutes. She is unsure if there is swelling. No concern for joint locking or giving out. Using Tylenol as needed for pain. No injuries. No other joint concerns today. She had a left hip fracture requiring surgery in 2021.  PERTINENT  PMH / PSH: Marfan syndrome, HTN, TIA, closed left hip fracture 2021  OBJECTIVE:   BP (!) 132/51   Pulse 71   Ht 5\' 6"  (1.676 m)   Wt 207 lb 6.4 oz (94.1 kg)   SpO2 96%   BMI 33.48 kg/m   General: alert, NAD, ambulating with walker CV: RRR, no murmurs Pulm: CTAB, no wheezes or rales Abdomen: no CVA tenderness MSK: no obvious deformity or swelling to left knee. No tenderness to palpation. Mild pain elicited with extension. Strength is full.  ASSESSMENT/PLAN:   Chronic pain of left knee Likely OA based on history. - XR left knee standing - diclofenac gel prn - home exercises, can refer to PT if patient desires   Cystitis Classic UTI symptoms though UA equivocal with trace leukocytes. No evidence of complicated infection. Will treat for UTI given high likelihood. - cephalexin TID x 7 days - f/u urine culture  , MD [facility]

## 2021-05-10 NOTE — Patient Instructions (Addendum)
It was nice seeing you today!  Try doing knee exercises at least 5 days of the week if you are able to.  Try voltaren gel up to 4 times a day as needed.  Go here for your X-ray. [facility] Address: [address], [place], [place] [address] Phone: [phone]  Take antibiotics three times a day for 7 days. I will let you know if we need to change antibiotics.  Please arrive at least 15 minutes prior to your scheduled appointments.  Stay well, Littie Deeds, MD [facility] [phone]

## 2021-05-11 ENCOUNTER — Encounter: Payer: Self-pay | Admitting: Family Medicine

## 2021-05-11 ENCOUNTER — Ambulatory Visit (INDEPENDENT_AMBULATORY_CARE_PROVIDER_SITE_OTHER): Payer: PPO | Admitting: Family Medicine

## 2021-05-11 ENCOUNTER — Other Ambulatory Visit: Payer: Self-pay

## 2021-05-11 ENCOUNTER — Ambulatory Visit
Admission: RE | Admit: 2021-05-11 | Discharge: 2021-05-11 | Disposition: A | Discharge status: Home / Self Care | Payer: PPO | Source: Ambulatory Visit | Attending: Family Medicine | Admitting: Family Medicine

## 2021-05-11 VITALS — BP 132/51 | HR 71 | Ht 66.0 in | Wt 207.4 lb

## 2021-05-11 DIAGNOSIS — R300 Dysuria: Secondary | ICD-10-CM | POA: Diagnosis not present

## 2021-05-11 DIAGNOSIS — M25562 Pain in left knee: Secondary | ICD-10-CM

## 2021-05-11 DIAGNOSIS — N3090 Cystitis, unspecified without hematuria: Secondary | ICD-10-CM | POA: Diagnosis not present

## 2021-05-11 DIAGNOSIS — G8929 Other chronic pain: Secondary | ICD-10-CM | POA: Diagnosis not present

## 2021-05-11 LAB — POCT URINALYSIS DIP (CLINITEK)
Blood, UA: NEGATIVE
Glucose, UA: NEGATIVE mg/dL
Ketones, POC UA: NEGATIVE mg/dL
Nitrite, UA: NEGATIVE
POC PROTEIN,UA: 30 — AB
Spec Grav, UA: >=1.03 — AB (ref 1.010–1.025)
Urobilinogen, UA: 0.2 E.U./dL
pH, UA: 5.5 (ref 5.0–8.0)

## 2021-05-11 MED ORDER — CEPHALEXIN 500 MG PO CAPS: 500.0000 mg | ORAL_CAPSULE | Freq: Three times a day (TID) | ORAL | 0 refills | Status: AC

## 2021-05-11 MED ORDER — DICLOFENAC SODIUM 1 % EX GEL: 4.0000 g | Freq: Four times a day (QID) | CUTANEOUS | 0 refills | Status: DC | PRN

## 2021-05-11 NOTE — Assessment & Plan Note (Addendum)
Likely OA based on history. - XR left knee standing - diclofenac gel prn - home exercises, can refer to PT if patient desires

## 2021-05-14 DIAGNOSIS — I10 Essential (primary) hypertension: Secondary | ICD-10-CM | POA: Diagnosis not present

## 2021-05-14 DIAGNOSIS — E785 Hyperlipidemia, unspecified: Secondary | ICD-10-CM | POA: Diagnosis not present

## 2021-05-14 DIAGNOSIS — R7303 Prediabetes: Secondary | ICD-10-CM | POA: Diagnosis not present

## 2021-05-15 ENCOUNTER — Ambulatory Visit: Payer: PPO | Admitting: Family Medicine

## 2021-05-15 LAB — URINE CULTURE

## 2021-07-21 ENCOUNTER — Other Ambulatory Visit: Payer: Self-pay | Admitting: Family Medicine

## 2021-07-21 DIAGNOSIS — G459 Transient cerebral ischemic attack, unspecified: Secondary | ICD-10-CM

## 2021-08-01 ENCOUNTER — Telehealth: Payer: Self-pay

## 2021-08-01 NOTE — Telephone Encounter (Signed)
Patient LVM on nurse line requesting to schedule appointment for back pain. Attempted to call patient back, no answer. Left VM for patient to return call to office to schedule appointment.   When patient calls back, please assist with scheduling appointment.   Veronda Prude, RN

## 2021-08-02 DIAGNOSIS — M5441 Lumbago with sciatica, right side: Secondary | ICD-10-CM | POA: Diagnosis not present

## 2021-08-08 ENCOUNTER — Ambulatory Visit: Payer: PPO | Admitting: Podiatry

## 2021-08-08 ENCOUNTER — Encounter: Payer: Self-pay | Admitting: Podiatry

## 2021-08-08 ENCOUNTER — Other Ambulatory Visit: Payer: Self-pay

## 2021-08-08 DIAGNOSIS — M79675 Pain in left toe(s): Secondary | ICD-10-CM

## 2021-08-08 DIAGNOSIS — M79671 Pain in right foot: Secondary | ICD-10-CM

## 2021-08-08 DIAGNOSIS — M79672 Pain in left foot: Secondary | ICD-10-CM | POA: Diagnosis not present

## 2021-08-08 DIAGNOSIS — B351 Tinea unguium: Secondary | ICD-10-CM | POA: Diagnosis not present

## 2021-08-08 DIAGNOSIS — M79674 Pain in right toe(s): Secondary | ICD-10-CM | POA: Diagnosis not present

## 2021-08-08 NOTE — Progress Notes (Signed)
This patient returns to the office for evaluation and treatment of long thick painful nails .  This patient is unable to trim his own nails since the patient cannot reach his feet.  Patient says the nails are painful walking and wearing his shoes.  He returns for preventive foot care services.  General Appearance  Alert, conversant and in no acute stress.  Vascular  Dorsalis pedis and posterior tibial  pulses are palpable  bilaterally.  Capillary return is within normal limits  bilaterally. Temperature is within normal limits  bilaterally.  Neurologic  Senn-Weinstein monofilament wire test diminished bilaterally. Muscle power within normal limits bilaterally.  Nails Thick disfigured discolored nails with subungual debris  from hallux to fifth toes bilaterally. No evidence of bacterial infection or drainage bilaterally.  Orthopedic  No limitations of motion  feet .  No crepitus or effusions noted.  No bony pathology or digital deformities noted.  Skin  normotropic skin with no porokeratosis noted bilaterally.  No signs of infections or ulcers noted.     Onychomycosis  Pain in toes right foot  Pain in toes left foot  Debridement  of nails  1-5  B/L with a nail nipper.  Nails were then filed using a dremel tool with no incidents.    RTC 3 months   Leslie Dean DPM   

## 2021-08-13 DIAGNOSIS — M545 Low back pain, unspecified: Secondary | ICD-10-CM | POA: Diagnosis not present

## 2021-08-15 DIAGNOSIS — M48061 Spinal stenosis, lumbar region without neurogenic claudication: Secondary | ICD-10-CM | POA: Diagnosis not present

## 2021-08-23 DIAGNOSIS — M48061 Spinal stenosis, lumbar region without neurogenic claudication: Secondary | ICD-10-CM | POA: Diagnosis not present

## 2021-09-10 DIAGNOSIS — M48061 Spinal stenosis, lumbar region without neurogenic claudication: Secondary | ICD-10-CM | POA: Diagnosis not present

## 2021-09-14 DIAGNOSIS — Z780 Asymptomatic menopausal state: Secondary | ICD-10-CM | POA: Diagnosis not present

## 2021-09-14 DIAGNOSIS — E559 Vitamin D deficiency, unspecified: Secondary | ICD-10-CM | POA: Diagnosis not present

## 2021-09-14 DIAGNOSIS — M810 Age-related osteoporosis without current pathological fracture: Secondary | ICD-10-CM | POA: Diagnosis not present

## 2021-09-14 DIAGNOSIS — M85831 Other specified disorders of bone density and structure, right forearm: Secondary | ICD-10-CM | POA: Diagnosis not present

## 2021-09-14 DIAGNOSIS — R5383 Other fatigue: Secondary | ICD-10-CM | POA: Diagnosis not present

## 2021-10-11 DIAGNOSIS — M810 Age-related osteoporosis without current pathological fracture: Secondary | ICD-10-CM | POA: Diagnosis not present

## 2021-11-07 DIAGNOSIS — M810 Age-related osteoporosis without current pathological fracture: Secondary | ICD-10-CM | POA: Diagnosis not present

## 2021-11-07 DIAGNOSIS — E559 Vitamin D deficiency, unspecified: Secondary | ICD-10-CM | POA: Diagnosis not present

## 2021-11-09 ENCOUNTER — Ambulatory Visit: Payer: PPO | Admitting: Podiatry

## 2021-11-09 ENCOUNTER — Encounter: Payer: Self-pay | Admitting: Podiatry

## 2021-11-09 DIAGNOSIS — M48061 Spinal stenosis, lumbar region without neurogenic claudication: Secondary | ICD-10-CM | POA: Diagnosis not present

## 2021-11-09 DIAGNOSIS — B351 Tinea unguium: Secondary | ICD-10-CM

## 2021-11-09 DIAGNOSIS — M79675 Pain in left toe(s): Secondary | ICD-10-CM

## 2021-11-09 DIAGNOSIS — M79674 Pain in right toe(s): Secondary | ICD-10-CM

## 2021-11-09 NOTE — Progress Notes (Signed)
This patient returns to the office for evaluation and treatment of long thick painful nails .  This patient is unable to trim her own nails since the patient cannot reach her feet.  Patient says the nails are painful walking and wearing his shoes.   She presents to the office in a wheelchair.   He returns for preventive foot care services.  General Appearance  Alert, conversant and in no acute stress.  Vascular  Dorsalis pedis and posterior tibial  pulses are palpable  bilaterally.  Capillary return is within normal limits  bilaterally. Temperature is within normal limits  bilaterally.  Neurologic  Senn-Weinstein monofilament wire test diminished bilaterally. Muscle power within normal limits bilaterally.  Nails Thick disfigured discolored nails with subungual debris  from hallux to fifth toes bilaterally. No evidence of bacterial infection or drainage bilaterally.  Orthopedic  No limitations of motion  feet .  No crepitus or effusions noted.  No bony pathology or digital deformities noted.  Skin  normotropic skin with no porokeratosis noted bilaterally.  No signs of infections or ulcers noted.     Onychomycosis  Pain in toes right foot  Pain in toes left foot  Debridement  of nails  1-5  B/L with a nail nipper.  Nails were then filed using a dremel tool with no incidents.    RTC 3 months   Helane Gunther DPM

## 2021-11-13 ENCOUNTER — Encounter: Payer: Self-pay | Admitting: *Deleted

## 2021-12-13 DIAGNOSIS — M48061 Spinal stenosis, lumbar region without neurogenic claudication: Secondary | ICD-10-CM | POA: Diagnosis not present

## 2021-12-13 DIAGNOSIS — M4120 Other idiopathic scoliosis, site unspecified: Secondary | ICD-10-CM | POA: Diagnosis not present

## 2021-12-13 DIAGNOSIS — M810 Age-related osteoporosis without current pathological fracture: Secondary | ICD-10-CM | POA: Diagnosis not present

## 2022-01-04 DIAGNOSIS — M533 Sacrococcygeal disorders, not elsewhere classified: Secondary | ICD-10-CM | POA: Diagnosis not present

## 2022-01-05 IMAGING — DX DG KNEE COMPLETE 4+V*R*
4 series · 4 of 4 positions shown · non-contrast
Comparison: None.

CLINICAL DATA: Right joint effusion

EXAM:
RIGHT KNEE - COMPLETE 4+ VIEW

[knee ap]
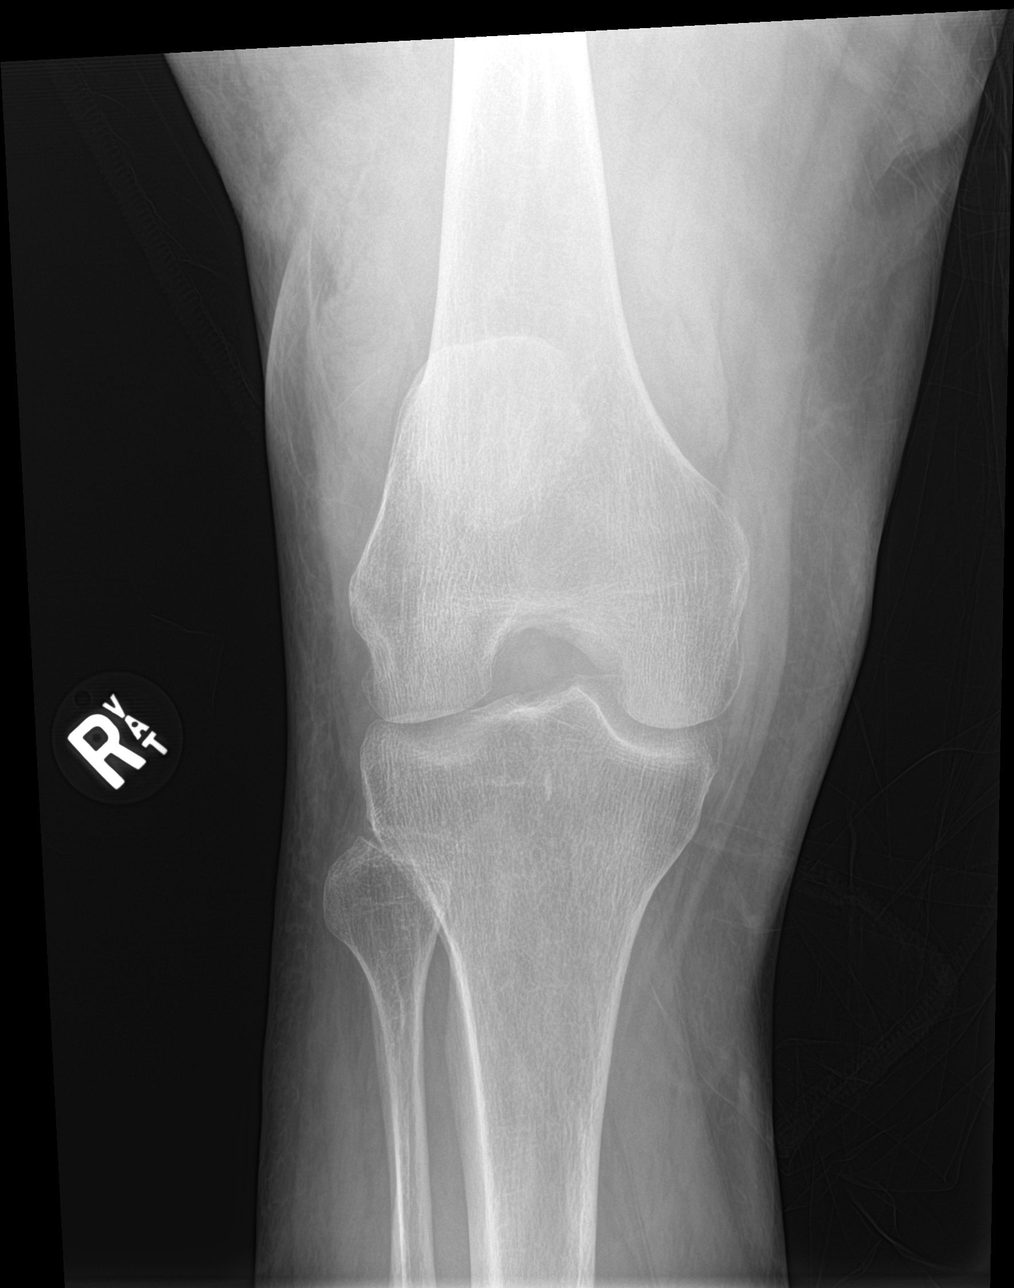

[knee lat]
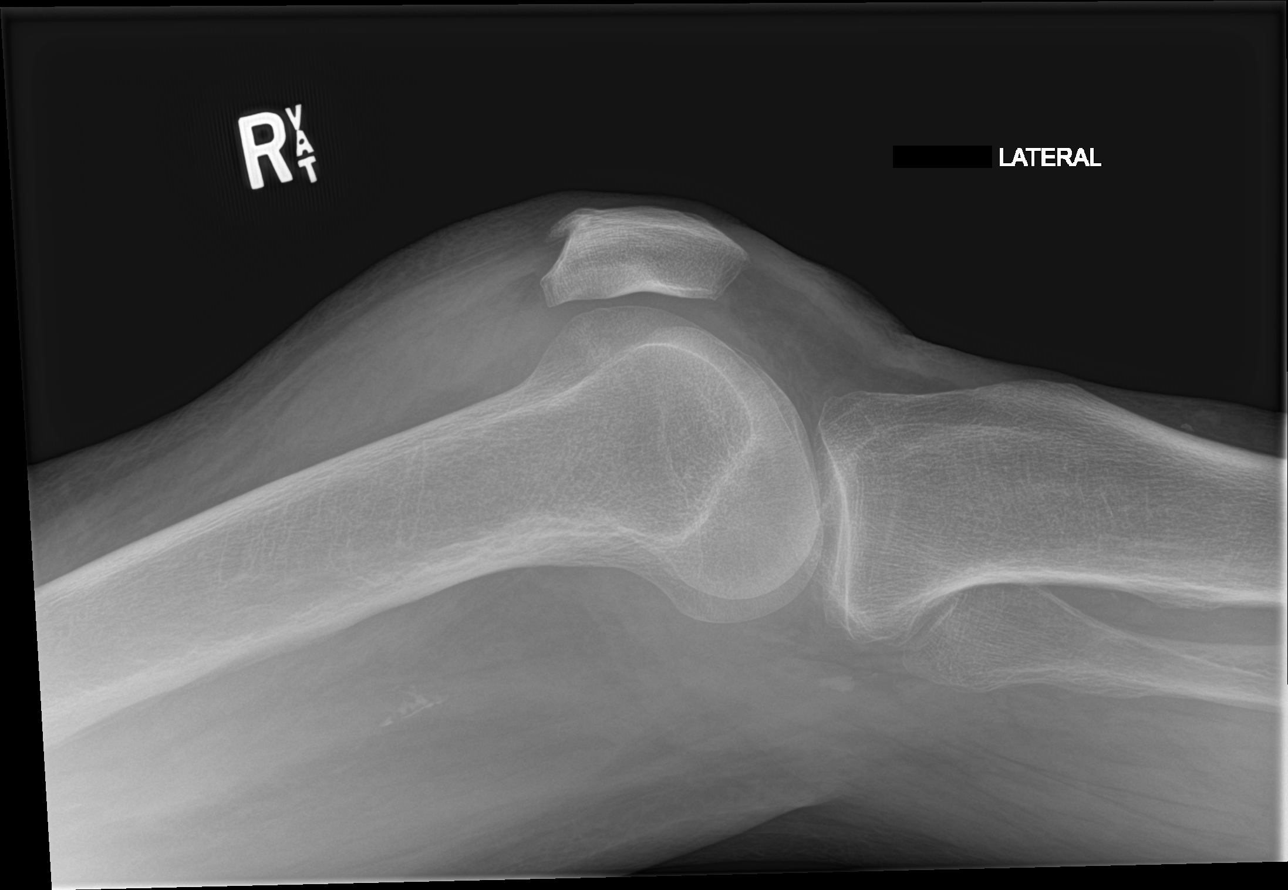

[knee obl (1 of 2)]
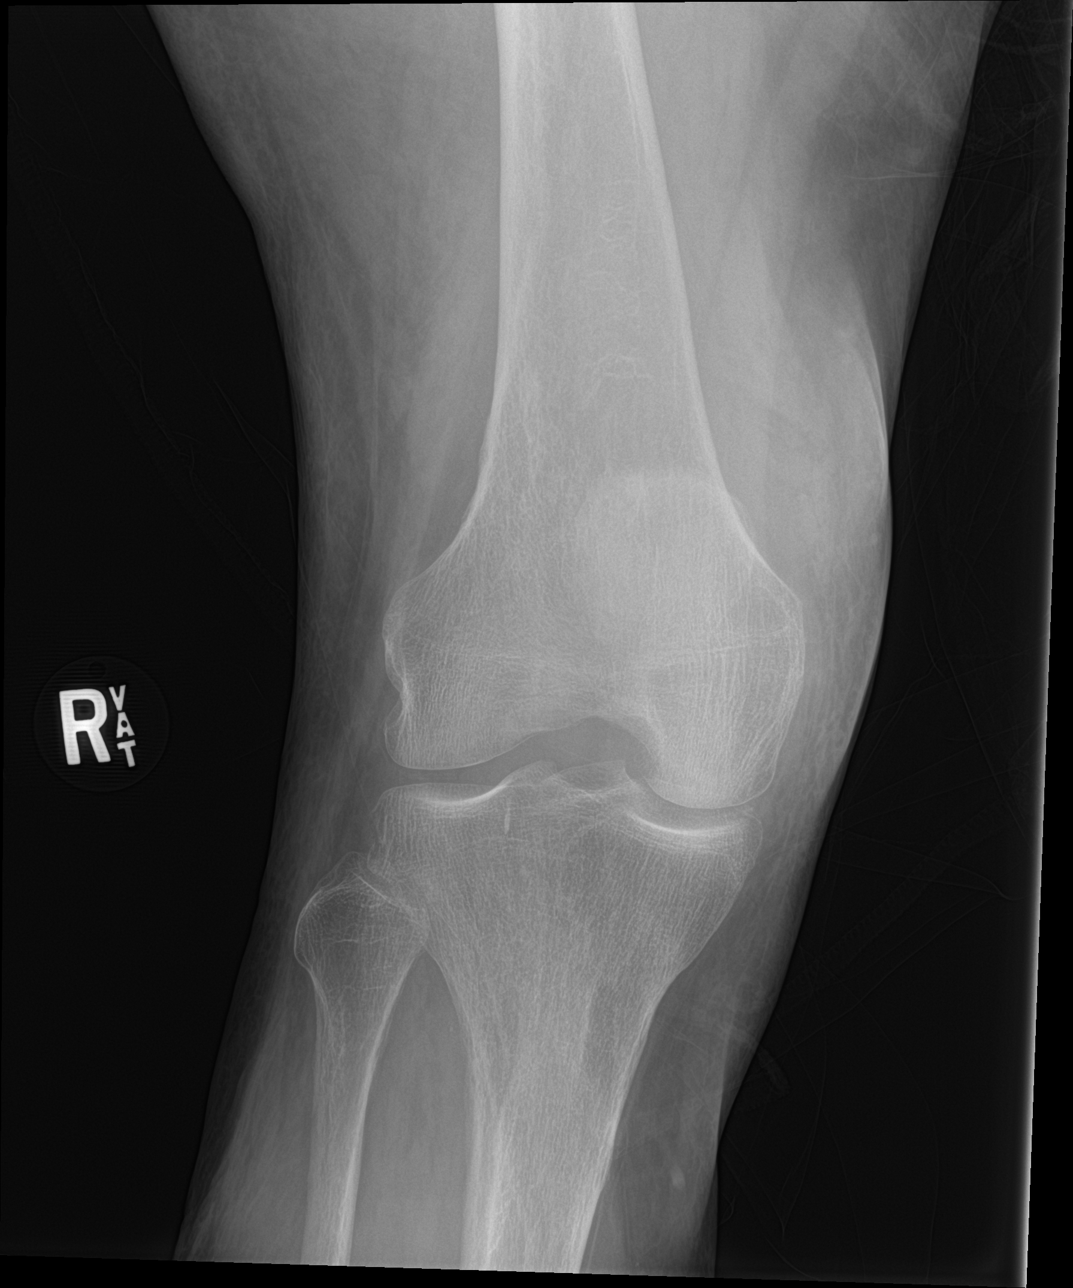

[knee obl (2 of 2)]
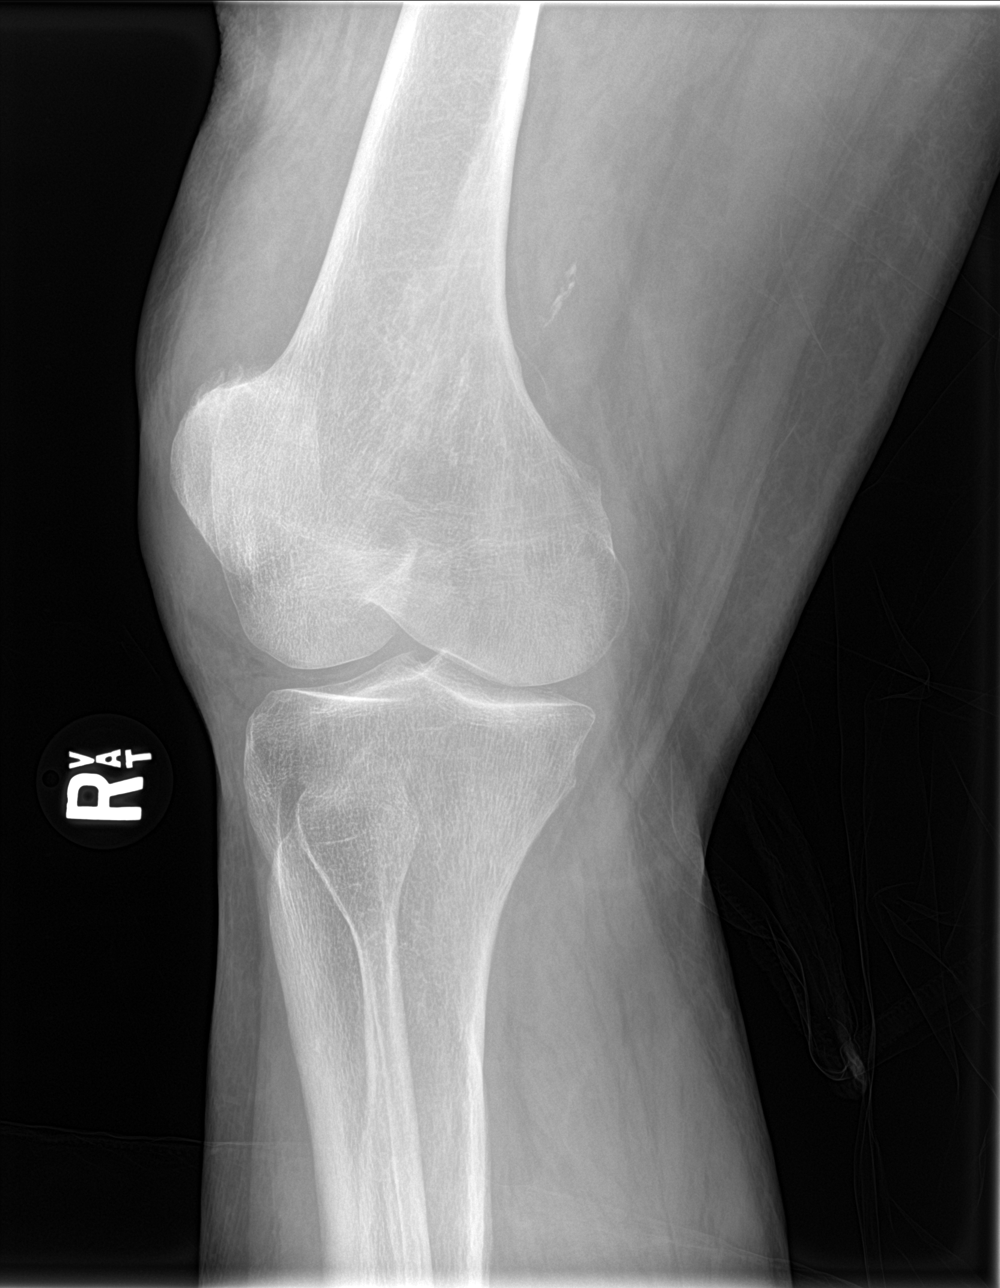

[4 of 4 positions shown; findings below may reference images not displayed]

FINDINGS: Moderate right joint effusion. No acute bony abnormality.
Specifically, no fracture, subluxation, or dislocation. Joint spaces
are maintained. Soft tissues are intact.
IMPRESSION: Moderate right joint effusion.  No acute bony abnormality.

## 2022-01-28 ENCOUNTER — Other Ambulatory Visit: Payer: Self-pay | Admitting: *Deleted

## 2022-02-13 ENCOUNTER — Telehealth: Payer: Self-pay | Admitting: *Deleted

## 2022-02-14 ENCOUNTER — Encounter: Payer: Self-pay | Admitting: *Deleted

## 2022-02-14 ENCOUNTER — Telehealth: Payer: Self-pay | Admitting: *Deleted

## 2022-02-14 NOTE — Patient Outreach (Signed)
  Care Coordination   Initial Visit Note   02/14/2022 Name: Leslie Dean MRN: [id number] DOB: [birth year]  Leslie Dean is a 76 y.o. year old female who sees Lilland, Percival Spanish, DO for primary care. I spoke with  [name] by phone today.  What matters to the patients health and wellness today?  Nothing to be addressed, pt is a retired Public relations account executive for herself.  Pt states she has not had an AWV - encouraged her to make this appt.  SDOH assessments and interventions completed:  Yes  SDOH Interventions Today    Flowsheet Row Most Recent Value  SDOH Interventions   Food Insecurity Interventions Intervention Not Indicated  Transportation Interventions Intervention Not Indicated        Care Coordination Interventions Activated:  Yes  Care Coordination Interventions:  Yes, provided   Follow up plan: No further intervention required.   Encounter Outcome:  Pt. Refused   Zilah Villaflor C. Burgess Estelle, MSN, [facility] Gerontological Nurse Practitioner [facility] Care Management [phone]

## 2022-02-15 ENCOUNTER — Ambulatory Visit: Payer: PPO | Admitting: Podiatry

## 2022-02-19 ENCOUNTER — Ambulatory Visit: Payer: PPO | Admitting: Podiatry

## 2022-02-19 ENCOUNTER — Encounter: Payer: Self-pay | Admitting: Podiatry

## 2022-02-19 DIAGNOSIS — B351 Tinea unguium: Secondary | ICD-10-CM | POA: Diagnosis not present

## 2022-02-19 DIAGNOSIS — M79675 Pain in left toe(s): Secondary | ICD-10-CM | POA: Diagnosis not present

## 2022-02-19 DIAGNOSIS — M79674 Pain in right toe(s): Secondary | ICD-10-CM | POA: Diagnosis not present

## 2022-02-19 NOTE — Progress Notes (Signed)
This patient returns to the office for evaluation and treatment of long thick painful nails .  This patient is unable to trim her own nails since the patient cannot reach her feet.  Patient says the nails are painful walking and wearing his shoes.   She presents to the office in a wheelchair.   She returns for preventive foot care services.  General Appearance  Alert, conversant and in no acute stress.  Vascular  Dorsalis pedis and posterior tibial  pulses are palpable  bilaterally.  Capillary return is within normal limits  bilaterally. Temperature is within normal limits  bilaterally.  Neurologic  Senn-Weinstein monofilament wire test diminished bilaterally. Muscle power within normal limits bilaterally.  Nails Thick disfigured discolored nails with subungual debris  from hallux to fifth toes bilaterally. No evidence of bacterial infection or drainage bilaterally.  Orthopedic  No limitations of motion  feet .  No crepitus or effusions noted.  No bony pathology or digital deformities noted.  Skin  normotropic skin with no porokeratosis noted bilaterally.  No signs of infections or ulcers noted.     Onychomycosis  Pain in toes right foot  Pain in toes left foot  Debridement  of nails  1-5  B/L with a nail nipper.  Nails were then filed using a dremel tool with no incidents.    RTC 3 months  Louann Sjogren  DPM

## 2022-02-20 DIAGNOSIS — M533 Sacrococcygeal disorders, not elsewhere classified: Secondary | ICD-10-CM | POA: Diagnosis not present

## 2022-02-20 DIAGNOSIS — M48061 Spinal stenosis, lumbar region without neurogenic claudication: Secondary | ICD-10-CM | POA: Diagnosis not present

## 2022-02-20 DIAGNOSIS — M4120 Other idiopathic scoliosis, site unspecified: Secondary | ICD-10-CM | POA: Diagnosis not present

## 2022-03-07 ENCOUNTER — Telehealth: Payer: Self-pay

## 2022-03-07 NOTE — Telephone Encounter (Signed)
Patient calls nurse line with complaints of bilateral leg cramps. This has been going on for the last two days. She reports that the only pain medication that works for her is tramadol.   Advised that she would need an appointment for this refill. She has previously spoken with front office staff, who scheduled patient for tomorrow morning.   Provided with supportive measures. Patient continues to ask if medication can possibly sent in prior to appointment.   Will forward to PCP.   Talbot Grumbling, RN

## 2022-03-08 ENCOUNTER — Ambulatory Visit: Payer: PPO

## 2022-03-11 ENCOUNTER — Other Ambulatory Visit (HOSPITAL_COMMUNITY): Payer: Self-pay | Admitting: Family Medicine

## 2022-03-11 ENCOUNTER — Ambulatory Visit (INDEPENDENT_AMBULATORY_CARE_PROVIDER_SITE_OTHER): Payer: PPO | Admitting: Family Medicine

## 2022-03-11 VITALS — BP 102/58 | HR 66 | Wt 200.0 lb

## 2022-03-11 DIAGNOSIS — M79605 Pain in left leg: Secondary | ICD-10-CM

## 2022-03-11 DIAGNOSIS — M48061 Spinal stenosis, lumbar region without neurogenic claudication: Secondary | ICD-10-CM

## 2022-03-11 DIAGNOSIS — M79604 Pain in right leg: Secondary | ICD-10-CM

## 2022-03-11 DIAGNOSIS — Z23 Encounter for immunization: Secondary | ICD-10-CM | POA: Diagnosis not present

## 2022-03-11 MED ORDER — PREGABALIN 25 MG PO CAPS: 25.0000 mg | ORAL_CAPSULE | Freq: Two times a day (BID) | ORAL | 0 refills | Status: DC

## 2022-03-11 NOTE — Assessment & Plan Note (Signed)
Spinal injection is being managed by PMR.  Has had previous prescriptions for tramadol and recently prescribed Lyrica 75 mg but does not tolerate that dose. - Trial of Lyrica 25 mg, with plans to slowly titrate up from 1 time night dosing to twice daily if tolerated

## 2022-03-11 NOTE — Patient Instructions (Addendum)
It was so great seeing you today! Today we discussed the following:  - For the leg pain, I am going to start you on a lower dose of the Lyrica.  You can take this lower dose at night for 3 to 5 nights to see how well you do with it and then can try it out during the day once daily for 3 days and then twice daily afterwards.  If you are not tolerating, please let me know.  - I am going to send you to get testing of your legs to check and see if the pain you are having is either related to blood flow or if it is related to nerve pain from your back.  - Today your blood work and other mild areas of chronic diseases, we will have to have you come back for an regular physical as this was an acute visit today.   Please make sure to bring any medications you take to your appointments. If you have any questions or concerns please call the office at [phone].

## 2022-03-11 NOTE — Progress Notes (Addendum)
/     SUBJECTIVE:   CHIEF COMPLAINT / HPI:   Bilateral leg/feet pain - Sees PMR for spinal injections - Previously prescribed tramadol and recently Lyrica 75 mg - Not taking the Lyrica as the dosing makes her too dizzy and she is at home alone during the day is afraid of falls - Pain and swelling worsened about 2 weeks ago now complaining with skin color changes and pain - Has baseline swelling in BLE - Tylenol not helping with pain - Does have baseline cramping sensation in her legs at night, Biofreeze helps - Right leg cramping now continuing throughout the day  PERTINENT  PMH / PSH: Lumbar spinal stenosis, lumbago with sciatica  OBJECTIVE:   BP (!) 102/58   Pulse 66   Wt 200 lb (90.7 kg)   SpO2 96%   BMI 32.28 kg/m   General: NAD, well-appearing, well-nourished Respiratory: No respiratory distress, breathing comfortably, able to speak in full sentences Extremities: 2+ peripheral edema in BLE up to knees, mild pain with palpation along bilateral feet, bluish/reddish discoloration along the medial left foot more prominent than the right.  No obvious ulcers present.  Discoloration of the feet is blanchable upon palpation. Difficult to palpate pulses distally due to degree of edema Skin: warm and dry, no rashes noted on exposed skin Psych: Appropriate affect and mood   ASSESSMENT/PLAN:   Spinal stenosis of lumbar region without neurogenic claudication Spinal injection is being managed by PMR.  Has had previous prescriptions for tramadol and recently prescribed Lyrica 75 mg but does not tolerate that dose. - Trial of Lyrica 25 mg, with plans to slowly titrate up from 1 time night dosing to twice daily if tolerated   Bilateral feet/leg pain Differential includes vascular versus neurogenic pain.  Given the spinal stenosis as well as the dermatologic changes that are present on the feet, will need to obtain Dopplers to determine if vascular surgery referral would be appropriate  versus conservative treatment with neurogenic pain agents. - Decrease pregabalin to 25 mg with titration plans given to patient - Bilateral arterial Dopplers - Consider vascular surgery with abnormal Doppler flow - Follow-up in 2 weeks for follow-up and physical   Leslie Dean, [place]

## 2022-03-18 ENCOUNTER — Encounter (HOSPITAL_COMMUNITY): Payer: PPO

## 2022-03-18 ENCOUNTER — Ambulatory Visit (HOSPITAL_COMMUNITY)
Admission: RE | Admit: 2022-03-18 | Discharge: 2022-03-18 | Disposition: A | Discharge status: Home / Self Care | Payer: PPO | Source: Ambulatory Visit | Attending: Family Medicine | Admitting: Family Medicine

## 2022-03-18 DIAGNOSIS — M79605 Pain in left leg: Secondary | ICD-10-CM | POA: Insufficient documentation

## 2022-03-18 DIAGNOSIS — M48061 Spinal stenosis, lumbar region without neurogenic claudication: Secondary | ICD-10-CM | POA: Insufficient documentation

## 2022-03-18 DIAGNOSIS — M79604 Pain in right leg: Secondary | ICD-10-CM | POA: Insufficient documentation

## 2022-03-18 NOTE — Progress Notes (Signed)
ABI has been completed.   Preliminary results in CV Proc.   Leslie Dean 03/18/2022 9:32 AM

## 2022-03-27 ENCOUNTER — Ambulatory Visit (INDEPENDENT_AMBULATORY_CARE_PROVIDER_SITE_OTHER): Payer: PPO | Admitting: Family Medicine

## 2022-03-27 ENCOUNTER — Encounter: Payer: Self-pay | Admitting: Family Medicine

## 2022-03-27 VITALS — BP 124/82 | HR 68 | Ht 66.0 in | Wt 204.0 lb

## 2022-03-27 DIAGNOSIS — I10 Essential (primary) hypertension: Secondary | ICD-10-CM

## 2022-03-27 DIAGNOSIS — M48061 Spinal stenosis, lumbar region without neurogenic claudication: Secondary | ICD-10-CM

## 2022-03-27 NOTE — Progress Notes (Signed)
    SUBJECTIVE:   CHIEF COMPLAINT / HPI:   Patient with history spinal stenosis presents for follow up regarding her bilateral lower extremity pain. Follows up at [facility] every 3 months for spinal injections. But now feels like the pain is better since her last visit with PCP. She was instructed to return for a regular follow up.   Hypertension Denies chest pain, dyspnea and worsening leg swelling. Compliant on losartan and metoprolol, tolerating both well.   Patient compliant on atorvastatin, tolerating medication well. Prior lipid panel in 2021 notable for decreased HDL. Due for lipid panel today.    OBJECTIVE:   BP 124/82   Pulse 68   Ht 5\' 6"  (1.676 m)   Wt 204 lb (92.5 kg)   SpO2 100%   BMI 32.93 kg/m    General: Patient well-appearing, in no acute distress. CV: RRR, no murmurs or gallops  Resp: CTAB, no wheezing, rales or rhonchi, breathing comfortably on room air  MSK: full passive ROM of LE bilaterally, no gross abnormality noted  Ext: bilateral LE edema noted which patient states is her baseline, distal pulses strong and equal bilaterally  Neuro: 5/5 LE strength bilaterally, gross sensation intact, ambulates with assistance of rolling walker  ASSESSMENT/PLAN:   HTN (hypertension) -BP 124/82, normotensive and well-controlled at goal -pending BMP to ensure electrolytes and renal function are within normal limits -continue current hypertensive regimen -continue to follow up with PCP as appropriate   Spinal stenosis of lumbar region without neurogenic claudication -chronic and stable, no acute changes or red flag symptoms at this time -continue to follow up with PCP and Raliegh Ip for ongoing, chronic care   Decreased HDL -prior lipid panel notable for HDL of 37 -due for lipid panel, obtained and pending -continue statin, patient seems to be tolerating this well  -Med rec reviewed and updated appropriately   Donney Dice, [place]

## 2022-03-27 NOTE — Patient Instructions (Signed)
It was great seeing you today!  Today we discussed your leg pain, I am glad that this is improving. If you start to experience chest pain or shortness of breath then please return or go to the emergency department.  Your blood pressure looks great, please continue to take all your medications as prescribed. We will get blood work today to check your electrolytes, kidney function and cholesterol.   Please follow up with Dr. Oleh Genin in 2 months.   Please follow up at your next scheduled appointment in 2 months, if anything arises between now and then, please don't hesitate to contact our office.   Thank you for allowing [place] to be a part of your medical care!  Thank you, Dr. Larae Grooms

## 2022-03-28 LAB — LIPID PANEL
Chol/HDL Ratio: 2.4 ratio (ref 0.0–4.4)
Cholesterol, Total: 129 mg/dL (ref 100–199)
HDL: 54 mg/dL (ref >39)
LDL Chol Calc (NIH): 60 mg/dL (ref 0–99)
Triglycerides: 74 mg/dL (ref 0–149)
VLDL Cholesterol Cal: 15 mg/dL (ref 5–40)

## 2022-03-28 LAB — BASIC METABOLIC PANEL
BUN/Creatinine Ratio: 22 (ref 12–28)
BUN: 19 mg/dL (ref 8–27)
CO2: 24 mmol/L (ref 20–29)
Calcium: 9.5 mg/dL (ref 8.7–10.3)
Chloride: 105 mmol/L (ref 96–106)
Creatinine, Ser: 0.85 mg/dL (ref 0.57–1.00)
Glucose: 97 mg/dL (ref 70–99)
Potassium: 5.2 mmol/L (ref 3.5–5.2)
Sodium: 142 mmol/L (ref 134–144)
eGFR: 71 mL/min/{1.73_m2} (ref >59)

## 2022-03-28 NOTE — Assessment & Plan Note (Addendum)
-  chronic and stable, no acute changes or red flag symptoms at this time -continue to follow up with PCP and Raliegh Ip for ongoing, chronic care

## 2022-03-28 NOTE — Assessment & Plan Note (Signed)
-  BP 124/82, normotensive and well-controlled at goal -pending BMP to ensure electrolytes and renal function are within normal limits -continue current hypertensive regimen -continue to follow up with PCP as appropriate

## 2022-04-10 ENCOUNTER — Ambulatory Visit (INDEPENDENT_AMBULATORY_CARE_PROVIDER_SITE_OTHER): Payer: PPO | Admitting: Family Medicine

## 2022-04-10 VITALS — BP 127/80 | HR 67 | Temp 98.2°F

## 2022-04-10 DIAGNOSIS — R5383 Other fatigue: Secondary | ICD-10-CM | POA: Insufficient documentation

## 2022-04-10 DIAGNOSIS — R600 Localized edema: Secondary | ICD-10-CM

## 2022-04-10 NOTE — Assessment & Plan Note (Signed)
B/l 2+ lower extremity edema. Lungs clear and does not endorse orthopnea or PND. Could be component of venous insufficiency vs HF. Reports not taking her Lasix.  Previous ABIs performed by her PCP which showed abnormal toe brachial index but normal bilateral ankle-brachial index indicative of microvascular issues. Last echo was in 03/2020 which showed an EF of 55 to 60% with grade 1 diastolic dysfunction.  -Check BNP  -Recommend leg elevation and compression socks

## 2022-04-10 NOTE — Progress Notes (Signed)
    SUBJECTIVE:   CHIEF COMPLAINT / HPI:   Leslie Dean is a 76 y.o. female who presents to the [facility] clinic today to discuss the following concerns:   Cold Symptoms For the last two weeks she has been "feeling bad". She reports feeling fatigued, has a hoarse voice, has dry cough. Denies shortness of breath. No true fevers. She has tried Mucinex which helped with the cough. She is mostly concerned that she is still feeling fatigued and her voice is still hoarse. She thinks that possibly she was exposed to [place], her daughter tested positive about 2-3 days ago. Sakara states that she has not had close contact with her daughter.   She is COVID vaccinated with one booster. She reports having had influenza vaccine this year.   PERTINENT  PMH / PSH: HTN, [place]   OBJECTIVE:   BP 127/80   Pulse 67   Temp 98.2 F (36.8 C) (Axillary)   SpO2 96%    General: NAD, pleasant, able to participate in exam HEENT: Conjunctival pallor appreciated, oropharynx without erythema or tonsillar exudates Cardiac: RRR, no murmurs. Respiratory: CTAB, normal effort, No wheezes, rales or rhonchi Abdomen: Obese abdomen  Extremities: 2+ pitting edema b/l lower extremities to mid-shin  Skin: warm and dry, pallor Psych: Normal affect and mood  ASSESSMENT/PLAN:   Fatigue Ongoing symptoms for the last 2 weeks.  This may be sequela from recent viral infection (given recent cough and hoarse voice), did not test for COVID given that she would be out of the window for treatment.  Other differentials include anemia, electrolyte abnormalities, thyroid abnormalities, sleep apnea. Examination notable for conjunctival pallor and pitting edema to lower legs b/l.  -Check CBC, CMP, TSH, BNP   Lower extremity edema B/l 2+ lower extremity edema. Lungs clear and does not endorse orthopnea or PND. Could be component of venous insufficiency vs HF. Reports not taking her Lasix.  Previous ABIs performed by her PCP which showed  abnormal toe brachial index but normal bilateral ankle-brachial index indicative of microvascular issues. Last echo was in 03/2020 which showed an EF of 55 to 60% with grade 1 diastolic dysfunction.  -Check BNP  -Recommend leg elevation and compression socks    Sharion Settler, DO [place]

## 2022-04-10 NOTE — Patient Instructions (Addendum)
It was wonderful to see you today.  Please bring ALL of your medications with you to every visit.   Today we talked about:  We are doing lab work today to check your blood count, check for fluid overload and your thyroid. I will send you a MyChart message if you have MyChart. Otherwise, I will give you a call for abnormal results or send a letter if everything returned back normal. If you don't hear from me in 2 weeks, please call the office.    To find a therapist visit [email or web address]. At this website you will be able to filter which providers take your click your insurance as well as other filters to fit your needs. For medicine management please call your insurance to find psychiatrist in network   Thank you for coming to your visit as scheduled. We have had a large "no-show" problem lately, and this significantly limits our ability to see and care for patients. As a friendly reminder- if you cannot make your appointment please call to cancel. We do have a no show policy for those who do not cancel within 24 hours. Our policy is that if you miss or fail to cancel an appointment within 24 hours, 3 times in a 88-month period, you may be dismissed from our clinic.   Thank you for choosing [facility].   Please call [phone] with any questions about today's appointment.  Please be sure to schedule follow up at the front  desk before you leave today.   Sharion Settler, DO PGY-3 Family Medicine

## 2022-04-10 NOTE — Assessment & Plan Note (Addendum)
Ongoing symptoms for the last 2 weeks.  This may be sequela from recent viral infection (given recent cough and hoarse voice), did not test for COVID given that she would be out of the window for treatment.  Other differentials include anemia, electrolyte abnormalities, thyroid abnormalities, sleep apnea. Examination notable for conjunctival pallor and pitting edema to lower legs b/l.  -Check CBC, CMP, TSH, BNP

## 2022-04-11 LAB — COMPREHENSIVE METABOLIC PANEL
ALT: 13 IU/L (ref 0–32)
AST: 17 IU/L (ref 0–40)
Albumin/Globulin Ratio: 1.5 (ref 1.2–2.2)
Albumin: 3.5 g/dL — ABNORMAL LOW (ref 3.8–4.8)
Alkaline Phosphatase: 64 IU/L (ref 44–121)
BUN/Creatinine Ratio: 27 (ref 12–28)
BUN: 26 mg/dL (ref 8–27)
Bilirubin Total: 0.5 mg/dL (ref 0.0–1.2)
CO2: 24 mmol/L (ref 20–29)
Calcium: 8.6 mg/dL — ABNORMAL LOW (ref 8.7–10.3)
Chloride: 105 mmol/L (ref 96–106)
Creatinine, Ser: 0.97 mg/dL (ref 0.57–1.00)
Globulin, Total: 2.3 g/dL (ref 1.5–4.5)
Glucose: 100 mg/dL — ABNORMAL HIGH (ref 70–99)
Potassium: 5 mmol/L (ref 3.5–5.2)
Sodium: 142 mmol/L (ref 134–144)
Total Protein: 5.8 g/dL — ABNORMAL LOW (ref 6.0–8.5)
eGFR: 61 mL/min/{1.73_m2} (ref >59)

## 2022-04-11 LAB — CBC
Hematocrit: 37.3 % (ref 34.0–46.6)
Hemoglobin: 12 g/dL (ref 11.1–15.9)
MCH: 29.1 pg (ref 26.6–33.0)
MCHC: 32.2 g/dL (ref 31.5–35.7)
MCV: 90 fL (ref 79–97)
Platelets: 198 10*3/uL (ref 150–450)
RBC: 4.13 x10E6/uL (ref 3.77–5.28)
RDW: 15.5 % — ABNORMAL HIGH (ref 11.7–15.4)
WBC: 5.4 10*3/uL (ref 3.4–10.8)

## 2022-04-11 LAB — BRAIN NATRIURETIC PEPTIDE: BNP: 75.4 pg/mL (ref 0.0–100.0)

## 2022-04-11 LAB — TSH RFX ON ABNORMAL TO FREE T4: TSH: 0.473 u[IU]/mL (ref 0.450–4.500)

## 2022-04-24 ENCOUNTER — Telehealth: Payer: Self-pay

## 2022-04-24 NOTE — Telephone Encounter (Signed)
Patient's daughter calls nurse line regarding continued weakness and dry cough. Patient was evaluated for this concern on 04/10/22.   Denies SHOB or fever.   Daughter is asking if patient should receive abx at this point due to persistent symptoms. Recommended that patient be re-evaluated. Offered appointment. Daughter is asking if this can be done virtually.   Please advise. Forwarding to PCP and Dr. Melba Coon who evaluated patient on 11/1.  Veronda Prude, RN

## 2022-04-25 DIAGNOSIS — S0502XA Injury of conjunctiva and corneal abrasion without foreign body, left eye, initial encounter: Secondary | ICD-10-CM | POA: Diagnosis not present

## 2022-04-25 NOTE — Telephone Encounter (Signed)
Returned call to [place]. She did not answer. LVM asking to return call to office to schedule appointment.   Veronda Prude, RN

## 2022-04-26 DIAGNOSIS — M48061 Spinal stenosis, lumbar region without neurogenic claudication: Secondary | ICD-10-CM | POA: Diagnosis not present

## 2022-04-29 DIAGNOSIS — S0502XD Injury of conjunctiva and corneal abrasion without foreign body, left eye, subsequent encounter: Secondary | ICD-10-CM | POA: Diagnosis not present

## 2022-05-09 DIAGNOSIS — E559 Vitamin D deficiency, unspecified: Secondary | ICD-10-CM | POA: Diagnosis not present

## 2022-05-09 DIAGNOSIS — R5383 Other fatigue: Secondary | ICD-10-CM | POA: Diagnosis not present

## 2022-05-09 DIAGNOSIS — M810 Age-related osteoporosis without current pathological fracture: Secondary | ICD-10-CM | POA: Diagnosis not present

## 2022-05-10 DIAGNOSIS — E785 Hyperlipidemia, unspecified: Secondary | ICD-10-CM | POA: Diagnosis not present

## 2022-05-10 DIAGNOSIS — R7303 Prediabetes: Secondary | ICD-10-CM | POA: Diagnosis not present

## 2022-05-10 DIAGNOSIS — I10 Essential (primary) hypertension: Secondary | ICD-10-CM | POA: Diagnosis not present

## 2022-05-15 DIAGNOSIS — M810 Age-related osteoporosis without current pathological fracture: Secondary | ICD-10-CM | POA: Diagnosis not present

## 2022-05-15 DIAGNOSIS — E559 Vitamin D deficiency, unspecified: Secondary | ICD-10-CM | POA: Diagnosis not present

## 2022-05-17 DIAGNOSIS — M533 Sacrococcygeal disorders, not elsewhere classified: Secondary | ICD-10-CM | POA: Diagnosis not present

## 2022-05-17 DIAGNOSIS — H04123 Dry eye syndrome of bilateral lacrimal glands: Secondary | ICD-10-CM | POA: Diagnosis not present

## 2022-05-21 ENCOUNTER — Ambulatory Visit: Payer: PPO | Admitting: Podiatry

## 2022-05-21 ENCOUNTER — Encounter: Payer: Self-pay | Admitting: Podiatry

## 2022-05-21 VITALS — BP 123/60

## 2022-05-21 DIAGNOSIS — B351 Tinea unguium: Secondary | ICD-10-CM

## 2022-05-21 DIAGNOSIS — M79674 Pain in right toe(s): Secondary | ICD-10-CM | POA: Diagnosis not present

## 2022-05-21 DIAGNOSIS — M79675 Pain in left toe(s): Secondary | ICD-10-CM

## 2022-05-21 NOTE — Progress Notes (Signed)
  Subjective:  Patient ID: Leslie Dean, female    DOB: 19-Jan-1946,  MRN: [id number]  Leslie Dean presents to clinic today for painful thick toenails that are difficult to trim. Pain interferes with ambulation. Aggravating factors include wearing enclosed shoe gear. Pain is relieved with periodic professional debridement.  Chief Complaint  Patient presents with   Nail Problem    RFC PCP-Liland,Alana PCP VST-6 weeks ago   New problem(s): None.   PCP is Lilland, Alana, DO.  Allergies  Allergen Reactions   Albuterol Sulfate    Hydromorphone Hcl    Other     Says narcotics cause nausea for her   Review of Systems: Negative except as noted in the HPI.  Objective: No changes noted in today's physical examination. Vitals:   05/21/22 0853  BP: 123/60   Leslie Dean is a pleasant 76 y.o. female obese in NAD. AAO x 3.  Vascular Examination: Capillary refill time immediate b/l. Vascular status intact b/l with palpable pedal pulses. Pedal hair present b/l. No edema. No pain with calf compression b/l. Skin temperature gradient WNL b/l.   Neurological Examination: Protective sensation diminished with 10g monofilament b/l.  Dermatological Examination: Pedal skin with normal turgor, texture and tone b/l. Toenails 1-5 b/l thick, discolored, elongated with subungual debris and pain on dorsal palpation. No open wounds b/l LE. No interdigital macerations noted b/l LE.  Musculoskeletal Examination: Normal muscle strength 5/5 to all lower extremity muscle groups bilaterally. No pain, crepitus or joint limitation noted with ROM b/l LE. No gross bony pedal deformities b/l. Patient ambulates independently without assistive aids.  Radiographs: None  Assessment/Plan: 1. Pain due to onychomycosis of toenails of both feet     No orders of the defined types were placed in this encounter.   -Patient was evaluated and treated. All patient's and/or POA's questions/concerns answered on  today's visit. -Continue supportive shoe gear daily. -Toenails 1-5 b/l were debrided in length and girth with sterile nail nippers and dremel without iatrogenic bleeding.  -Patient/POA to call should there be question/concern in the interim.   Return in about 3 months (around 08/20/2022).  Freddie Breech, DPM

## 2022-06-27 ENCOUNTER — Ambulatory Visit
Admission: RE | Admit: 2022-06-27 | Discharge: 2022-06-27 | Disposition: A | Discharge status: Home / Self Care | Payer: PPO | Source: Ambulatory Visit | Attending: Family Medicine | Admitting: Family Medicine

## 2022-06-27 ENCOUNTER — Ambulatory Visit (INDEPENDENT_AMBULATORY_CARE_PROVIDER_SITE_OTHER): Payer: PPO | Admitting: Family Medicine

## 2022-06-27 ENCOUNTER — Encounter: Payer: Self-pay | Admitting: Family Medicine

## 2022-06-27 VITALS — BP 110/78

## 2022-06-27 DIAGNOSIS — M7989 Other specified soft tissue disorders: Secondary | ICD-10-CM | POA: Diagnosis not present

## 2022-06-27 DIAGNOSIS — M25572 Pain in left ankle and joints of left foot: Secondary | ICD-10-CM

## 2022-06-27 DIAGNOSIS — M8000XA Age-related osteoporosis with current pathological fracture, unspecified site, initial encounter for fracture: Secondary | ICD-10-CM

## 2022-06-27 NOTE — Progress Notes (Signed)
    SUBJECTIVE:   CHIEF COMPLAINT / HPI:   Left ankle and leg pain Leslie Dean presents with her daughter with concern over new left ankle and leg pain. At baseline, she uses a walker for mobility aid. Tuesday night, she was walking like normal on a flat surface and stepped down onto her left foot - immediately felt pain radiate from ankle up to knee.   Since then, her leg has been swollen and she has pain with pressure or movement. The pain begins at the ankle and radiates superiorly up to knee. No pain at rest, only when she attempts to walk.   Pertinent PMH includes Marfan's and  osteoporosis. Sees Dr. Hal Morales at [facility] for osteoporosis injection medication and Dr. Ron Agee for cervical spine injections. Is on vitamin D and calcium supplementation.   PERTINENT  PMH / PSH:  Patient Active Problem List   Diagnosis Date Noted   Age-related osteoporosis with current pathological fracture 06/30/2022   Fatigue 04/10/2022   Spinal stenosis of lumbar region without neurogenic claudication 03/11/2022   Chronic pain of left knee 05/11/2021   Onychomycosis 06/08/2020   Lower extremity edema 05/29/2020   Bilateral ankle joint pain 05/24/2020   TIA (transient ischemic attack) 04/05/2020   Pain of left hip joint 11/25/2019   Pressure injury of skin 10/30/2019   Closed left hip fracture ([place]) 10/18/2019   Acute respiratory failure ([place]) 10/18/2019   HTN (hypertension) 12/03/2012   Marfans syndrome 12/03/2012    OBJECTIVE:   BP 110/78    Gen: awake, alert, NAD Resp: speaking clearly in full sentences, no respiratory distress Ext: BLE edema (left worse than right), erythematous skin changes (left worse then right), no TTP over left medial or lateral malleolus, no TTP over left navicular bone or base of 5th metatarsal, dorsal pedal pulses present bilaterally  ASSESSMENT/PLAN:   Age-related osteoporosis with current pathological fracture History and physical consistent with  insufficiency fracture of left ankle or midfoot. Ordered left ankle and foot XR to help diagnose. Suggest orthopedic boot while awaiting result. Even if XR negative, would recommend maintaining pressure offloading boot and follow up with her orthopedist in about 4 weeks. May need repeat XR to demonstrate insufficiency fracture.    [name], MD [place]

## 2022-06-27 NOTE — Patient Instructions (Signed)
It was wonderful to see you today. Thank you for allowing me to be a part of your care. Below is a short summary of what we discussed at your visit today:  Acute ankle pain Please go to [place] in the [facility] It is walk in hours through 4:30 or 5pm.  I will call you with the results as soon as I see them.  Keep weight off the foot until we know what is going on.  You may ice the ankle, ice tylenol as needed for pain.     If you have any questions or concerns, please do not hesitate to contact us via phone or MyChart message.   [name], MD

## 2022-06-28 ENCOUNTER — Telehealth: Payer: Self-pay | Admitting: Family Medicine

## 2022-06-28 DIAGNOSIS — S99912A Unspecified injury of left ankle, initial encounter: Secondary | ICD-10-CM

## 2022-06-28 NOTE — Telephone Encounter (Signed)
Will send for boot.  Follow up with her ortho in 4-6 weeks for repeat imaging.   [name], MD

## 2022-06-28 NOTE — Telephone Encounter (Signed)
Called to relay results. No acute fracture seen.  Given history and HPI, still high concern for fragility fracture. Will put in boot and have her follow up with her ortho in 4 weeks.   [name], MD

## 2022-06-29 DIAGNOSIS — M25572 Pain in left ankle and joints of left foot: Secondary | ICD-10-CM | POA: Diagnosis not present

## 2022-06-30 DIAGNOSIS — M8000XA Age-related osteoporosis with current pathological fracture, unspecified site, initial encounter for fracture: Secondary | ICD-10-CM | POA: Insufficient documentation

## 2022-06-30 NOTE — Assessment & Plan Note (Signed)
History and physical consistent with insufficiency fracture of left ankle or midfoot. Ordered left ankle and foot XR to help diagnose. Suggest orthopedic boot while awaiting result. Even if XR negative, would recommend maintaining pressure offloading boot and follow up with her orthopedist in about 4 weeks. May need repeat XR to demonstrate insufficiency fracture.

## 2022-07-01 NOTE — Telephone Encounter (Signed)
Community message sent to [employer]. Will await response.   Jeffie Widdowson C Rody Keadle, RN  

## 2022-07-03 DIAGNOSIS — M533 Sacrococcygeal disorders, not elsewhere classified: Secondary | ICD-10-CM | POA: Diagnosis not present

## 2022-07-03 DIAGNOSIS — M4120 Other idiopathic scoliosis, site unspecified: Secondary | ICD-10-CM | POA: Diagnosis not present

## 2022-07-16 DIAGNOSIS — M810 Age-related osteoporosis without current pathological fracture: Secondary | ICD-10-CM | POA: Diagnosis not present

## 2022-07-16 DIAGNOSIS — M25572 Pain in left ankle and joints of left foot: Secondary | ICD-10-CM | POA: Diagnosis not present

## 2022-07-24 DIAGNOSIS — M79662 Pain in left lower leg: Secondary | ICD-10-CM | POA: Diagnosis not present

## 2022-07-24 DIAGNOSIS — M25572 Pain in left ankle and joints of left foot: Secondary | ICD-10-CM | POA: Diagnosis not present

## 2022-07-25 ENCOUNTER — Other Ambulatory Visit: Payer: Self-pay | Admitting: Family Medicine

## 2022-07-25 DIAGNOSIS — G459 Transient cerebral ischemic attack, unspecified: Secondary | ICD-10-CM

## 2022-07-31 DIAGNOSIS — S86012A Strain of left Achilles tendon, initial encounter: Secondary | ICD-10-CM | POA: Diagnosis not present

## 2022-08-03 IMAGING — CR DG FOOT COMPLETE 3+V*L*
3 series · 3 of 3 positions shown · non-contrast
Comparison: None.

CLINICAL DATA: Lower extremity swelling bruising across the left
foot

EXAM:
LEFT FOOT - COMPLETE 3+ VIEW

[x foot ap left]
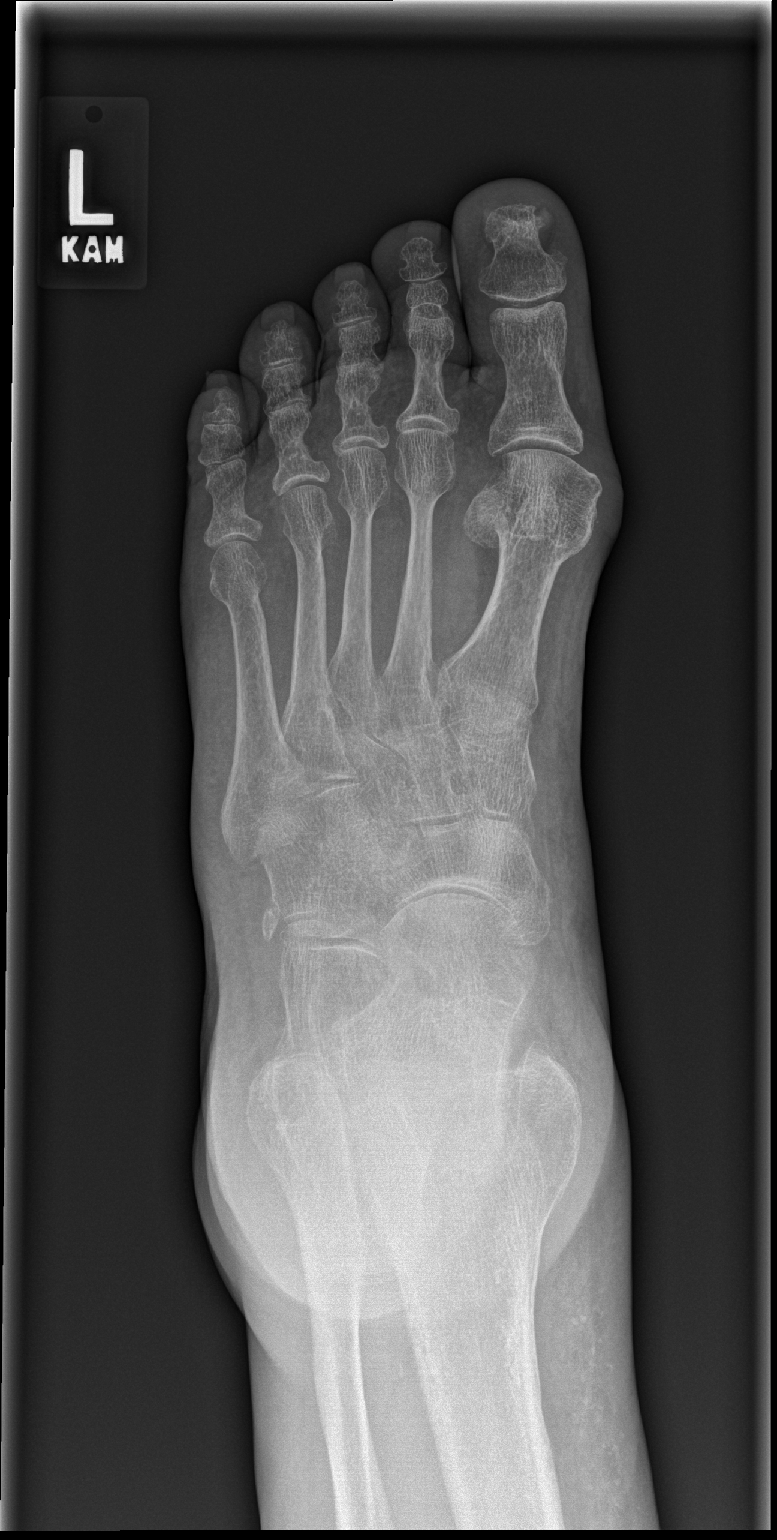

[x foot obl left]
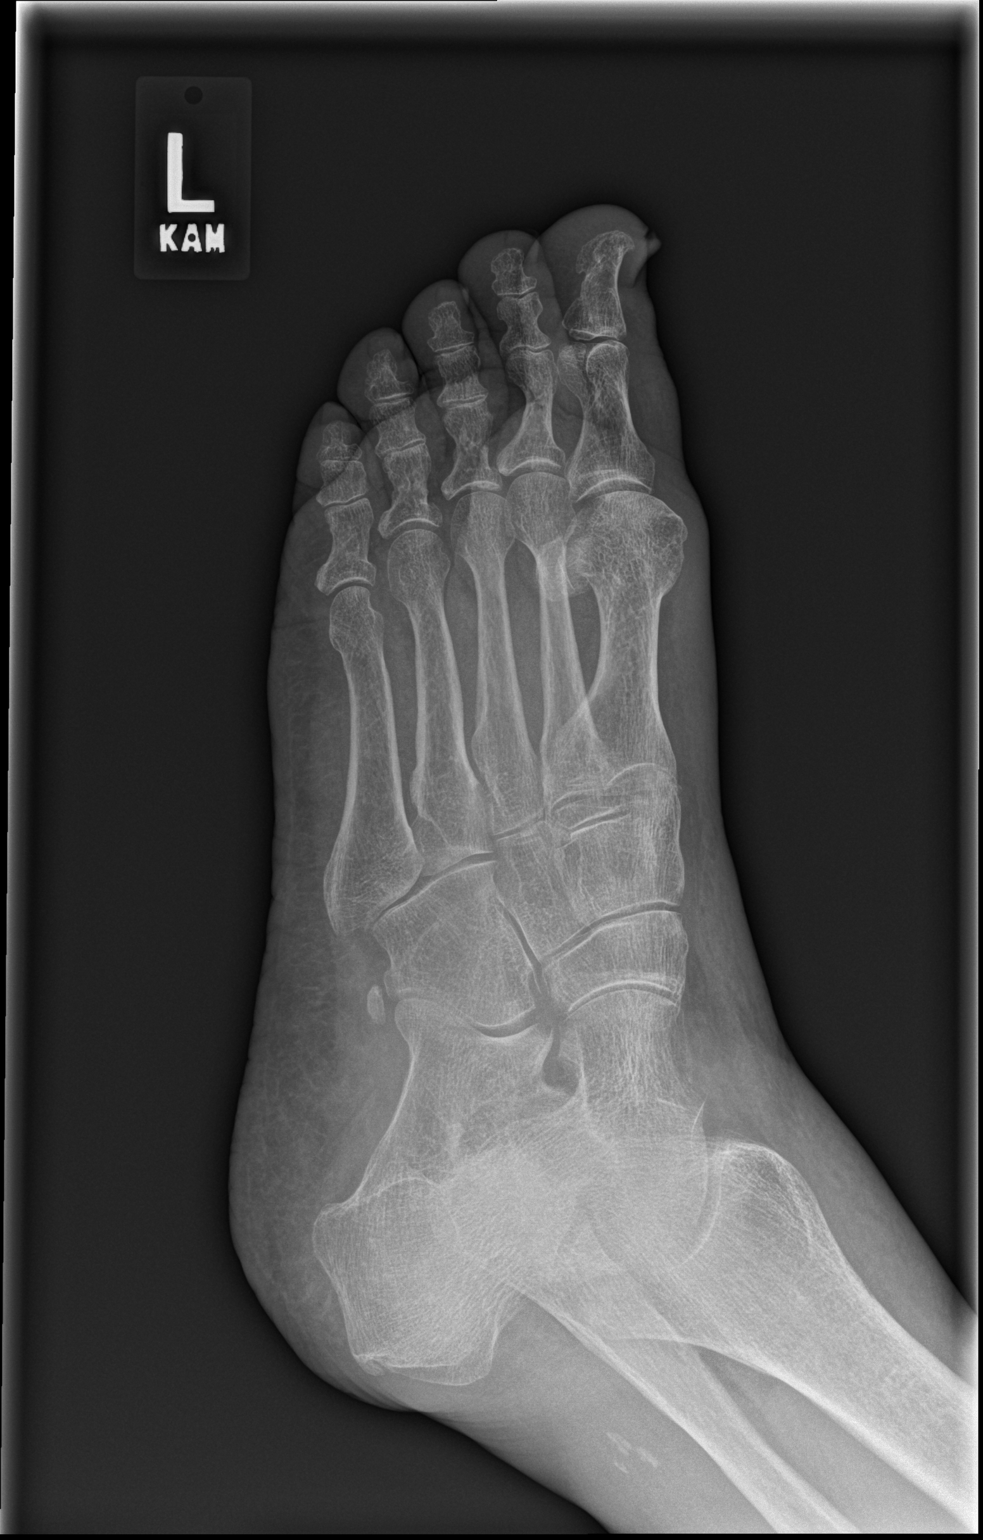

[x foot lat left]
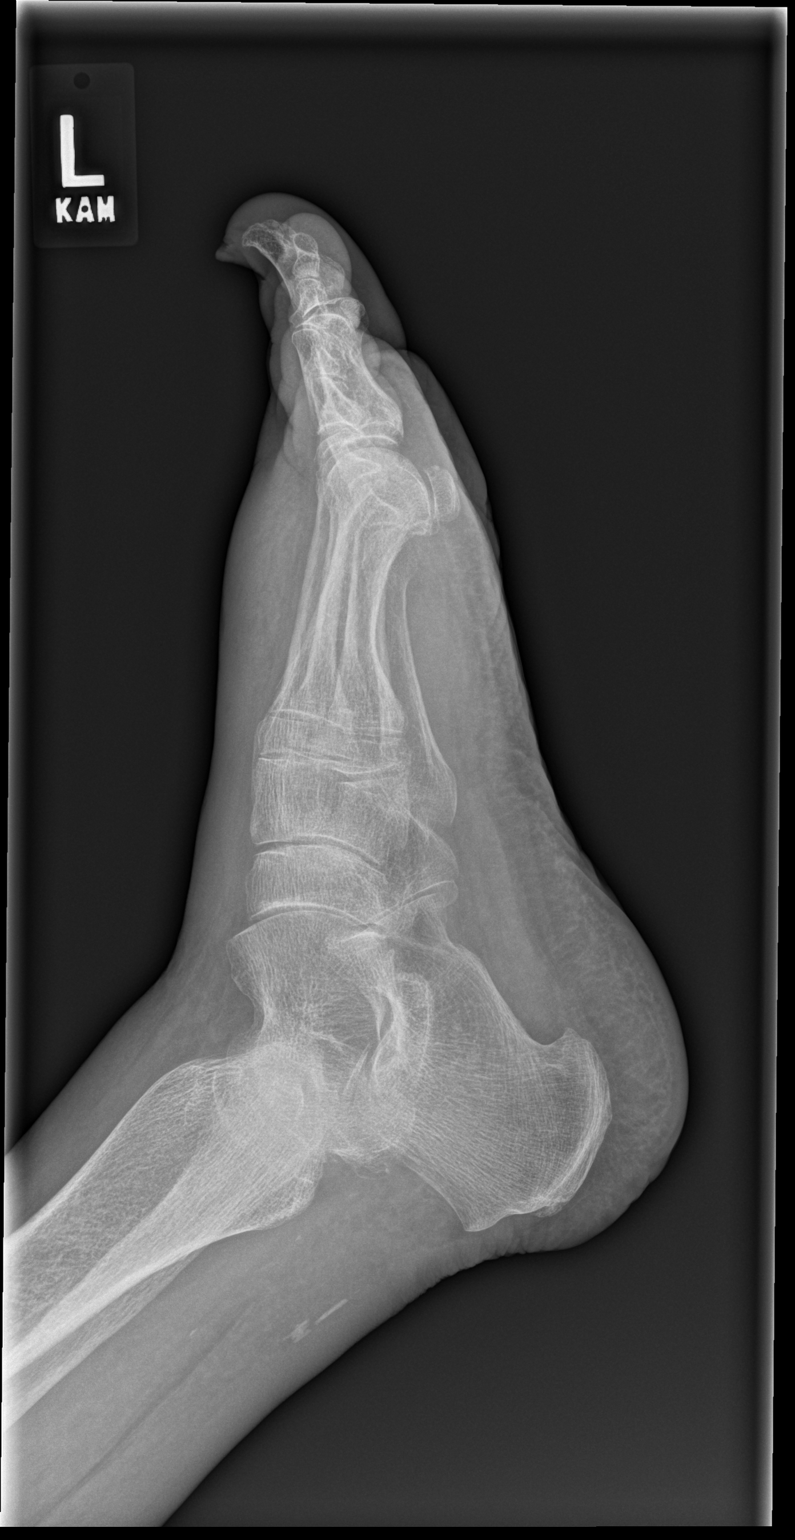

[3 of 3 positions shown; findings below may reference images not displayed]

FINDINGS: No fracture or malalignment. Dorsal soft tissue swelling. Mild
degenerative change at the first MTP joint.
IMPRESSION: No acute osseous abnormality.

## 2022-08-16 ENCOUNTER — Telehealth: Payer: Self-pay | Admitting: Family Medicine

## 2022-08-16 NOTE — Telephone Encounter (Signed)
Called patient to schedule Medicare Annual Wellness Visit (AWV). No voicemail available to leave a message.  Last date of AWV:  AWVI eligible as of 04/10/2012   Please schedule an appointment at any time with [place] [place].  If any questions, please contact me at [phone]   Thank you,  [place] Direct dial  [phone]

## 2022-08-21 DIAGNOSIS — S86012A Strain of left Achilles tendon, initial encounter: Secondary | ICD-10-CM | POA: Diagnosis not present

## 2022-08-27 ENCOUNTER — Ambulatory Visit: Payer: PPO | Admitting: Podiatry

## 2022-08-27 ENCOUNTER — Encounter: Payer: Self-pay | Admitting: Podiatry

## 2022-08-27 DIAGNOSIS — M79674 Pain in right toe(s): Secondary | ICD-10-CM | POA: Diagnosis not present

## 2022-08-27 DIAGNOSIS — B351 Tinea unguium: Secondary | ICD-10-CM | POA: Diagnosis not present

## 2022-08-27 DIAGNOSIS — M79675 Pain in left toe(s): Secondary | ICD-10-CM | POA: Diagnosis not present

## 2022-08-27 NOTE — Progress Notes (Unsigned)
  Subjective:  Patient ID: Leslie Dean, female    DOB: [birth year],  MRN: [id number]  Leslie Dean presents to clinic today for painful thick toenails that are difficult to trim. Aggravating factors include wearing enclosed shoe gear. Pain is relieved with periodic professional debridement. She is accompanied by her daughter on today's visit. Chief Complaint  Patient presents with   Nail Problem    RFC PCP-Liland PCP VST-6 months ago   New problem(s): None.   PCP is Lilland, Alana, DO.  Allergies  Allergen Reactions   Albuterol Sulfate    Hydromorphone Hcl    Other     Says narcotics cause nausea for her    Review of Systems: Negative except as noted in the HPI.  Objective: No changes noted in today's physical examination. There were no vitals filed for this visit.  Leslie APPLE BENEDICTO is a pleasant 77 y.o. female thin build in NAD. AAO x 3. Vascular Examination: Capillary refill time immediate b/l. Vascular status intact b/l with palpable pedal pulses. Pedal hair present b/l. No edema. No pain with calf compression b/l. Skin temperature gradient WNL b/l.   Neurological Examination: Protective sensation diminished with 10g monofilament b/l.  Dermatological Examination: Pedal skin with normal turgor, texture and tone b/l. Toenails 1-5 b/l thick, discolored, elongated with subungual debris and pain on dorsal palpation. No open wounds b/l LE. No interdigital macerations noted b/l LE.  Musculoskeletal Examination: Normal muscle strength 5/5 to all lower extremity muscle groups bilaterally. No pain, crepitus or joint limitation noted with ROM b/l LE. No gross bony pedal deformities b/l. Patient is wheelchair bound. Radiographs: None  Assessment/Plan: 1. Pain due to onychomycosis of toenails of both feet    -Consent given for treatment as described below: -Examined patient. -Patient to continue soft, supportive shoe gear daily. -Mycotic toenails 1-5 bilaterally were  debrided in length and girth with sterile nail nippers and dremel without incident. -Patient/POA to call should there be question/concern in the interim.   Return in about 3 months (around 11/27/2022).  Marzetta Board, DPM

## 2022-08-28 DIAGNOSIS — R262 Difficulty in walking, not elsewhere classified: Secondary | ICD-10-CM | POA: Diagnosis not present

## 2022-08-28 DIAGNOSIS — S86012D Strain of left Achilles tendon, subsequent encounter: Secondary | ICD-10-CM | POA: Diagnosis not present

## 2022-08-28 DIAGNOSIS — M6281 Muscle weakness (generalized): Secondary | ICD-10-CM | POA: Diagnosis not present

## 2022-09-09 DIAGNOSIS — R262 Difficulty in walking, not elsewhere classified: Secondary | ICD-10-CM | POA: Diagnosis not present

## 2022-09-09 DIAGNOSIS — M6281 Muscle weakness (generalized): Secondary | ICD-10-CM | POA: Diagnosis not present

## 2022-09-09 DIAGNOSIS — S86012D Strain of left Achilles tendon, subsequent encounter: Secondary | ICD-10-CM | POA: Diagnosis not present

## 2022-09-13 DIAGNOSIS — R262 Difficulty in walking, not elsewhere classified: Secondary | ICD-10-CM | POA: Diagnosis not present

## 2022-09-13 DIAGNOSIS — S86012D Strain of left Achilles tendon, subsequent encounter: Secondary | ICD-10-CM | POA: Diagnosis not present

## 2022-09-13 DIAGNOSIS — M6281 Muscle weakness (generalized): Secondary | ICD-10-CM | POA: Diagnosis not present

## 2022-09-18 DIAGNOSIS — R262 Difficulty in walking, not elsewhere classified: Secondary | ICD-10-CM | POA: Diagnosis not present

## 2022-09-18 DIAGNOSIS — M6281 Muscle weakness (generalized): Secondary | ICD-10-CM | POA: Diagnosis not present

## 2022-09-18 DIAGNOSIS — S86012D Strain of left Achilles tendon, subsequent encounter: Secondary | ICD-10-CM | POA: Diagnosis not present

## 2022-09-23 DIAGNOSIS — M6281 Muscle weakness (generalized): Secondary | ICD-10-CM | POA: Diagnosis not present

## 2022-09-23 DIAGNOSIS — S86012D Strain of left Achilles tendon, subsequent encounter: Secondary | ICD-10-CM | POA: Diagnosis not present

## 2022-09-23 DIAGNOSIS — R262 Difficulty in walking, not elsewhere classified: Secondary | ICD-10-CM | POA: Diagnosis not present

## 2022-09-25 DIAGNOSIS — R262 Difficulty in walking, not elsewhere classified: Secondary | ICD-10-CM | POA: Diagnosis not present

## 2022-09-25 DIAGNOSIS — S86012D Strain of left Achilles tendon, subsequent encounter: Secondary | ICD-10-CM | POA: Diagnosis not present

## 2022-09-25 DIAGNOSIS — M6281 Muscle weakness (generalized): Secondary | ICD-10-CM | POA: Diagnosis not present

## 2022-11-05 ENCOUNTER — Telehealth: Payer: Self-pay

## 2022-11-05 NOTE — Telephone Encounter (Signed)
Patient calls nurse line requesting an antibiotic for a UTI.   She reports symptoms have been present for 1 week. She reports urinary frequency, belly pain and dysuria.   She denies any fevers, chills, hematuria or back pain.   Patient advise she would need an apt, however she states she has a torn ACL and can not come in right now. She reports "she has called me in an antibiotic before."   Will forward to PCP.   Precautions dicussed. Advised to push fluids.

## 2022-11-06 NOTE — Telephone Encounter (Signed)
Attempted to call patient, however was not able to connect.   "We are sorry we can not complete your call at this time."   Will await a call back.

## 2022-11-11 ENCOUNTER — Emergency Department (HOSPITAL_COMMUNITY): Payer: PPO

## 2022-11-11 ENCOUNTER — Encounter (HOSPITAL_COMMUNITY): Payer: Self-pay

## 2022-11-11 ENCOUNTER — Inpatient Hospital Stay (HOSPITAL_COMMUNITY)
Admission: EM | Admit: 2022-11-11 | Discharge: 2022-11-13 | DRG: 291 | Disposition: A | Discharge status: Home / Self Care | Payer: PPO | Attending: Family Medicine | Admitting: Family Medicine

## 2022-11-11 ENCOUNTER — Ambulatory Visit: Payer: PPO

## 2022-11-11 ENCOUNTER — Other Ambulatory Visit: Payer: Self-pay

## 2022-11-11 ENCOUNTER — Emergency Department (HOSPITAL_BASED_OUTPATIENT_CLINIC_OR_DEPARTMENT_OTHER): Payer: PPO

## 2022-11-11 DIAGNOSIS — E041 Nontoxic single thyroid nodule: Secondary | ICD-10-CM | POA: Diagnosis present

## 2022-11-11 DIAGNOSIS — Z79899 Other long term (current) drug therapy: Secondary | ICD-10-CM

## 2022-11-11 DIAGNOSIS — R197 Diarrhea, unspecified: Secondary | ICD-10-CM | POA: Diagnosis present

## 2022-11-11 DIAGNOSIS — I509 Heart failure, unspecified: Secondary | ICD-10-CM | POA: Diagnosis not present

## 2022-11-11 DIAGNOSIS — I4510 Unspecified right bundle-branch block: Secondary | ICD-10-CM | POA: Diagnosis present

## 2022-11-11 DIAGNOSIS — G8929 Other chronic pain: Secondary | ICD-10-CM | POA: Diagnosis present

## 2022-11-11 DIAGNOSIS — R609 Edema, unspecified: Secondary | ICD-10-CM

## 2022-11-11 DIAGNOSIS — M549 Dorsalgia, unspecified: Secondary | ICD-10-CM | POA: Diagnosis present

## 2022-11-11 DIAGNOSIS — Z87891 Personal history of nicotine dependence: Secondary | ICD-10-CM

## 2022-11-11 DIAGNOSIS — E785 Hyperlipidemia, unspecified: Secondary | ICD-10-CM | POA: Diagnosis present

## 2022-11-11 DIAGNOSIS — I2721 Secondary pulmonary arterial hypertension: Secondary | ICD-10-CM | POA: Diagnosis present

## 2022-11-11 DIAGNOSIS — J9811 Atelectasis: Secondary | ICD-10-CM | POA: Diagnosis not present

## 2022-11-11 DIAGNOSIS — Z8279 Family history of other congenital malformations, deformations and chromosomal abnormalities: Secondary | ICD-10-CM

## 2022-11-11 DIAGNOSIS — R0600 Dyspnea, unspecified: Secondary | ICD-10-CM | POA: Diagnosis not present

## 2022-11-11 DIAGNOSIS — M810 Age-related osteoporosis without current pathological fracture: Secondary | ICD-10-CM | POA: Diagnosis present

## 2022-11-11 DIAGNOSIS — I110 Hypertensive heart disease with heart failure: Principal | ICD-10-CM | POA: Diagnosis present

## 2022-11-11 DIAGNOSIS — Z9049 Acquired absence of other specified parts of digestive tract: Secondary | ICD-10-CM

## 2022-11-11 DIAGNOSIS — Z1152 Encounter for screening for COVID-19: Secondary | ICD-10-CM

## 2022-11-11 DIAGNOSIS — I3139 Other pericardial effusion (noninflammatory): Secondary | ICD-10-CM | POA: Diagnosis present

## 2022-11-11 DIAGNOSIS — Q8740 Marfan's syndrome, unspecified: Secondary | ICD-10-CM

## 2022-11-11 DIAGNOSIS — M419 Scoliosis, unspecified: Secondary | ICD-10-CM | POA: Diagnosis present

## 2022-11-11 DIAGNOSIS — J811 Chronic pulmonary edema: Secondary | ICD-10-CM | POA: Diagnosis not present

## 2022-11-11 DIAGNOSIS — Z888 Allergy status to other drugs, medicaments and biological substances status: Secondary | ICD-10-CM

## 2022-11-11 DIAGNOSIS — S86012D Strain of left Achilles tendon, subsequent encounter: Secondary | ICD-10-CM

## 2022-11-11 DIAGNOSIS — Z7982 Long term (current) use of aspirin: Secondary | ICD-10-CM

## 2022-11-11 DIAGNOSIS — I10 Essential (primary) hypertension: Secondary | ICD-10-CM | POA: Diagnosis present

## 2022-11-11 DIAGNOSIS — Z87440 Personal history of urinary (tract) infections: Secondary | ICD-10-CM

## 2022-11-11 DIAGNOSIS — X58XXXD Exposure to other specified factors, subsequent encounter: Secondary | ICD-10-CM | POA: Diagnosis present

## 2022-11-11 DIAGNOSIS — S83512A Sprain of anterior cruciate ligament of left knee, initial encounter: Secondary | ICD-10-CM | POA: Insufficient documentation

## 2022-11-11 DIAGNOSIS — Z8673 Personal history of transient ischemic attack (TIA), and cerebral infarction without residual deficits: Secondary | ICD-10-CM

## 2022-11-11 DIAGNOSIS — I5031 Acute diastolic (congestive) heart failure: Secondary | ICD-10-CM | POA: Diagnosis present

## 2022-11-11 LAB — I-STAT VENOUS BLOOD GAS, ED
Acid-Base Excess: 7 mmol/L — ABNORMAL HIGH (ref 0.0–2.0)
Bicarbonate: 33 mmol/L — ABNORMAL HIGH (ref 20.0–28.0)
Calcium, Ion: 1.25 mmol/L (ref 1.15–1.40)
HCT: 39 % (ref 36.0–46.0)
Hemoglobin: 13.3 g/dL (ref 12.0–15.0)
O2 Saturation: 95 %
Potassium: 4.6 mmol/L (ref 3.5–5.1)
Sodium: 141 mmol/L (ref 135–145)
TCO2: 35 mmol/L — ABNORMAL HIGH (ref 22–32)
pCO2, Ven: 53.4 mmHg (ref 44–60)
pH, Ven: 7.4 (ref 7.25–7.43)
pO2, Ven: 78 mmHg — ABNORMAL HIGH (ref 32–45)

## 2022-11-11 LAB — CBC
HCT: 43.3 % (ref 36.0–46.0)
Hemoglobin: 12.9 g/dL (ref 12.0–15.0)
MCH: 27.4 pg (ref 26.0–34.0)
MCHC: 29.8 g/dL — ABNORMAL LOW (ref 30.0–36.0)
MCV: 92.1 fL (ref 80.0–100.0)
Platelets: 215 10*3/uL (ref 150–400)
RBC: 4.7 MIL/uL (ref 3.87–5.11)
RDW: 15.2 % (ref 11.5–15.5)
WBC: 8.9 10*3/uL (ref 4.0–10.5)
nRBC: 0 % (ref 0.0–0.2)

## 2022-11-11 LAB — COMPREHENSIVE METABOLIC PANEL
ALT: 14 U/L (ref 0–44)
AST: 15 U/L (ref 15–41)
Albumin: 3.3 g/dL — ABNORMAL LOW (ref 3.5–5.0)
Alkaline Phosphatase: 50 U/L (ref 38–126)
Anion gap: 10 (ref 5–15)
BUN: 11 mg/dL (ref 8–23)
CO2: 29 mmol/L (ref 22–32)
Calcium: 10 mg/dL (ref 8.9–10.3)
Chloride: 102 mmol/L (ref 98–111)
Creatinine, Ser: 0.94 mg/dL (ref 0.44–1.00)
GFR, Estimated: >60 mL/min (ref >60)
Glucose, Bld: 107 mg/dL — ABNORMAL HIGH (ref 70–99)
Potassium: 4.2 mmol/L (ref 3.5–5.1)
Sodium: 141 mmol/L (ref 135–145)
Total Bilirubin: 1.2 mg/dL (ref 0.3–1.2)
Total Protein: 6.3 g/dL — ABNORMAL LOW (ref 6.5–8.1)

## 2022-11-11 LAB — D-DIMER, QUANTITATIVE: D-Dimer, Quant: 0.8 ug/mL-FEU — ABNORMAL HIGH (ref 0.00–0.50)

## 2022-11-11 LAB — TROPONIN I (HIGH SENSITIVITY)
Troponin I (High Sensitivity): 10 ng/L (ref <18)
Troponin I (High Sensitivity): 9 ng/L (ref <18)

## 2022-11-11 LAB — BRAIN NATRIURETIC PEPTIDE: B Natriuretic Peptide: 855.3 pg/mL — ABNORMAL HIGH (ref 0.0–100.0)

## 2022-11-11 LAB — SARS CORONAVIRUS 2 BY RT PCR: SARS Coronavirus 2 by RT PCR: NEGATIVE

## 2022-11-11 LAB — LACTIC ACID, PLASMA: Lactic Acid, Venous: 1.1 mmol/L (ref 0.5–1.9)

## 2022-11-11 MED ORDER — METOPROLOL SUCCINATE ER 50 MG PO TB24
50.0000 mg | ORAL_TABLET | Freq: Every day | ORAL | Status: DC
  Administered 2022-11-13: 50 mg via ORAL
  Filled 2022-11-11: qty 1

## 2022-11-11 MED ORDER — PREGABALIN 25 MG PO CAPS
75.0000 mg | ORAL_CAPSULE | Freq: Every day | ORAL | Status: DC
  Administered 2022-11-12 (×2): 75 mg via ORAL
  Filled 2022-11-11 (×2): qty 3

## 2022-11-11 MED ORDER — ASPIRIN 81 MG PO CHEW
81.0000 mg | CHEWABLE_TABLET | Freq: Every day | ORAL | Status: DC
  Administered 2022-11-12 – 2022-11-13 (×2): 81 mg via ORAL
  Filled 2022-11-11 (×2): qty 1

## 2022-11-11 MED ORDER — ATORVASTATIN CALCIUM 40 MG PO TABS
40.0000 mg | ORAL_TABLET | Freq: Every day | ORAL | Status: DC
  Administered 2022-11-12 – 2022-11-13 (×2): 40 mg via ORAL
  Filled 2022-11-11 (×2): qty 1

## 2022-11-11 MED ORDER — FERROUS SULFATE 325 (65 FE) MG PO TABS
325.0000 mg | ORAL_TABLET | Freq: Every day | ORAL | Status: DC
  Administered 2022-11-12 – 2022-11-13 (×2): 325 mg via ORAL
  Filled 2022-11-11 (×2): qty 1

## 2022-11-11 MED ORDER — FUROSEMIDE 10 MG/ML IJ SOLN
40.0000 mg | Freq: Once | INTRAMUSCULAR | Status: AC
  Administered 2022-11-11: 40 mg via INTRAVENOUS
  Filled 2022-11-11: qty 4

## 2022-11-11 MED ORDER — LOSARTAN POTASSIUM 50 MG PO TABS
50.0000 mg | ORAL_TABLET | Freq: Every day | ORAL | Status: DC
  Administered 2022-11-13: 50 mg via ORAL
  Filled 2022-11-11 (×2): qty 1

## 2022-11-11 MED ORDER — VITAMIN D 25 MCG (1000 UNIT) PO TABS
2000.0000 [IU] | ORAL_TABLET | Freq: Every day | ORAL | Status: DC
  Administered 2022-11-12 – 2022-11-13 (×2): 2000 [IU] via ORAL
  Filled 2022-11-11 (×2): qty 2

## 2022-11-11 MED ORDER — IOHEXOL 350 MG/ML SOLN
75.0000 mL | Freq: Once | INTRAVENOUS | Status: AC | PRN
  Administered 2022-11-11: 75 mL via INTRAVENOUS

## 2022-11-11 MED ORDER — ENOXAPARIN SODIUM 40 MG/0.4ML IJ SOSY
40.0000 mg | PREFILLED_SYRINGE | INTRAMUSCULAR | Status: DC
  Administered 2022-11-11 – 2022-11-12 (×2): 40 mg via SUBCUTANEOUS
  Filled 2022-11-11 (×2): qty 0.4

## 2022-11-11 NOTE — Assessment & Plan Note (Addendum)
VSS.  Troponins flat.  BNP 855.  Coarse crackles bilateral lung bases with pitting edema to bilateral lower extremities.  Evidence of fluid overload on chest x-ray CTA PE, though weight stable from prior in 03/2022. On 20 mg Lasix at home for lower extremity swelling.  Previous echo in 2021 with LVEF 55 to 60%, mild LVH, G1DD, LA mild dilation, trivial MVR, mild AVR. Given progressively worsening SOB and symptoms of new onset CHF, will admit for diuresis and further workup to [facility] attending Dr. Miquel Dunn. -S/p Lasix IV 40 mg once with improvement in LE edema, assess volume status in a.m. and redose accordingly -Obtain TTE for further characterization of heart function, consider cardiology and pulmonology involvement based on findings -Consider addition of Entresto, spironolactone, and SGLT2 depending on echo -AM BMP (keep K above 4) and mag (keep above 2) -Continuous cardiac monitoring -O2 as needed for sats above 90% -Strict I/O's -Daily weights

## 2022-11-11 NOTE — ED Notes (Signed)
Pt refusing to have lasix at this time. Pt stated she would like to go to her CT scan before medication administration because she is worried she will urinate on herself. Pt educated, care ongoing.

## 2022-11-11 NOTE — ED Provider Notes (Signed)
  Physical Exam  BP (!) 141/73   Pulse 69   Temp 98.1 F (36.7 C) (Oral)   Resp 10   Ht 5\' 6"  (1.676 m)   Wt 90.7 kg   SpO2 94%   BMI 32.28 kg/m   Physical Exam Vitals and nursing note reviewed.  Constitutional:      Appearance: Normal appearance.  HENT:     Head: Normocephalic and atraumatic.     Mouth/Throat:     Mouth: Mucous membranes are moist.  Eyes:     Conjunctiva/sclera: Conjunctivae normal.  Cardiovascular:     Rate and Rhythm: Normal rate.  Pulmonary:     Effort: Pulmonary effort is normal. Tachypnea present. No respiratory distress.     Breath sounds: Examination of the right-lower field reveals rales. Examination of the left-lower field reveals rales. Rales present.  Abdominal:     General: Abdomen is flat.  Musculoskeletal:        General: No deformity.     Right lower leg: Edema present.     Left lower leg: Edema present.  Skin:    General: Skin is warm and dry.     Capillary Refill: Capillary refill takes less than 2 seconds.  Neurological:     General: No focal deficit present.     Mental Status: She is alert. Mental status is at baseline.  Psychiatric:        Mood and Affect: Mood normal.        Behavior: Behavior normal.     Procedures  Procedures  ED Course / MDM   Clinical Course as of 11/11/22 2105  Mon Nov 11, 2022  1658 VBG reviewed and interpreted and normal pH with normal pCO2 and appears to be oxygen Well  [DR]  1659 Troponin and repeat troponin normal [DR]  1659 COVID negative [DR]  1659 BC reviewed and interpreted within normal limits [DR]  1659 Complete metabolic panel is reviewed and interpreted does not show any evidence of acute electrolyte abnormality [DR]  1659 D-dimer is  elevated at 0.8 [DR]  1715 Received sign out pending CTA chest. Has elevated d-dimer. Came with signs of CHF [WS]  2104 Reassessed patient, is that he still has mild shortness of breath.  Reviewed chart, no prior history of CHF.  CT angiography of the  chest without evidence of pulmonary embolism but does show signs of pulmonary hypertension and some pulmonary edema.  Given age, new onset, believe patient should be admitted for further workup.  Patient's family also prefer that she stay in the hospital for further workup given how weak she has been recently.  Patient is agreeable.  Discussed with the family medicine service who will admit the patient. [WS]    Clinical Course User Index [DR] Margarita Grizzle, MD [WS] Lonell Grandchild, MD   Medical Decision Making Amount and/or Complexity of Data Reviewed Labs: ordered. Radiology: ordered.  Risk Prescription drug management. Decision regarding hospitalization.     ICD-10-CM   1. Acute congestive heart failure, unspecified heart failure type (HCC)  I50.9             Lonell Grandchild, MD 11/11/22 2106

## 2022-11-11 NOTE — ED Notes (Signed)
Pt CT scan not completed as of yet, pt refused to have lasix administered until CT scan is complete. Care ongoing.

## 2022-11-11 NOTE — Assessment & Plan Note (Signed)
Unknown occurrence, though at least over 1 month ago.  By report, elected to not have surgery, but did follow-up for this.  Likely contributing to some element of deconditioning that could be contributing to SOB. -PT and OT consult -Fall precautions

## 2022-11-11 NOTE — ED Triage Notes (Signed)
Pt to ED via POV with c/o SOB that started one week ago and has gotten worse today. Pt stated she has mid sternal chest pain and nausea. Pt states she take lasix and has been urinating frequently. Pt Aox4 in triage, Lower extremity swelling noted +2 pitting edema.

## 2022-11-11 NOTE — ED Notes (Signed)
Pt transported to vascular.  °

## 2022-11-11 NOTE — Assessment & Plan Note (Signed)
BP intermittently mildly elevated. Watch BP with IV Lasix administration. -Continue home losartan and metoprolol

## 2022-11-11 NOTE — Progress Notes (Signed)
VASCULAR LAB   Bilateral lower extremity venous duplex has been performed.  See CV proc for preliminary results.   Vila Dory, RVT 11/11/2022, 3:53 PM

## 2022-11-11 NOTE — ED Provider Notes (Signed)
[place] EMERGENCY DEPARTMENT AT [facility] Provider Note   CSN: [id number] Arrival date & time: 11/11/22  4098     History {Add pertinent medical, surgical, social history, OB history to HPI:1} Chief Complaint  Patient presents with  . Shortness of Breath    Leslie Dean is a 77 y.o. female.  HPI 77 yo female presents complaining of nausea.  She reports cough with fever last week.  She reports ongoing cough which is nonproductive in nature.  Feels like she coughs but she cannot quite get it up.  Feels somewhat more short of breath.  She states she feels like she needs to vomit but she has not.  She has some increased peripheral edema which she and her daughter report has worse from baseline.  Left leg is swollen more than right leg.  She is on Lasix.    Home Medications Prior to Admission medications   Medication Sig Start Date End Date Taking? Authorizing Provider  aspirin 81 MG chewable tablet Chew by mouth.    [provider]  atorvastatin (LIPITOR) 40 MG tablet TAKE 1 TABLET(40 MG) BY MOUTH DAILY 07/25/22   Lilland, Alana, DO  Calcium Carb-Cholecalciferol (CALCIUM-VITAMIN D) 600-400 MG-UNIT TABS Take 1 tablet by mouth daily.    [provider]  Cholecalciferol (VITAMIN D3) 50 MCG (2000 UT) capsule Take 2,000 Units by mouth daily.    [provider]  ferrous sulfate 325 (65 FE) MG tablet Take 325 mg by mouth daily with breakfast.    [provider]  FORTEO 600 MCG/2.4ML SOPN SMARTSIG:20 MCG SUB-Q Every Evening Patient not taking: Reported on 03/11/2022 10/24/21   [provider]  furosemide (LASIX) 20 MG tablet Take 20 mg by mouth daily. Patient not taking: Reported on 03/11/2022    [provider]  losartan (COZAAR) 50 MG tablet Take 50 mg by mouth daily.     [provider]  METOPROLOL SUCCINATE ER PO Take 1 tablet by mouth daily.    [provider]  pregabalin (LYRICA) 75 MG capsule  02/20/22    [provider]  PROLIA 60 MG/ML SOSY injection Inject into the skin. 11/02/21   [provider]      Allergies    Albuterol sulfate, Hydromorphone hcl, and Other    Review of Systems   Review of Systems  Physical Exam Updated Vital Signs BP 114/64   Pulse 62   Temp 97.6 F (36.4 C) (Oral)   Resp (!) 23   Ht 1.676 m (5\' 6" )   Wt 90.7 kg   SpO2 98%   BMI 32.28 kg/m  Physical Exam Vitals reviewed.  Constitutional:      Appearance: She is obese.  HENT:     Head: Normocephalic.     Mouth/Throat:     Mouth: Mucous membranes are moist.  Eyes:     Pupils: Pupils are equal, round, and reactive to light.  Cardiovascular:     Rate and Rhythm: Normal rate and regular rhythm.  Pulmonary:     Breath sounds: Decreased breath sounds present.  Abdominal:     Palpations: Abdomen is soft.  Musculoskeletal:     Cervical back: Normal range of motion.     Right lower leg: Edema present.     Left lower leg: Edema present.     Comments: Edema bilateral feet left leg more than right leg  Skin:    General: Skin is warm.     Capillary Refill: Capillary refill  takes less than 2 seconds.  Neurological:     General: No focal deficit present.     Mental Status: She is alert.  Psychiatric:        Mood and Affect: Mood normal.     ED Results / Procedures / Treatments   Labs (all labs ordered are listed, but only abnormal results are displayed) Labs Reviewed  CBC - Abnormal; Notable for the following components:      Result Value   MCHC 29.8 (*)    All other components within normal limits  COMPREHENSIVE METABOLIC PANEL - Abnormal; Notable for the following components:   Glucose, Bld 107 (*)    Total Protein 6.3 (*)    Albumin 3.3 (*)    All other components within normal limits  BRAIN NATRIURETIC PEPTIDE - Abnormal; Notable for the following components:   B Natriuretic Peptide 855.3 (*)    All other components within normal limits  D-DIMER, QUANTITATIVE -  Abnormal; Notable for the following components:   D-Dimer, Quant 0.80 (*)    All other components within normal limits  I-STAT VENOUS BLOOD GAS, ED - Abnormal; Notable for the following components:   pO2, Ven 78 (*)    Bicarbonate 33.0 (*)    TCO2 35 (*)    Acid-Base Excess 7.0 (*)    All other components within normal limits  SARS CORONAVIRUS 2 BY RT PCR  CULTURE, BLOOD (ROUTINE X 2)  CULTURE, BLOOD (ROUTINE X 2)  LACTIC ACID, PLASMA  BLOOD GAS, VENOUS  TROPONIN I (HIGH SENSITIVITY)  TROPONIN I (HIGH SENSITIVITY)    EKG EKG Interpretation  Date/Time:    Ventricular Rate:  61 PR Interval:  168 QRS Duration: 138 QT Interval:  451 QTC Calculation: 455 R Axis:   96 Text Interpretation: Sinus rhythm Left atrial enlargement RBBB and LPFB Nonspecific T abnormalities, lateral leads Confirmed by Margarita Grizzle [phone]) on 11/11/2022 5:12:41 PM  Radiology VAS [place] LOWER EXTREMITY VENOUS (DVT)  Result Date: 11/11/2022  Lower Venous DVT Study Patient Name:  Leslie Dean  Date of Exam:   11/11/2022 Medical Rec #: [id number]        Accession #:    [id number] Date of Birth: 08/17/1945       Patient Gender: F Patient Age:   71 years Exam Location:  [facility] Procedure:      VAS [place] LOWER EXTREMITY VENOUS (DVT) Referring Phys: Duwayne Heck Jamiyla Ishee --------------------------------------------------------------------------------  Indications: Swelling.  Limitations: Patient could not tolerate compressions. Comparison Study: No prior study on file Performing Technologist: Sherren Kerns RVS  Examination Guidelines: A complete evaluation includes B-mode imaging, spectral Doppler, color Doppler, and power Doppler as needed of all accessible portions of each vessel. Bilateral testing is considered an integral part of a complete examination. Limited examinations for reoccurring indications may be performed as noted. The reflux portion of the exam is performed with the patient in reverse Trendelenburg.   +---------+---------------+---------+-----------+----------+-------------------+ RIGHT    CompressibilityPhasicitySpontaneityPropertiesThrombus Aging      +---------+---------------+---------+-----------+----------+-------------------+ CFV      Full           Yes      Yes                                      +---------+---------------+---------+-----------+----------+-------------------+ SFJ      Full                                                             +---------+---------------+---------+-----------+----------+-------------------+  FV Prox                 Yes      Yes                  patent by color and                                                       Doppler             +---------+---------------+---------+-----------+----------+-------------------+ FV Mid                  Yes      Yes                  patent by color and                                                       Doppler             +---------+---------------+---------+-----------+----------+-------------------+ FV Distal               Yes      Yes                  patent by color and                                                       Doppler             +---------+---------------+---------+-----------+----------+-------------------+ PFV                     Yes      Yes                  patent by color and                                                       Doppler             +---------+---------------+---------+-----------+----------+-------------------+ POP      Full           Yes      Yes                                      +---------+---------------+---------+-----------+----------+-------------------+ PTV      Full                                                             +---------+---------------+---------+-----------+----------+-------------------+ PERO     Full                                                              +---------+---------------+---------+-----------+----------+-------------------+   +---------+---------------+---------+-----------+----------+--------------+  LEFT     CompressibilityPhasicitySpontaneityPropertiesThrombus Aging +---------+---------------+---------+-----------+----------+--------------+ CFV      Full           Yes      Yes                                 +---------+---------------+---------+-----------+----------+--------------+ SFJ      Full                                                        +---------+---------------+---------+-----------+----------+--------------+ FV Prox  Full                                                        +---------+---------------+---------+-----------+----------+--------------+ FV Mid   Full           Yes      Yes                                 +---------+---------------+---------+-----------+----------+--------------+ FV DistalFull                                                        +---------+---------------+---------+-----------+----------+--------------+ PFV      Full                                                        +---------+---------------+---------+-----------+----------+--------------+ POP      Full           Yes      Yes                                 +---------+---------------+---------+-----------+----------+--------------+ PTV      Full                                                        +---------+---------------+---------+-----------+----------+--------------+ PERO     Full                                                        +---------+---------------+---------+-----------+----------+--------------+     Summary: BILATERAL: - No evidence of deep vein thrombosis seen in the lower extremities, bilaterally. -No evidence of popliteal cyst, bilaterally.   *See table(s) above for measurements and observations. Electronically signed by Lemar Livings MD on 11/11/2022 at  4:39:18 PM.    Final    DG  Chest Port 1 View  Result Date: 11/11/2022 CLINICAL DATA:  Dyspnea. EXAM: PORTABLE CHEST 1 VIEW COMPARISON:  10/21/2019 FINDINGS: Improved inspiration. Operative progressive enlargement of the cardiac silhouette with mildly increased pulmonary vascular congestion and interval mild diffuse interstitial pulmonary edema. No pleural fluid. Diffuse osteopenia. Atheromatous aortic arch calcifications. IMPRESSION: Mild acute congestive heart failure with mildly progressive cardiomegaly, pulmonary vascular congestion and mild interstitial pulmonary edema. Electronically Signed   By: Beckie Salts M.D.   On: 11/11/2022 11:20    Procedures Procedures  {Document cardiac monitor, telemetry assessment procedure when appropriate:1}  Medications Ordered in ED Medications  furosemide (LASIX) injection 40 mg (has no administration in time range)    ED Course/ Medical Decision Making/ A&P Clinical Course as of 11/11/22 1727  Mon Nov 11, 2022  1658 VBG reviewed and interpreted and normal pH with normal pCO2 and appears to be oxygen Well  [DR]  1659 Troponin and repeat troponin normal [DR]  1659 COVID negative [DR]  1659 BC reviewed and interpreted within normal limits [DR]  1659 Complete metabolic panel is reviewed and interpreted does not show any evidence of acute electrolyte abnormality [DR]  1659 D-dimer is  elevated at 0.8 [DR]  1715 Received sign out pending CTA chest. Has elevated d-dimer. Came with signs of CHF [WS]    Clinical Course User Index [DR] Margarita Grizzle, MD [WS] Suezanne Jacquet Jerilee Field, MD   {   Click here for ABCD2, HEART and other calculatorsREFRESH Note before signing :1}                          Medical Decision Making Amount and/or Complexity of Data Reviewed Labs: ordered. Radiology: ordered.    77 year old female with peripheral edema presents today complaining of cough and dyspnea.  She has some mild tachypnea but has normal oxygen saturations  levels. Differential diagnosis here in the ED includes but is not limited to volume overload, pneumonia and other lung infections, PE. Patient does not have fever, COVID is negative, blood cell count is normal no definite focal infiltrate is noted on chest x-Lowen Barringer Patient had asymmetrical edema left greater than right and Dopplers were obtained which did not show any evidence of DVT Patient has slightly elevated D-dimer at 0.8 Patient has elevated BNP at 855 Patient received IV Lasix CT angio is ordered and pending Patient care discussed with Dr. Cindee Lame accepts patient in signout and will disposition after CT angio I would anticipate that patient could be discharged after her Lasix if she feels somewhat improved She has outpatient follow-up in place {Document critical care time when appropriate:1} {Document review of labs and clinical decision tools ie heart score, Chads2Vasc2 etc:1}  {Document your independent review of radiology images, and any outside records:1} {Document your discussion with family members, caretakers, and with consultants:1} {Document social determinants of health affecting pt's care:1} {Document your decision making why or why not admission, treatments were needed:1} Final Clinical Impression(s) / ED Diagnoses Final diagnoses:  None    Rx / DC Orders ED Discharge Orders     None

## 2022-11-11 NOTE — H&P (Cosign Needed Addendum)
Hospital Admission History and Physical Service Pager: [phone]  Patient name: Leslie Dean Medical record number: [id number] Date of Birth: 04/27/1946 Age: 77 y.o. Gender: female  Primary Care Provider: Evelena Leyden, DO Consultants: None Code Status: Full Preferred Emergency Contact:  Contact Information     Name Relation Home Work Mobile   Scissors Daughter [phone]     Art Buff Daughter [phone]        Chief Complaint: Shortness of breath  Assessment and Plan: MARQUIESHA ASARE is a 77 y.o. female presenting with shortness of breath. Differential for this patient's presentation of this includes acute onset congestive heart failure (most likely given DOE, orthopnea, bilateral lower extremity swelling that is worsening, elevated BNP, CXR and CTA PE findings; consider viral precipitant given symptoms of cough, SOB, nausea, diarrhea), pulmonary arterial hypertension (more likely given CTA PE findings, age, and history of connective tissue disorder like Marfan syndrome), age-related deconditioning (more likely given age, recent ACL tear limiting mobility), pneumonia (less likely given exam, no true fever, and chest x-ray/mild CTA findings), PE (less likely given negative CTA PE, negative DVT US, and good saturations on room air), and ACS (less likely given EKG unchanged from prior, lack of chest pain)  * CHF exacerbation (HCC) VSS.  Troponins flat.  BNP 855.  Coarse crackles bilateral lung bases with pitting edema to bilateral lower extremities.  Evidence of fluid overload on chest x-ray CTA PE, though weight stable from prior in 03/2022. On 20 mg Lasix at home for lower extremity swelling.  Previous echo in 2021 with LVEF 55 to 60%, mild LVH, G1DD, LA mild dilation, trivial MVR, mild AVR. Given progressively worsening SOB and symptoms of new onset CHF, will admit for diuresis and further workup to FMT[facility] attending Dr. Miquel Dunn. -S/p Lasix IV 40 mg once with improvement  in LE edema, assess volume status in a.m. and redose accordingly -Obtain TTE for further characterization of heart function, consider cardiology and pulmonology involvement based on findings -Consider addition of Entresto, spironolactone, and SGLT2 depending on echo -AM BMP (keep K above 4) and mag (keep above 2) -Continuous cardiac monitoring -O2 as needed for sats above 90% -Strict I/O's -Daily weights  Left ACL tear Unknown occurrence, though at least over 1 month ago.  By report, elected to not have surgery, but did follow-up for this.  Likely contributing to some element of deconditioning that could be contributing to SOB. -PT and OT consult -Fall precautions  HTN (hypertension) BP intermittently mildly elevated. Watch BP with IV Lasix administration. -Continue home losartan and metoprolol  Chronic and stable conditions: Osteoporosis (on Prolia), HLD (continue atorvastatin), spinal stenosis (continue Lyrica), history of TIA (continue ASA)  FEN/GI: Heart healthy/carb modified diet VTE Prophylaxis: Lovenox  Disposition: Med/tele  History of Present Illness:  Leslie Dean is a 77 y.o. female presenting with shortness of breath.  Has also been feeling harder to breathe than usual, most when moving around. Sometimes happens when sitting.  Also has shortness of breath when lying flat, though this has been going on for 1 to 2 months. No chest pain associated with this. She also endorses some hoarseness and a nonproductive cough that started 1-2 weeks ago, as well. Has been having leg swelling that has been worsening over past month.   Does sometimes have abdominal pain down to navel when she feels short of breath. Sometimes has nausea with shortness of breath; she has not vomited with this, though she has "dry heaved." Has had 4-5  days of diarrhea as well. No blood in stool. Mildly dark but not tarry; takes iron pill daily. Denies any fevers except 1 week ago had a temperature of 100.  No sick contacts.  Over past 1-2 weeks when walking with walker has been getting dizzy and not walking as well. Tore ACL at some point, though she is unsure how long ago it was.  She has followed up with this with orthopedics and decided not to undergo surgery.  She has not had any falls while being dizzy.  In the ED, patient found to have mild tachypnea with normal O2 sat on room air.  She is afebrile, COVID-negative, no definite pneumonia on x-ray.  Asymmetrical edema on the left greater than right precipitated DVT US which was negative.  BNP elevated at 855, she received Lasix. D-dimer was elevated at 0.8 which precipitated CTA PE which was negative for PE but did demonstrate potential pulmonary arterial hypertension.  Given potential presence of new CHF with pulmonary arterial hypertension in context of patient's progressive weakness, felt admission was reasonable for further workup and diuresis.  Review Of Systems: Per HPI  Pertinent Past Medical History: HTN Marfans syndrome Urinary incontinence Spinal stenosis TIA Chronic lower extremity edema Remainder reviewed in history tab.   Pertinent Past Surgical History: Cyst excision from liver + cholecystectomy C section Eye surgery-for misplaced lens-R eye   IM nail femur Remainder reviewed in history tab.   Pertinent Social History: Tobacco use: Quit 4 years ago-1 PPD since 77 years old  Alcohol use: no Other Substance use: no Lives with daughter and granddaughter and dog "[place]bo [place]" and other daughter lives down the street  Pertinent Family History: Marfans (daughters have) No known cardiac sxs Remainder reviewed in history tab.   Important Outpatient Medications: Metoprolol 50 mg daily Losartan 50 daily Magnesium Lasix 20 (used to take more than that but BP dropped and now only takes 20) ASA 81 Lipitor 40 mg Calcium Vit D Ferrous sulfate Lyrica 75 mg qhs Prolia for osteoporosis OTC eye drops Remainder  reviewed in medication history.   Objective: BP (!) 141/73   Pulse 69   Temp 98.1 F (36.7 C) (Oral)   Resp 10   Ht 5\' 6"  (1.676 m)   Wt 90.7 kg   SpO2 94%   BMI 32.28 kg/m  Exam: General: Alert and oriented, in NAD Skin: Warm, dry, and intact HEENT: NCAT, EOM grossly normal, midline nasal septum Cardiac: RRR, no m/r/g appreciated, JVD difficult to assess due to body habitus, 2+ pitting edema bilaterally to ankles with 1+ pitting edema to mid shins bilaterally Respiratory: Coarse crackles at bilateral lung bases, breathing and speaking comfortably on RA Abdominal: Soft, mildly TTP throughout, nondistended, normoactive bowel sounds Extremities: Moves all extremities grossly equally Neurological: No gross focal deficit Psychiatric: Appropriate mood and affect   Labs:  CBC BMET  Recent Labs  Lab 11/11/22 1008 11/11/22 1434  WBC 8.9  --   HGB 12.9 13.3  HCT 43.3 39.0  PLT 215  --    Recent Labs  Lab 11/11/22 1008 11/11/22 1434  NA 141 141  K 4.2 4.6  CL 102  --   CO2 29  --   BUN 11  --   CREATININE 0.94  --   GLUCOSE 107*  --   CALCIUM 10.0  --      BNP 855 D-dimer 0.8 Troponin 10>9 Lactic acid 1.1 VBG pH 7.4, pCO2 53, pO2 78, bicarb 33, T CO2 35  EKG: NSR, rate 60 bpm, right bundle branch block unchanged from prior in 2021, no STEMI  Imaging Studies Performed:  CXR IMPRESSION: Mild acute congestive heart failure with mildly progressive cardiomegaly, pulmonary vascular congestion and mild interstitial pulmonary edema.  CTA PE IMPRESSION: 1. No evidence of significant pulmonary embolus. 2. Dilated central pulmonary arteries suggesting possibility of pulmonary arterial hypertension. 3. Diffuse nodular enlargement of the thyroid gland, unchanged since prior study. Stability for greater than 5 years implies benignity; no biopsy or followup indicated (ref: J Am Coll Radiol. 2015 Feb;12(2): 143-50). 4. Mild perihilar infiltration in the left lung may  indicate early edema or pneumonia. 5. Large cysts in the liver may indicate giant hepatic cyst versus biliary cystadenoma. This is smaller than on previous study. 6. Mild compression of 2 midthoracic vertebra, likely chronic.  Janeal Holmes, MD 11/11/2022, 11:35 PM PGY-1, [facility] FPTS Intern pager: [phone], text pages welcome Secure chat group [facility] Teaching Service

## 2022-11-12 ENCOUNTER — Other Ambulatory Visit (HOSPITAL_COMMUNITY): Payer: Self-pay

## 2022-11-12 ENCOUNTER — Observation Stay (HOSPITAL_COMMUNITY): Payer: PPO

## 2022-11-12 DIAGNOSIS — Z888 Allergy status to other drugs, medicaments and biological substances status: Secondary | ICD-10-CM | POA: Diagnosis not present

## 2022-11-12 DIAGNOSIS — I110 Hypertensive heart disease with heart failure: Secondary | ICD-10-CM | POA: Diagnosis not present

## 2022-11-12 DIAGNOSIS — M419 Scoliosis, unspecified: Secondary | ICD-10-CM | POA: Diagnosis not present

## 2022-11-12 DIAGNOSIS — Z8673 Personal history of transient ischemic attack (TIA), and cerebral infarction without residual deficits: Secondary | ICD-10-CM | POA: Diagnosis not present

## 2022-11-12 DIAGNOSIS — R197 Diarrhea, unspecified: Secondary | ICD-10-CM | POA: Diagnosis not present

## 2022-11-12 DIAGNOSIS — Z1152 Encounter for screening for COVID-19: Secondary | ICD-10-CM | POA: Diagnosis not present

## 2022-11-12 DIAGNOSIS — I5031 Acute diastolic (congestive) heart failure: Secondary | ICD-10-CM | POA: Diagnosis not present

## 2022-11-12 DIAGNOSIS — I509 Heart failure, unspecified: Secondary | ICD-10-CM | POA: Diagnosis not present

## 2022-11-12 DIAGNOSIS — Z79899 Other long term (current) drug therapy: Secondary | ICD-10-CM | POA: Diagnosis not present

## 2022-11-12 DIAGNOSIS — I2721 Secondary pulmonary arterial hypertension: Secondary | ICD-10-CM | POA: Diagnosis not present

## 2022-11-12 DIAGNOSIS — Z7982 Long term (current) use of aspirin: Secondary | ICD-10-CM | POA: Diagnosis not present

## 2022-11-12 DIAGNOSIS — Z8279 Family history of other congenital malformations, deformations and chromosomal abnormalities: Secondary | ICD-10-CM | POA: Diagnosis not present

## 2022-11-12 DIAGNOSIS — Z9049 Acquired absence of other specified parts of digestive tract: Secondary | ICD-10-CM | POA: Diagnosis not present

## 2022-11-12 DIAGNOSIS — Z87440 Personal history of urinary (tract) infections: Secondary | ICD-10-CM | POA: Diagnosis not present

## 2022-11-12 DIAGNOSIS — Z87891 Personal history of nicotine dependence: Secondary | ICD-10-CM | POA: Diagnosis not present

## 2022-11-12 DIAGNOSIS — I4510 Unspecified right bundle-branch block: Secondary | ICD-10-CM | POA: Diagnosis not present

## 2022-11-12 DIAGNOSIS — G8929 Other chronic pain: Secondary | ICD-10-CM | POA: Diagnosis not present

## 2022-11-12 DIAGNOSIS — M549 Dorsalgia, unspecified: Secondary | ICD-10-CM | POA: Diagnosis not present

## 2022-11-12 DIAGNOSIS — S86012D Strain of left Achilles tendon, subsequent encounter: Secondary | ICD-10-CM | POA: Diagnosis not present

## 2022-11-12 DIAGNOSIS — E785 Hyperlipidemia, unspecified: Secondary | ICD-10-CM | POA: Diagnosis not present

## 2022-11-12 DIAGNOSIS — X58XXXD Exposure to other specified factors, subsequent encounter: Secondary | ICD-10-CM | POA: Diagnosis present

## 2022-11-12 DIAGNOSIS — E041 Nontoxic single thyroid nodule: Secondary | ICD-10-CM | POA: Diagnosis not present

## 2022-11-12 DIAGNOSIS — I3139 Other pericardial effusion (noninflammatory): Secondary | ICD-10-CM | POA: Diagnosis not present

## 2022-11-12 DIAGNOSIS — Q8740 Marfan's syndrome, unspecified: Secondary | ICD-10-CM | POA: Diagnosis not present

## 2022-11-12 DIAGNOSIS — M810 Age-related osteoporosis without current pathological fracture: Secondary | ICD-10-CM | POA: Diagnosis not present

## 2022-11-12 LAB — BASIC METABOLIC PANEL
Anion gap: 11 (ref 5–15)
BUN: 10 mg/dL (ref 8–23)
CO2: 28 mmol/L (ref 22–32)
Calcium: 10 mg/dL (ref 8.9–10.3)
Chloride: 101 mmol/L (ref 98–111)
Creatinine, Ser: 0.97 mg/dL (ref 0.44–1.00)
GFR, Estimated: >60 mL/min (ref >60)
Glucose, Bld: 101 mg/dL — ABNORMAL HIGH (ref 70–99)
Potassium: 3.9 mmol/L (ref 3.5–5.1)
Sodium: 140 mmol/L (ref 135–145)

## 2022-11-12 LAB — ECHOCARDIOGRAM COMPLETE
AR max vel: 3.05 cm2
AV Area VTI: 3.01 cm2
AV Area mean vel: 2.87 cm2
AV Mean grad: 4 mmHg
AV Peak grad: 7.4 mmHg
Ao pk vel: 1.36 m/s
Area-P 1/2: 2.17 cm2
Calc EF: 59.4 %
Height: 66 in
MV VTI: 3.3 cm2
P 1/2 time: 526 msec
S' Lateral: 3.2 cm
Single Plane A2C EF: 64.4 %
Single Plane A4C EF: 57.7 %
Weight: 3200 oz

## 2022-11-12 LAB — BLOOD GAS, VENOUS
Acid-Base Excess: 7.4 mmol/L — ABNORMAL HIGH (ref 0.0–2.0)
Bicarbonate: 31.2 mmol/L — ABNORMAL HIGH (ref 20.0–28.0)
O2 Saturation: 90.8 %
Patient temperature: 37
pCO2, Ven: 40 mmHg — ABNORMAL LOW (ref 44–60)
pH, Ven: 7.5 — ABNORMAL HIGH (ref 7.25–7.43)
pO2, Ven: 56 mmHg — ABNORMAL HIGH (ref 32–45)

## 2022-11-12 LAB — MAGNESIUM: Magnesium: 1.9 mg/dL (ref 1.7–2.4)

## 2022-11-12 MED ORDER — CAPSAICIN 0.025 % EX CREA
TOPICAL_CREAM | Freq: Two times a day (BID) | CUTANEOUS | Status: DC
  Filled 2022-11-12: qty 60

## 2022-11-12 MED ORDER — FUROSEMIDE 10 MG/ML IJ SOLN
40.0000 mg | Freq: Once | INTRAMUSCULAR | Status: AC
  Administered 2022-11-12: 40 mg via INTRAVENOUS
  Filled 2022-11-12: qty 4

## 2022-11-12 MED ORDER — LIDOCAINE 5 % EX PTCH
2.0000 | MEDICATED_PATCH | CUTANEOUS | Status: DC
  Administered 2022-11-12: 2 via TRANSDERMAL
  Filled 2022-11-12: qty 2

## 2022-11-12 MED ORDER — LIDOCAINE 5 % EX PTCH: 1.0000 | MEDICATED_PATCH | CUTANEOUS | Status: DC

## 2022-11-12 MED ORDER — ACETAMINOPHEN 325 MG PO TABS
650.0000 mg | ORAL_TABLET | Freq: Four times a day (QID) | ORAL | Status: DC | PRN
  Administered 2022-11-13: 650 mg via ORAL
  Filled 2022-11-12: qty 2

## 2022-11-12 NOTE — Evaluation (Signed)
Occupational Therapy Evaluation Patient Details Name: Leslie Dean MRN: [id number] DOB: [birth year] Today's Date: 11/12/2022   History of Present Illness 77 y.o. female presents to [facility] hospital on 11/11/2022 with SOB, concerning for CHF exacerbation. Pt also with recent L achilles tear. PMH includes HTN, Marfans syndrome, urinary incontinence, spinal stenosis, TIA, chronic LE edema.   Clinical Impression   This 77 yo female admitted with above presents to acute OT with PLOF of needing minimal A with some basic ADLs since her ankle injury in January 2024. Mainly getting around in the home with wheelchair and only using RW when wheelchair won't go through doorways. She currently is at a setup/S-Mod A level for basic ADLs and min A for sit<>stand and side step up towards HOB. She will continue to benefit from acute OT with follow up HHOT and family with her 24/7.      Recommendations for follow up therapy are one component of a multi-disciplinary discharge planning process, led by the attending physician.  Recommendations may be updated based on patient status, additional functional criteria and insurance authorization.   Assistance Recommended at Discharge Frequent or constant Supervision/Assistance  Patient can return home with the following A little help with walking and/or transfers;A lot of help with bathing/dressing/bathroom;Assistance with cooking/housework;Help with stairs or ramp for entrance;Assist for transportation    Functional Status Assessment  Patient has had a recent decline in their functional status and demonstrates the ability to make significant improvements in function in a reasonable and predictable amount of time.  Equipment Recommendations  None recommended by OT       Precautions / Restrictions Precautions Precautions: Fall Required Braces or Orthoses: Other Brace Other Brace: CAM boot for L ankle Restrictions Weight Bearing Restrictions: No      Mobility Bed  Mobility Overal bed mobility: Needs Assistance Bed Mobility: Supine to Sit, Sit to Supine     Supine to sit: Min guard Sit to supine: Min assist   General bed mobility comments: increased time    Transfers Overall transfer level: Needs assistance   Transfers: Sit to/from Stand Sit to Stand: Min assist                  Balance Overall balance assessment: Needs assistance Sitting-balance support: No upper extremity supported, Feet supported Sitting balance-Leahy Scale: Good     Standing balance support: Bilateral upper extremity supported, Reliant on assistive device for balance Standing balance-Leahy Scale: Poor                             ADL either performed or assessed with clinical judgement   ADL Overall ADL's : Needs assistance/impaired Eating/Feeding: Independent;Sitting   Grooming: Set up;Sitting   Upper Body Bathing: Set up;Sitting   Lower Body Bathing: Minimal assistance;Sit to/from stand   Upper Body Dressing : Set up;Sitting   Lower Body Dressing: Moderate assistance Lower Body Dressing Details (indicate cue type and reason): min A sit<>stand; dtr doing cam boot pta Toilet Transfer: Minimal assistance Toilet Transfer Details (indicate cue type and reason): side step up to [facility] using back of chair as a walker to hold onto as she stepped sideways Toileting- Clothing Manipulation and Hygiene: Moderate assistance Toileting - Clothing Manipulation Details (indicate cue type and reason): min A sit<>stand              Pertinent Vitals/Pain Pain Assessment Pain Assessment: No/denies pain     Hand Dominance Right  Extremity/Trunk Assessment Upper Extremity Assessment Upper Extremity Assessment: Generalized weakness           Communication Communication Communication: No difficulties   Cognition Arousal/Alertness: Awake/alert Behavior During Therapy: WFL for tasks assessed/performed                                    General Comments: Pt continued to report ACL injury back in Jan of 2024; per MD note then it was her ankle that was bothering her so she was placed in a cam boot told to be NWB'ing and sent for an xray that did not show any fracture; no follow up notes in her chart since that, but pt states she continues to wear Cam boot                Home Living Family/patient expects to be discharged to:: Private residence Living Arrangements: Children Available Help at Discharge: Family;Available 24 hours/day Type of Home: House Home Access: Stairs to enter [employer] of Steps: 1   Home Layout: Multi-level Alternate Level Stairs-Number of Steps: 1             Home Equipment: Agricultural consultant (2 wheels);Rollator (4 wheels);Shower seat;Toilet riser;Wheelchair - manual          Prior Functioning/Environment Prior Level of Function : Needs assist             Mobility Comments: Pt reports she mainly gets around in the house with wheelchair and only uses RW when can't get RW through certain doors in home, rollator out of the home. Ambulation has been significantly limited over the last month. Initial L ankle injury in January 2024          OT Problem List: Decreased strength;Decreased range of motion;Obesity;Impaired balance (sitting and/or standing)      OT Treatment/Interventions: Self-care/ADL training;DME and/or AE instruction;Patient/family education;Balance training    OT Goals(Current goals can be found in the care plan section) Acute Rehab OT Goals Patient Stated Goal: to go home OT Goal Formulation: With patient/family Time For Goal Achievement: 11/26/22 Potential to Achieve Goals: Good  OT Frequency: Min 1X/week       AM-PAC OT "6 Clicks" Daily Activity     Outcome Measure Help from another person eating meals?: None Help from another person taking care of personal grooming?: A Little Help from another person toileting, which includes using toliet,  bedpan, or urinal?: A Lot Help from another person bathing (including washing, rinsing, drying)?: A Little Help from another person to put on and taking off regular upper body clothing?: A Little Help from another person to put on and taking off regular lower body clothing?: A Lot 6 Click Score: 17   End of Session Equipment Utilized During Treatment: Gait belt  Activity Tolerance: Patient tolerated treatment well Patient left: in bed;with call bell/phone within reach;with family/visitor present  OT Visit Diagnosis: Unsteadiness on feet (R26.81);Other abnormalities of gait and mobility (R26.89);Muscle weakness (generalized) (M62.81)                Time: 1325-1400 OT Time Calculation (min): 35 min Charges:  OT General Charges $OT Visit: 1 Visit OT Evaluation $OT Eval Moderate Complexity: 1 Mod OT Treatments $Self Care/Home Management : 8-22 mins Lindon Romp OT Acute Rehabilitation Services Office [phone]    Evette Georges 11/12/2022, 4:43 PM

## 2022-11-12 NOTE — Consult Note (Addendum)
Cardiology Consultation   Patient ID: [name] MRN: [id number]; DOB: [birth year]  Admit date: 11/11/2022 Date of Consult: 11/12/2022  PCP:  Evelena Leyden, DO   [facility] Providers Cardiologist:  New to Dr [name] , used to see Dr Rockne Coons and now wishes to switch to [facility]    Patient Profile:   Leslie Dean is a 77 y.o. female with a hx of Marfan's syndrome, TIA,  HTN, scoliosis, spinal stenosis, RBBB, chronic BLE edema,  osteoporosis, chronic back pain, who is being seen 11/12/2022 for the evaluation of CHF at the request of Dr Miquel Dunn.  History of Present Illness:   [name] with above PMH presented to ER yesterday c/o 1-2 weeks of dyspnea with exertion. She reports a dry non-productive cough and hoarseness for 1 week. She noted worsening of chronic legs edema over the past 2 weeks as well.  She feels increasingly SOB with exertion, felt she can walk less distance with a walker, moving around makes her winded. She denied ever having any chest pain. She has chronic back pain. She states her legs are swollen for years, but swelling has been much worse since her left ankle fracture and limited walking.  She denied any history of sleep apnea, takes Lasix 20 mg daily at home for leg edema, denied history of known CHF. She was adopted and has no known family hx of heart diease.   Admission diagnostic showed unremarkable CMP. BNP elevated 855. Hs trop 10 >9. CBC unremarkable. D dimer 0.8. CTA chest showed no evidence of PE, dilated central pulmonary arteries suggest possibility of pulmonary arterial hypertension, diffuse nodular enlargement of thyroid gland, mild perihilar infiltration in the left lung may indicate early edema or pneumonia, large hepatic cyst versus biliary cystadenoma, mild compression of to mid thoracic vertebra. She was admitted to teaching service for CHF exacerbation.  She was given IV Lasix 40 mg x 2 doses, states she had urinated at least 1 full canister during  ER stay. She feels overall improved and decreased edema of legs.   She has no known cardiac disease. Echo from 04/06/20 was done in the setting of TIA, showed LVEF 55-60%, no RWMA, mild LVH, grade I DD, normal RV, mild LAE, trivial MR, mild dilatation of ascending aorta 37 mm. She is historically on losartan and metoprolol XL for HTN, on lipitor for HLD.   Echo from today showed LVEF 55-60%, mild concentric LVH, grade I DD, normal RV, trivial AI, moderately dilated pulmonary artery, small-moderate sized circumferential pericardial effusion without signs of cardiac tamponade.    Past Medical History:  Diagnosis Date   Hepatic cyst 12/03/2012   Hypertension    Scoliosis    Spinal stenosis    Stroke (HCC)    03/24/2020    Past Surgical History:  Procedure Laterality Date   CESAREAN SECTION     CHOLECYSTECTOMY N/A 12/04/2012   Procedure: LAPAROSCOPIC Liver Cyst Unroofing and removal of gallbladder;  Surgeon: Almond Lint, MD;  Location: [facility];  Service: General;  Laterality: N/A;   CYST EXCISION     liver cyst removal   EYE SURGERY     FEMUR IM NAIL Left 10/18/2019   Procedure: INTRAMEDULLARY (IM) NAIL FEMORAL;  Surgeon: Durene Romans, MD;  Location: [facility];  Service: Orthopedics;  Laterality: Left;     Home Medications:  Prior to Admission medications   Medication Sig Start Date End Date Taking? Authorizing Provider  aspirin 81 MG chewable tablet Chew by mouth.  Yes [provider]  atorvastatin (LIPITOR) 40 MG tablet TAKE 1 TABLET(40 MG) BY MOUTH DAILY Patient taking differently: Take 40 mg by mouth daily. 07/25/22  Yes Lilland, Alana, DO  Calcium Carb-Cholecalciferol (CALCIUM-VITAMIN D) 600-400 MG-UNIT TABS Take 1 tablet by mouth daily.   Yes [provider]  Cholecalciferol (VITAMIN D3) 50 MCG (2000 UT) capsule Take 2,000 Units by mouth daily.   Yes [provider]  ferrous sulfate 325 (65 FE) MG tablet Take 325 mg by mouth daily with breakfast.   Yes  [provider]  furosemide (LASIX) 20 MG tablet Take 20 mg by mouth daily.   Yes [provider]  losartan (COZAAR) 50 MG tablet Take 50 mg by mouth daily.    Yes [provider]  Magnesium Chloride-Calcium 64-106 MG TBEC Take 1 tablet by mouth daily.   Yes [provider]  metoprolol succinate (TOPROL-XL) 50 MG 24 hr tablet Take 50 mg by mouth daily. Take with or immediately following a meal.   Yes [provider]  pregabalin (LYRICA) 75 MG capsule Take 75 mg by mouth at bedtime. 02/20/22  Yes [provider]  PROLIA 60 MG/ML SOSY injection Inject into the skin. 11/02/21   [provider]    Inpatient Medications: Scheduled Meds:  aspirin  81 mg Oral Daily   atorvastatin  40 mg Oral Daily   capsaicin   Topical BID   cholecalciferol  2,000 Units Oral Daily   enoxaparin (LOVENOX) injection  40 mg Subcutaneous Q24H   ferrous sulfate  325 mg Oral Q breakfast   losartan  50 mg Oral Daily   metoprolol succinate  50 mg Oral Daily   pregabalin  75 mg Oral QHS   Continuous Infusions:  PRN Meds:   Allergies:    Allergies  Allergen Reactions   Albuterol Sulfate    Hydromorphone Hcl    Other     Says narcotics cause nausea for her    Social History:   Social History   Socioeconomic History   Marital status: Divorced    Spouse name: Not on file   Number of children: Not on file   Years of education: Not on file   Highest education level: Not on file  Occupational History   Not on file  Tobacco Use   Smoking status: Former    Packs/day: 1    Types: Cigarettes    Quit date: 10/09/2019    Years since quitting: 3.0    Passive exposure: Past   Smokeless tobacco: Never  Substance and Sexual Activity   Alcohol use: No   Drug use: No   Sexual activity: Not on file  Other Topics Concern   Not on file  Social History Narrative   Lives with daughter and grandaughter   Right Handed   Drinks 1-2 cups caffeine daily    Social Determinants of Health   Financial Resource Strain: Not on file  Food Insecurity: No Food Insecurity (11/12/2022)   Hunger Vital Sign    Worried About Running Out of Food in the Last Year: Never true    Ran Out of Food in the Last Year: Never true  Transportation Needs: No Transportation Needs (11/12/2022)   PRAPARE - Administrator, Civil Service (Medical): No    Lack of Transportation (Non-Medical): No  Physical Activity: Not on file  Stress: Not on file  Social Connections: Not on file  Intimate Partner Violence: Not At Risk (11/12/2022)   Humiliation,  Afraid, Rape, and Kick questionnaire    Fear of Current or Ex-Partner: No    Emotionally Abused: No    Physically Abused: No    Sexually Abused: No    Family History:    Family History  Problem Relation Age of Onset   Marfan syndrome Grandchild      ROS:  Constitutional: Denied fever, chills, malaise, night sweats Eyes: Denied vision change or loss Ears/Nose/Mouth/Throat: Denied ear ache, sore throat, coughing, sinus pain Cardiovascular: see HPI  Respiratory:see HPI Gastrointestinal: Denied nausea, vomiting, abdominal pain, diarrhea Genital/Urinary: Denied dysuria, hematuria, urinary frequency/urgency Musculoskeletal: weakness  Skin: Denied rash, wound Neuro: Denied headache, dizziness, syncope Psych: Denied history of depression/anxiety  Endocrine: Denied history of diabetes  Physical Exam/Data:   Vitals:   11/12/22 1300 11/12/22 1315 11/12/22 1415 11/12/22 1459  BP: 125/77 112/66 97/61 (!) 117/55  Pulse: 76 78 90 86  Resp: (!) 24 19 20  (!) 21  Temp:    98.3 F (36.8 C)  TempSrc:    Oral  SpO2: 95% 93% 95% 95%  Weight:    93 kg  Height:    5\' 6"  (1.676 m)    Intake/Output Summary (Last 24 hours) at 11/12/2022 1614 Last data filed at 11/12/2022 1427 Gross per 24 hour  Intake --  Output 900 ml  Net -900 ml      11/12/2022    2:59 PM 11/11/2022   10:05 AM  Last 3 Weights  Weight (lbs) 205 lb  0.4 oz 200 lb  Weight (kg) 93 kg 90.719 kg     Body mass index is 33.09 kg/m.  Vitals:  Vitals:   11/12/22 1415 11/12/22 1459  BP: 97/61 (!) 117/55  Pulse: 90 86  Resp: 20 (!) 21  Temp:  98.3 F (36.8 C)  SpO2: 95% 95%   General Appearance: In no apparent distress, laying in bed, well nourished  HEENT: Normocephalic, atraumatic.  Neck: Supple, trachea midline, JVD up to mid neck with HOB 45 degree  Cardiovascular: Regular rate and rhythm, normal S1-S2,  no murmur Respiratory: Resting breathing unlabored, lungs sounds clear to auscultation bilaterally, no use of accessory muscles. On room air.  No wheezes, rales or rhonchi.   Gastrointestinal: Bowel sounds positive, abdomen soft, non-tender, non-distended Extremities: Able to move all extremities in bed without difficulty, trace edema of BLE Musculoskeletal: Normal muscle bulk and tone Skin: Intact, warm, dry. No rashes or petechiae noted in exposed areas.  Neurologic: Alert, oriented to person, place and time. No cognitive deficit, no focal deficit, face is not symmetrical  Psychiatric: Normal affect. Mood is appropriate.     EKG:  The EKG was personally reviewed and demonstrates:    EKG from 11/11/22 showed sinusr rhythm 61 bpm, TWI of inferior leads, RBBB  Telemetry:  Telemetry was personally reviewed and demonstrates:    Sinus rhythm   Relevant CV Studies:  Echo from today:    1. Left ventricular ejection fraction, by estimation, is 55 to 60%. The  left ventricle has normal function. The left ventricle has no regional  wall motion abnormalities. There is mild concentric left ventricular  hypertrophy. Left ventricular diastolic  parameters are consistent with Grade I diastolic dysfunction (impaired  relaxation).   2. Right ventricular systolic function is normal. The right ventricular  size is normal.   3. The mitral valve is grossly normal. No evidence of mitral valve  regurgitation. No evidence of mitral stenosis.    4. The aortic valve is normal in structure.  Aortic valve regurgitation is  trivial. No aortic stenosis is present.   5. Moderately dilated pulmonary artery.   6. There is a small-moderate sized circumferential pericardial effusion.  There is some tricuspid and mitral inflow variation but otherwise no signs  of cardiac tamponade (No diastolic RA/RV collapse, IVC 1.7cm).    Laboratory Data:  High Sensitivity Troponin:   Recent Labs  Lab 11/11/22 1008 11/11/22 1122  TROPONINIHS 10 9     Chemistry Recent Labs  Lab 11/11/22 1008 11/11/22 1434 11/12/22 0121  NA 141 141 140  K 4.2 4.6 3.9  CL 102  --  101  CO2 29  --  28  GLUCOSE 107*  --  101*  BUN 11  --  10  CREATININE 0.94  --  0.97  CALCIUM 10.0  --  10.0  MG  --   --  1.9  GFRNONAA >60  --  >60  ANIONGAP 10  --  11    Recent Labs  Lab 11/11/22 1008  PROT 6.3*  ALBUMIN 3.3*  AST 15  ALT 14  ALKPHOS 50  BILITOT 1.2   Lipids No results for input(s): "CHOL", "TRIG", "HDL", "LABVLDL", "LDLCALC", "CHOLHDL" in the last 168 hours.  Hematology Recent Labs  Lab 11/11/22 1008 11/11/22 1434  WBC 8.9  --   RBC 4.70  --   HGB 12.9 13.3  HCT 43.3 39.0  MCV 92.1  --   MCH 27.4  --   MCHC 29.8*  --   RDW 15.2  --   PLT 215  --    Thyroid No results for input(s): "TSH", "FREET4" in the last 168 hours.  BNP Recent Labs  Lab 11/11/22 1008  BNP 855.3*    DDimer  Recent Labs  Lab 11/11/22 1008  DDIMER 0.80*     Radiology/Studies:  ECHOCARDIOGRAM COMPLETE  Result Date: 11/12/2022    ECHOCARDIOGRAM REPORT   Patient Name:   Leslie Dean Date of Exam: 11/12/2022 Medical Rec #:  [id number]       Height:       66.0 in Accession #:    [id number]      Weight:       200.0 lb Date of Birth:  [birth year]      BSA:          2.000 m Patient Age:    26 years        BP:           110/62 mmHg Patient Gender: F               HR:           80 bpm. Exam Location:  Inpatient Procedure: 2D Echo, Cardiac Doppler and Color Doppler  Indications:    Congestive Heart Failure  History:        Patient has prior history of Echocardiogram examinations, most                 recent 04/06/2020. CHF, TIA and Marfan's, spinal stenosis,                 Signs/Symptoms:Edema and Shortness of Breath; Risk                 Factors:Hypertension and Former Smoker.  Sonographer:    Wallie Char Referring Phys: [id number] MARGARET E PRAY  Sonographer Comments: Technically difficult study due to poor echo windows. Image acquisition challenging due to respiratory motion. IMPRESSIONS  1. Left ventricular ejection fraction, by  estimation, is 55 to 60%. The left ventricle has normal function. The left ventricle has no regional wall motion abnormalities. There is mild concentric left ventricular hypertrophy. Left ventricular diastolic parameters are consistent with Grade I diastolic dysfunction (impaired relaxation).  2. Right ventricular systolic function is normal. The right ventricular size is normal.  3. The mitral valve is grossly normal. No evidence of mitral valve regurgitation. No evidence of mitral stenosis.  4. The aortic valve is normal in structure. Aortic valve regurgitation is trivial. No aortic stenosis is present.  5. Moderately dilated pulmonary artery.  6. There is a small-moderate sized circumferential pericardial effusion. There is some tricuspid and mitral inflow variation but otherwise no signs of cardiac tamponade (No diastolic RA/RV collapse, IVC 1.7cm). FINDINGS  Left Ventricle: Left ventricular ejection fraction, by estimation, is 55 to 60%. The left ventricle has normal function. The left ventricle has no regional wall motion abnormalities. The left ventricular internal cavity size was normal in size. There is  mild concentric left ventricular hypertrophy. Left ventricular diastolic parameters are consistent with Grade I diastolic dysfunction (impaired relaxation). Right Ventricle: The right ventricular size is normal. No increase in right  ventricular wall thickness. Right ventricular systolic function is normal. Left Atrium: Left atrial size was normal in size. Right Atrium: Right atrial size was normal in size. Pericardium: A small pericardial effusion is present. The pericardial effusion is circumferential. The pericardial effusion appears to contain fibrous material. Mitral Valve: The mitral valve is grossly normal. Mild mitral annular calcification. No evidence of mitral valve regurgitation. No evidence of mitral valve stenosis. MV peak gradient, 2.4 mmHg. The mean mitral valve gradient is 1.0 mmHg. Tricuspid Valve: The tricuspid valve is normal in structure. Tricuspid valve regurgitation is not demonstrated. No evidence of tricuspid stenosis. Aortic Valve: The aortic valve is normal in structure. Aortic valve regurgitation is trivial. Aortic regurgitation PHT measures 526 msec. No aortic stenosis is present. Aortic valve mean gradient measures 4.0 mmHg. Aortic valve peak gradient measures 7.4  mmHg. Aortic valve area, by VTI measures 3.01 cm. Pulmonic Valve: The pulmonic valve was normal in structure. Pulmonic valve regurgitation is not visualized. No evidence of pulmonic stenosis. Aorta: The aortic root is normal in size and structure. Pulmonary Artery: The pulmonary artery is moderately dilated. Venous: The inferior vena cava is normal in size with greater than 50% respiratory variability, suggesting right atrial pressure of 3 mmHg. IAS/Shunts: No atrial level shunt detected by color flow Doppler.  LEFT VENTRICLE PLAX 2D LVIDd:         4.10 cm     Diastology LVIDs:         3.20 cm     LV e' medial:    3.71 cm/s LV PW:         1.30 cm     LV E/e' medial:  14.7 LV IVS:        1.30 cm     LV e' lateral:   5.26 cm/s LVOT diam:     2.00 cm     LV E/e' lateral: 10.3 LV SV:         76 LV SV Index:   38 LVOT Area:     3.14 cm  LV Volumes (MOD) LV vol d, MOD A2C: 73.6 ml LV vol d, MOD A4C: 87.0 ml LV vol s, MOD A2C: 26.2 ml LV vol s, MOD A4C: 36.8 ml  LV SV MOD A2C:     47.4 ml LV SV MOD A4C:  87.0 ml LV SV MOD BP:      47.7 ml RIGHT VENTRICLE         IVC TAPSE (M-mode): 1.9 cm  IVC diam: 1.70 cm LEFT ATRIUM             Index        RIGHT ATRIUM           Index LA diam:        4.00 cm 2.00 cm/m   RA Area:     16.60 cm LA Vol (A2C):   65.6 ml 32.80 ml/m  RA Volume:   48.30 ml  24.15 ml/m LA Vol (A4C):   64.8 ml 32.40 ml/m LA Biplane Vol: 67.3 ml 33.65 ml/m  AORTIC VALVE AV Area (Vmax):    3.05 cm AV Area (Vmean):   2.87 cm AV Area (VTI):     3.01 cm AV Vmax:           136.00 cm/s AV Vmean:          93.750 cm/s AV VTI:            0.254 m AV Peak Grad:      7.4 mmHg AV Mean Grad:      4.0 mmHg LVOT Vmax:         132.00 cm/s LVOT Vmean:        85.600 cm/s LVOT VTI:          0.243 m LVOT/AV VTI ratio: 0.96 AI PHT:            526 msec  AORTA Ao Root diam: 4.40 cm Ao Asc diam:  3.90 cm MITRAL VALVE               TRICUSPID VALVE MV Area (PHT): 2.17 cm    TR Peak grad:   39.7 mmHg MV Area VTI:   3.30 cm    TR Vmax:        315.00 cm/s MV Peak grad:  2.4 mmHg MV Mean grad:  1.0 mmHg    SHUNTS MV Vmax:       0.77 m/s    Systemic VTI:  0.24 m MV Vmean:      46.7 cm/s   Systemic Diam: 2.00 cm MV Decel Time: 350 msec MV E velocity: 54.40 cm/s MV A velocity: 79.90 cm/s MV E/A ratio:  0.68 Aditya Sabharwal Electronically signed by Dorthula Nettles Signature Date/Time: 11/12/2022/10:25:45 AM    Final    CT Angio Chest PE W and/or Wo Contrast  Result Date: 11/11/2022 CLINICAL DATA:  Pulmonary embolus suspected with high probability. EXAM: CT ANGIOGRAPHY CHEST WITH CONTRAST TECHNIQUE: Multidetector CT imaging of the chest was performed using the standard protocol during bolus administration of intravenous contrast. Multiplanar CT image reconstructions and MIPs were obtained to evaluate the vascular anatomy. RADIATION DOSE REDUCTION: This exam was performed according to the departmental dose-optimization program which includes automated exposure control, adjustment of  the mA and/or kV according to patient size and/or use of iterative reconstruction technique. CONTRAST:  75mL OMNIPAQUE IOHEXOL 350 MG/ML SOLN COMPARISON:  11/29/2012 FINDINGS: Cardiovascular: Technically adequate study with good opacification of the central and segmental pulmonary arteries. No focal filling defects. No evidence of significant pulmonary embolus. Main pulmonary arteries are dilated, suggesting possibility of pulmonary arterial hypertension. Heart size is normal. There is a small pericardial effusion. Normal caliber thoracic aorta. Calcification of the aorta and coronary arteries. Mediastinum/Nodes: Diffuse nodular enlargement of the thyroid gland is similar to prior study consistent  with multinodular goiter. No imaging follow-up is indicated. Scattered lymph nodes are prominent, with largest left aortopulmonic window node measuring 11 mm diameter. Appearances are nonspecific but likely to be reactive. Esophagus is decompressed. Lungs/Pleura: Dependent atelectasis in the posterior lungs. Mild perihilar infiltration on the left may indicate early edema or pneumonia. Upper Abdomen: Large cyst, 11.8 cm, in the liver with thin calcifications in the wall. Cyst is decreased in size since previous study. Appearance suggests a giant hepatic cyst versus biliary cystadenoma. Additional smaller cysts are present. No acute process identified. Musculoskeletal: Degenerative changes in the spine. Mild compression of 2 midthoracic vertebrae. This is likely chronic as no acute cortical changes are identified. Review of the MIP images confirms the above findings. IMPRESSION: 1. No evidence of significant pulmonary embolus. 2. Dilated central pulmonary arteries suggesting possibility of pulmonary arterial hypertension. 3. Diffuse nodular enlargement of the thyroid gland, unchanged since prior study. Stability for greater than 5 years implies benignity; no biopsy or followup indicated (ref: J Am Coll Radiol. 2015  Feb;12(2): 143-50). 4. Mild perihilar infiltration in the left lung may indicate early edema or pneumonia. 5. Large cysts in the liver may indicate giant hepatic cyst versus biliary cystadenoma. This is smaller than on previous study. 6. Mild compression of 2 midthoracic vertebra, likely chronic. Electronically Signed   By: Burman Nieves M.D.   On: 11/11/2022 19:45   VAS [place] LOWER EXTREMITY VENOUS (DVT)  Result Date: 11/11/2022  Lower Venous DVT Study Patient Name:  Leslie Dean  Date of Exam:   11/11/2022 Medical Rec #: [id number]        Accession #:    [id number] Date of Birth: Jan 11, 1946       Patient Gender: F Patient Age:   36 years Exam Location:  [facility] Procedure:      VAS [place] LOWER EXTREMITY VENOUS (DVT) Referring Phys: Duwayne Heck RAY --------------------------------------------------------------------------------  Indications: Swelling.  Limitations: Patient could not tolerate compressions. Comparison Study: No prior study on file Performing Technologist: Sherren Kerns RVS  Examination Guidelines: A complete evaluation includes B-mode imaging, spectral Doppler, color Doppler, and power Doppler as needed of all accessible portions of each vessel. Bilateral testing is considered an integral part of a complete examination. Limited examinations for reoccurring indications may be performed as noted. The reflux portion of the exam is performed with the patient in reverse Trendelenburg.  +---------+---------------+---------+-----------+----------+-------------------+ RIGHT    CompressibilityPhasicitySpontaneityPropertiesThrombus Aging      +---------+---------------+---------+-----------+----------+-------------------+ CFV      Full           Yes      Yes                                      +---------+---------------+---------+-----------+----------+-------------------+ SFJ      Full                                                              +---------+---------------+---------+-----------+----------+-------------------+ FV Prox                 Yes      Yes                  patent by color and  Doppler             +---------+---------------+---------+-----------+----------+-------------------+ FV Mid                  Yes      Yes                  patent by color and                                                       Doppler             +---------+---------------+---------+-----------+----------+-------------------+ FV Distal               Yes      Yes                  patent by color and                                                       Doppler             +---------+---------------+---------+-----------+----------+-------------------+ PFV                     Yes      Yes                  patent by color and                                                       Doppler             +---------+---------------+---------+-----------+----------+-------------------+ POP      Full           Yes      Yes                                      +---------+---------------+---------+-----------+----------+-------------------+ PTV      Full                                                             +---------+---------------+---------+-----------+----------+-------------------+ PERO     Full                                                             +---------+---------------+---------+-----------+----------+-------------------+   +---------+---------------+---------+-----------+----------+--------------+ LEFT     CompressibilityPhasicitySpontaneityPropertiesThrombus Aging +---------+---------------+---------+-----------+----------+--------------+ CFV      Full           Yes      Yes                                 +---------+---------------+---------+-----------+----------+--------------+  SFJ      Full                                                         +---------+---------------+---------+-----------+----------+--------------+ FV Prox  Full                                                        +---------+---------------+---------+-----------+----------+--------------+ FV Mid   Full           Yes      Yes                                 +---------+---------------+---------+-----------+----------+--------------+ FV DistalFull                                                        +---------+---------------+---------+-----------+----------+--------------+ PFV      Full                                                        +---------+---------------+---------+-----------+----------+--------------+ POP      Full           Yes      Yes                                 +---------+---------------+---------+-----------+----------+--------------+ PTV      Full                                                        +---------+---------------+---------+-----------+----------+--------------+ PERO     Full                                                        +---------+---------------+---------+-----------+----------+--------------+     Summary: BILATERAL: - No evidence of deep vein thrombosis seen in the lower extremities, bilaterally. -No evidence of popliteal cyst, bilaterally.   *See table(s) above for measurements and observations. Electronically signed by Lemar Livings MD on 11/11/2022 at 4:39:18 PM.    Final    DG Chest Port 1 View  Result Date: 11/11/2022 CLINICAL DATA:  Dyspnea. EXAM: PORTABLE CHEST 1 VIEW COMPARISON:  10/21/2019 FINDINGS: Improved inspiration. Operative progressive enlargement of the cardiac silhouette with mildly increased pulmonary vascular congestion and interval mild diffuse interstitial pulmonary edema. No pleural fluid. Diffuse osteopenia. Atheromatous aortic arch calcifications. IMPRESSION: Mild acute congestive heart failure with mildly  progressive cardiomegaly, pulmonary vascular  congestion and mild interstitial pulmonary edema. Electronically Signed   By: Beckie Salts M.D.   On: 11/11/2022 11:20     Assessment and Plan:   Acute diastolic heart failure, new diagnosis  -Presented with increased shortness of breath with exertion, decreased activity tolerance, worsening of chronic bilateral lower extremity edema over the past 1 to 2 weeks; denied known history of CHF in the past -BNP 855, high sensitive troponin negative x 2, POA  -EKG reveals sinus rhythm, RBBB, nonspecific T wave inversion of inferior leads -Echocardiogram from today revealed LVEF 55 to 60%, no regional motion abnormality, mild concentric LVH, grade 1 DD, normal RV, trivial AI, moderate dilated pulmonary artery -Status post IV Lasix x 80 mg, UOP at least 1 L over the past 24 hours, near euvolemic -Will discharge home on Lasix 40 mg daily, PTA 20 mg daily for leg edema -GDMT: Continue PTA losartan and metoprolol XL, if blood pressure has room and renal function tolerates, may add Farxiga at follow-up visit  Pulmonary arterial hypertension -Noted on echocardiogram and CTA chest -Consider outpatient sleep study  Hypertension -Blood pressure is low normal at this time, continue PTA losartan and metoprolol  Marfan syndrome -Echo this admission revealed normal aortic valve, trivial AI, stenosis -CTA chest negative for PE or acute aortic etiology  Thyroid nodule Left Achilles tendon tear Chronic back pain Hx of TIA  - per primary team    Risk Assessment/Risk Scores:  Functional Class NYHA Class II        For questions or updates, please contact [facility] Please consult [email or web address] for contact info under    Signed, Cyndi Bender, NP  11/12/2022 4:14 PM  Attending Note:   The patient was seen and examined.  Agree with assessment and plan as noted above.  Changes made to the above note as needed.  Patient seen and independently  examined with Cyndi Bender, NP.   We discussed all aspects of the encounter. I agree with the assessment and plan as stated above.   Acute diastolic CHF:   pt was admitted with acute diastolic CHF and volume overload .  She has been well controlled typically.  She eats a very high salty diet - loves bacon and other processed meats.  Eats take out [place] food .  She has no significant valvular disease attributable to her Marfan's syndrome   She has already be diuresed fairly well Still has some rales in her bases ( which may be due to atelectasis)  Will increase lasix to 40 mg a day Will encourage her to reduce the sodium in her diet   Will follow up with tomorrow and then  Will have her follow up with [place] in the clinic in several weeks for office visit and BMP     I have spent a total of 40 minutes with patient reviewing hospital  notes , telemetry, EKGs, labs and examining patient as well as establishing an assessment and plan that was discussed with the patient.  > 50% of time was spent in direct patient care.    Vesta Mixer, Montez Hageman., MD, [facility] 11/12/2022, 5:05 PM [address]  [address] Office [phone] Pager [phone]

## 2022-11-12 NOTE — Progress Notes (Signed)
Daily Progress Note Intern Pager: [phone]  Patient name: Leslie Dean Medical record number: [id number] Date of birth: 11-10-1945 Age: 77 y.o. Gender: female  Primary Care Provider: Evelena Leyden, DO Consultants: None Code Status: Full  Pt Overview and Major Events to Date:  Admitted 11/11/22  Assessment and Plan: Leslie Dean is a 77 y.o. female admitted for new onset heart failure. Pertinent PMH/PSH includes HTN, Marfans syndrome, Spinal stenosis, TIA.   Active Hospital Problems   *CHF exacerbation [facility])           Clinical picture consistent with new onset heart           failure, ECHO suggestive of diastolic heart           failure. ECHO additionally notable for small           pericardial effusion. Per exam, requiring further           inipatient diuresis.           -Redose 40mg  IV Lasix           -Consider addition of Spironolactone, and SGLT2           -Consult Cardiology for effusion           -AM BMP (keep K above 4) and mag (keep above 2)           -Continuous cardiac monitoring           -O2 as needed for sats above 90%           -Strict I/O's           -Daily weights    Achilles tendon tear, left, subsequent encounter           Patient previously report ACL tear, on           clarification today patient has had previous           Achilles tendon tear, evaluated at [facility].           Per patient conservative care initiated at that           time. Able to ambulate with walker, currently           wearing orthopedic shoe.    HTN (hypertension)           BP intermittently mildly elevated. Watch BP with           IV Lasix administration.           -Continue home losartan and metoprolol   Resolved Hospital Problems No resolved problems to display.    FEN/GI: Heart healthy, carb modified PPx: Lovenox Dispo:Pending clinical improvement  Subjective:  No acute event since admission. Patient feels she is doing better. Most concerned about  bilateral leg cramps.   Objective: Temp:  [97.3 F (36.3 C)-98.7 F (37.1 C)] 98.3 F (36.8 C) (06/04 1459) Pulse Rate:  [62-93] 86 (06/04 1459) Resp:  [10-29] 21 (06/04 1459) BP: (97-154)/(47-77) 117/55 (06/04 1459) SpO2:  [90 %-96 %] 95 % (06/04 1459) Weight:  [93 kg] 93 kg (06/04 1459) Physical Exam: General: NAD Cardiovascular: RRR, no murmurs, 2+ right sided pitting edema RLE, 1+ left side pitting edema LLE,  Respiratory: normal WOB on RA, bibasilar crackles Abdomen: soft, NTTP, no rebound or guarding Extremities: Moving all 4 extremities equally   Laboratory: Most recent CBC Lab Results  Component Value Date   WBC 8.9 11/11/2022   HGB 13.3 11/11/2022  HCT 39.0 11/11/2022   MCV 92.1 11/11/2022   PLT 215 11/11/2022   Most recent BMP    Latest Ref Rng & Units 11/12/2022    1:21 AM  BMP  Glucose 70 - 99 mg/dL 981   BUN 8 - 23 mg/dL 10   Creatinine 1.91 - 1.00 mg/dL 4.78   Sodium 295 - 621 mmol/L 140   Potassium 3.5 - 5.1 mmol/L 3.9   Chloride 98 - 111 mmol/L 101   CO2 22 - 32 mmol/L 28   Calcium 8.9 - 10.3 mg/dL 30.8     Mag 1.9  Imaging/Diagnostic Tests:  ECHO  1. Left ventricular ejection fraction, by estimation, is 55 to 60%. The  left ventricle has normal function. The left ventricle has no regional  wall motion abnormalities. There is mild concentric left ventricular  hypertrophy. Left ventricular diastolic  parameters are consistent with Grade I diastolic dysfunction (impaired  relaxation).   2. Right ventricular systolic function is normal. The right ventricular  size is normal.   3. The mitral valve is grossly normal. No evidence of mitral valve  regurgitation. No evidence of mitral stenosis.   4. The aortic valve is normal in structure. Aortic valve regurgitation is  trivial. No aortic stenosis is present.   5. Moderately dilated pulmonary artery.   6. There is a small-moderate sized circumferential pericardial effusion.  There is some  tricuspid and mitral inflow variation but otherwise no signs  of cardiac tamponade (No diastolic RA/RV collapse, IVC 1.7cm).   Celine Mans, MD 11/12/2022, 3:34 PM  PGY-1, [facility] FPTS Intern pager: [phone], text pages welcome Secure chat group [facility] Teaching Service

## 2022-11-12 NOTE — Hospital Course (Signed)
Leslie Dean is a 77 y.o.female with a history of HTN, Marfan syndrome, spinal stenosis, osteoporosis who was admitted to the [facility] Medicine Teaching Service at [facility] for new onset heart failure. Her hospital course is detailed below:  New onset acute heart failure Presented with dyspnea and increased peripheral edema. CXR showed pulmonary vascular congestion, cardiomegaly, interstitial edema. BNP elevated to 855, troponins flat.  CTPA negative for PE but suggestive of pulmonary hypertension. Echo suggestive of diastolic heart failure with notable small pericardial effusion. She was provided IV lasix for diuresis.  Cardiology was consulted and patient was established with HF clinic. Ultimately, patient's Lasix was increased to 40mg  daily. GDMT of losartan and metoprolol was continued. Advised to avoid SGLT2i given hx of recurrent UTIs.  She was discharged with euvolemic exam.  Left Achilles tendon tear This was evaluated prior at [facility].  Patient is able to ambulate with a walker while wearing orthopedic shoe.  Other chronic conditions were medically managed with home medications and formulary alternatives as necessary (HTN, Marfans syndrome, Spinal stenosis, TIA)  PCP Follow-up Recommendations: Consider outpatient sleep study given evidence of pulmonary arterial HTN on echo. Consider physical therapy referral for left Achilles tendon tear.

## 2022-11-12 NOTE — ED Notes (Signed)
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 Yes   Triage Complete: Triage complete  Chief Complaint CHF exacerbation (HCC) [I50.9]  Triage Note Pt to ED via POV with c/o SOB that started one week ago and has gotten worse today. Pt stated she has mid sternal chest pain and nausea. Pt states she take lasix and has been urinating frequently. Pt Aox4 in triage, Lower extremity swelling noted +2 pitting edema.    Allergies Allergies  Allergen Reactions   Albuterol Sulfate    Hydromorphone Hcl    Other     Says narcotics cause nausea for her    Level of Care/Admitting Diagnosis ED Disposition     ED Disposition  Admit   Condition  --   Comment  Hospital Area: [facility] [[id number]]  Level of Care: Telemetry Medical [104]  May admit patient to [facility] or [name] if equivalent level of care is available:: Yes  Covid Evaluation: Confirmed COVID Negative  Diagnosis: CHF exacerbation [facility]) [[id number]]  Admitting Physician: Evette Georges [[id number]]  Attending Physician: Billey Co [[id number]]  Certification:: I certify this patient will need inpatient services for at least 2 midnights  Estimated Length of Stay: 2          B Medical/Surgery History Past Medical History:  Diagnosis Date   Hepatic cyst 12/03/2012   Hypertension    Scoliosis    Spinal stenosis    Stroke (HCC)    03/24/2020   Past Surgical History:  Procedure Laterality Date   CESAREAN SECTION     CHOLECYSTECTOMY N/A 12/04/2012   Procedure: LAPAROSCOPIC Liver Cyst Unroofing and removal of gallbladder;  Surgeon: Almond Lint, MD;  Location: [facility];  Service: General;  Laterality: N/A;   CYST EXCISION     liver cyst removal   EYE SURGERY     FEMUR IM  NAIL Left 10/18/2019   Procedure: INTRAMEDULLARY (IM) NAIL FEMORAL;  Surgeon: Durene Romans, MD;  Location: [facility];  Service: Orthopedics;  Laterality: Left;     A IV Location/Drains/Wounds Patient Lines/Drains/Airways Status     Active Line/Drains/Airways     Name Placement date Placement time Site Days   Peripheral IV 11/11/22 20 G Right Antecubital 11/11/22  1009  Antecubital  1   Pressure Injury 10/28/19 Heel Left Stage 1 -  Intact skin with non-blanchable redness of a localized area usually over a bony prominence. 10/28/19  0700  -- 1111            Intake/Output Last 24 hours  Intake/Output Summary (Last 24 hours) at 11/12/2022 1336 Last data filed at 11/12/2022 1105 Gross per 24 hour  Intake --  Output 200 ml  Net -200 ml    Labs/Imaging Results for orders placed or performed during the hospital encounter of 11/11/22 (from the past 48 hour(s))  CBC     Status: Abnormal   Collection Time: 11/11/22 10:08 AM  Result Value Ref Range   WBC 8.9 4.0 - 10.5 K/uL   RBC 4.70 3.87 - 5.11 MIL/uL   Hemoglobin 12.9 12.0 - 15.0 g/dL   HCT 29.5 62.1 - 30.8 %   MCV 92.1 80.0 - 100.0 fL   MCH 27.4 26.0 - 34.0 pg   MCHC 29.8 (L)  30.0 - 36.0 g/dL   RDW 34.7 42.5 - 95.6 %   Platelets 215 150 - 400 K/uL   nRBC 0.0 0.0 - 0.2 %    Comment: Performed at [facility], [address] [place], [place] [address]  Comprehensive metabolic panel     Status: Abnormal   Collection Time: 11/11/22 10:08 AM  Result Value Ref Range   Sodium 141 135 - 145 mmol/L   Potassium 4.2 3.5 - 5.1 mmol/L   Chloride 102 98 - 111 mmol/L   CO2 29 22 - 32 mmol/L   Glucose, Bld 107 (H) 70 - 99 mg/dL    Comment: Glucose reference range applies only to samples taken after fasting for at least 8 hours.   BUN 11 8 - 23 mg/dL   Creatinine, Ser 4.33 0.44 - 1.00 mg/dL   Calcium 29.5 8.9 - 18.8 mg/dL   Total Protein 6.3 (L) 6.5 - 8.1 g/dL   Albumin 3.3 (L) 3.5 - 5.0 g/dL   AST 15 15 - 41 U/L   ALT 14 0 - 44  U/L   Alkaline Phosphatase 50 38 - 126 U/L   Total Bilirubin 1.2 0.3 - 1.2 mg/dL   GFR, Estimated >41 >66 mL/min    Comment: (NOTE) Calculated using the CKD-EPI Creatinine Equation (2021)    Anion gap 10 5 - 15    Comment: Performed at [facility], [address] [place], [place] [address]  Brain natriuretic peptide     Status: Abnormal   Collection Time: 11/11/22 10:08 AM  Result Value Ref Range   B Natriuretic Peptide 855.3 (H) 0.0 - 100.0 pg/mL    Comment: Performed at [facility], [address] [place], [place] [address]  D-dimer, quantitative     Status: Abnormal   Collection Time: 11/11/22 10:08 AM  Result Value Ref Range   D-Dimer, Quant 0.80 (H) 0.00 - 0.50 ug/mL-FEU    Comment: (NOTE) At the manufacturer cut-off value of 0.5 g/mL FEU, this assay has a negative predictive value of 95-100%.This assay is intended for use in conjunction with a clinical pretest probability (PTP) assessment model to exclude pulmonary embolism (PE) and deep venous thrombosis (DVT) in outpatients suspected of PE or DVT. Results should be correlated with clinical presentation. Performed at [facility], [address] [place], [place] [address]   Troponin I (High Sensitivity)     Status: None   Collection Time: 11/11/22 10:08 AM  Result Value Ref Range   Troponin I (High Sensitivity) 10 <18 ng/L    Comment: (NOTE) Elevated high sensitivity troponin I (hsTnI) values and significant  changes across serial measurements may suggest ACS but many other  chronic and acute conditions are known to elevate hsTnI results.  Refer to the "Links" section for chest pain algorithms and additional  guidance. Performed at [facility], [address] [place], [place] [address]   SARS Coronavirus 2 by RT PCR (hospital order, performed in [facility] hospital lab) *cepheid single result test* Anterior Nasal Swab     Status: None   Collection Time: 11/11/22 10:41 AM   Specimen:  Anterior Nasal Swab  Result Value Ref Range   SARS Coronavirus 2 by RT PCR NEGATIVE NEGATIVE    Comment: Performed at [facility], [address], [place], [place] [address]  Lactic acid, plasma     Status: None   Collection Time: 11/11/22 11:22 AM  Result Value Ref  Range   Lactic Acid, Venous 1.1 0.5 - 1.9 mmol/L    Comment: Performed at [facility], [address] [place], [place] [address]  Troponin I (High Sensitivity)     Status: None   Collection Time: 11/11/22 11:22 AM  Result Value Ref Range   Troponin I (High Sensitivity) 9 <18 ng/L    Comment: (NOTE) Elevated high sensitivity troponin I (hsTnI) values and significant  changes across serial measurements may suggest ACS but many other  chronic and acute conditions are known to elevate hsTnI results.  Refer to the "Links" section for chest pain algorithms and additional  guidance. Performed at [facility], [address] [place], [place] [address]   I-Stat venous blood gas, ED     Status: Abnormal   Collection Time: 11/11/22  2:34 PM  Result Value Ref Range   pH, Ven 7.400 7.25 - 7.43   pCO2, Ven 53.4 44 - 60 mmHg   pO2, Ven 78 (H) 32 - 45 mmHg   Bicarbonate 33.0 (H) 20.0 - 28.0 mmol/L   TCO2 35 (H) 22 - 32 mmol/L   O2 Saturation 95 %   Acid-Base Excess 7.0 (H) 0.0 - 2.0 mmol/L   Sodium 141 135 - 145 mmol/L   Potassium 4.6 3.5 - 5.1 mmol/L   Calcium, Ion 1.25 1.15 - 1.40 mmol/L   HCT 39.0 36.0 - 46.0 %   Hemoglobin 13.3 12.0 - 15.0 g/dL   Sample type VENOUS   Blood gas, venous (at [facility] and [facility])     Status: Abnormal   Collection Time: 11/12/22  1:21 AM  Result Value Ref Range   pH, Ven 7.5 (H) 7.25 - 7.43   pCO2, Ven 40 (L) 44 - 60 mmHg   pO2, Ven 56 (H) 32 - 45 mmHg   Bicarbonate 31.2 (H) 20.0 - 28.0 mmol/L   Acid-Base Excess 7.4 (H) 0.0 - 2.0 mmol/L   O2 Saturation 90.8 %   Patient temperature 37.0     Comment: Performed at [facility], [address], [place], [place] [address]   Magnesium     Status: None   Collection Time: 11/12/22  1:21 AM  Result Value Ref Range   Magnesium 1.9 1.7 - 2.4 mg/dL    Comment: Performed at [facility], [address] [place], [place] [address]  Basic metabolic panel     Status: Abnormal   Collection Time: 11/12/22  1:21 AM  Result Value Ref Range   Sodium 140 135 - 145 mmol/L   Potassium 3.9 3.5 - 5.1 mmol/L   Chloride 101 98 - 111 mmol/L   CO2 28 22 - 32 mmol/L   Glucose, Bld 101 (H) 70 - 99 mg/dL    Comment: Glucose reference range applies only to samples taken after fasting for at least 8 hours.   BUN 10 8 - 23 mg/dL   Creatinine, Ser 6.21 0.44 - 1.00 mg/dL   Calcium 30.8 8.9 - 65.7 mg/dL   GFR, Estimated >84 >69 mL/min    Comment: (NOTE) Calculated using the CKD-EPI Creatinine Equation (2021)    Anion gap 11 5 - 15    Comment: Performed at [facility], [address], [place], [place] [address]   ECHOCARDIOGRAM COMPLETE  Result Date: 11/12/2022    ECHOCARDIOGRAM REPORT   Patient Name:   ALFRIEDA LUI Date of Exam: 11/12/2022 Medical Rec #:  [id number]       Height:  66.0 in Accession #:    [id number]      Weight:       200.0 lb Date of Birth:  [birth year]      BSA:          2.000 m Patient Age:    76 years        BP:           110/62 mmHg Patient Gender: F               HR:           80 bpm. Exam Location:  Inpatient Procedure: 2D Echo, Cardiac Doppler and Color Doppler Indications:    Congestive Heart Failure  History:        Patient has prior history of Echocardiogram examinations, most                 recent 04/06/2020. CHF, TIA and Marfan's, spinal stenosis,                 Signs/Symptoms:Edema and Shortness of Breath; Risk                 Factors:Hypertension and Former Smoker.  Sonographer:    Wallie Char Referring Phys: [id number] MARGARET E PRAY  Sonographer Comments: Technically difficult study due to poor echo windows. Image acquisition challenging due to respiratory motion. IMPRESSIONS  1. Left  ventricular ejection fraction, by estimation, is 55 to 60%. The left ventricle has normal function. The left ventricle has no regional wall motion abnormalities. There is mild concentric left ventricular hypertrophy. Left ventricular diastolic parameters are consistent with Grade I diastolic dysfunction (impaired relaxation).  2. Right ventricular systolic function is normal. The right ventricular size is normal.  3. The mitral valve is grossly normal. No evidence of mitral valve regurgitation. No evidence of mitral stenosis.  4. The aortic valve is normal in structure. Aortic valve regurgitation is trivial. No aortic stenosis is present.  5. Moderately dilated pulmonary artery.  6. There is a small-moderate sized circumferential pericardial effusion. There is some tricuspid and mitral inflow variation but otherwise no signs of cardiac tamponade (No diastolic RA/RV collapse, IVC 1.7cm). FINDINGS  Left Ventricle: Left ventricular ejection fraction, by estimation, is 55 to 60%. The left ventricle has normal function. The left ventricle has no regional wall motion abnormalities. The left ventricular internal cavity size was normal in size. There is  mild concentric left ventricular hypertrophy. Left ventricular diastolic parameters are consistent with Grade I diastolic dysfunction (impaired relaxation). Right Ventricle: The right ventricular size is normal. No increase in right ventricular wall thickness. Right ventricular systolic function is normal. Left Atrium: Left atrial size was normal in size. Right Atrium: Right atrial size was normal in size. Pericardium: A small pericardial effusion is present. The pericardial effusion is circumferential. The pericardial effusion appears to contain fibrous material. Mitral Valve: The mitral valve is grossly normal. Mild mitral annular calcification. No evidence of mitral valve regurgitation. No evidence of mitral valve stenosis. MV peak gradient, 2.4 mmHg. The mean mitral  valve gradient is 1.0 mmHg. Tricuspid Valve: The tricuspid valve is normal in structure. Tricuspid valve regurgitation is not demonstrated. No evidence of tricuspid stenosis. Aortic Valve: The aortic valve is normal in structure. Aortic valve regurgitation is trivial. Aortic regurgitation PHT measures 526 msec. No aortic stenosis is present. Aortic valve mean gradient measures 4.0 mmHg. Aortic valve peak gradient measures 7.4  mmHg. Aortic valve area, by VTI measures 3.01 cm. Pulmonic Valve: The  pulmonic valve was normal in structure. Pulmonic valve regurgitation is not visualized. No evidence of pulmonic stenosis. Aorta: The aortic root is normal in size and structure. Pulmonary Artery: The pulmonary artery is moderately dilated. Venous: The inferior vena cava is normal in size with greater than 50% respiratory variability, suggesting right atrial pressure of 3 mmHg. IAS/Shunts: No atrial level shunt detected by color flow Doppler.  LEFT VENTRICLE PLAX 2D LVIDd:         4.10 cm     Diastology LVIDs:         3.20 cm     LV e' medial:    3.71 cm/s LV PW:         1.30 cm     LV E/e' medial:  14.7 LV IVS:        1.30 cm     LV e' lateral:   5.26 cm/s LVOT diam:     2.00 cm     LV E/e' lateral: 10.3 LV SV:         76 LV SV Index:   38 LVOT Area:     3.14 cm  LV Volumes (MOD) LV vol d, MOD A2C: 73.6 ml LV vol d, MOD A4C: 87.0 ml LV vol s, MOD A2C: 26.2 ml LV vol s, MOD A4C: 36.8 ml LV SV MOD A2C:     47.4 ml LV SV MOD A4C:     87.0 ml LV SV MOD BP:      47.7 ml RIGHT VENTRICLE         IVC TAPSE (M-mode): 1.9 cm  IVC diam: 1.70 cm LEFT ATRIUM             Index        RIGHT ATRIUM           Index LA diam:        4.00 cm 2.00 cm/m   RA Area:     16.60 cm LA Vol (A2C):   65.6 ml 32.80 ml/m  RA Volume:   48.30 ml  24.15 ml/m LA Vol (A4C):   64.8 ml 32.40 ml/m LA Biplane Vol: 67.3 ml 33.65 ml/m  AORTIC VALVE AV Area (Vmax):    3.05 cm AV Area (Vmean):   2.87 cm AV Area (VTI):     3.01 cm AV Vmax:           136.00  cm/s AV Vmean:          93.750 cm/s AV VTI:            0.254 m AV Peak Grad:      7.4 mmHg AV Mean Grad:      4.0 mmHg LVOT Vmax:         132.00 cm/s LVOT Vmean:        85.600 cm/s LVOT VTI:          0.243 m LVOT/AV VTI ratio: 0.96 AI PHT:            526 msec  AORTA Ao Root diam: 4.40 cm Ao Asc diam:  3.90 cm MITRAL VALVE               TRICUSPID VALVE MV Area (PHT): 2.17 cm    TR Peak grad:   39.7 mmHg MV Area VTI:   3.30 cm    TR Vmax:        315.00 cm/s MV Peak grad:  2.4 mmHg MV Mean grad:  1.0 mmHg    SHUNTS MV  Vmax:       0.77 m/s    Systemic VTI:  0.24 m MV Vmean:      46.7 cm/s   Systemic Diam: 2.00 cm MV Decel Time: 350 msec MV E velocity: 54.40 cm/s MV A velocity: 79.90 cm/s MV E/A ratio:  0.68 Aditya Sabharwal Electronically signed by Dorthula Nettles Signature Date/Time: 11/12/2022/10:25:45 AM    Final    CT Angio Chest PE W and/or Wo Contrast  Result Date: 11/11/2022 CLINICAL DATA:  Pulmonary embolus suspected with high probability. EXAM: CT ANGIOGRAPHY CHEST WITH CONTRAST TECHNIQUE: Multidetector CT imaging of the chest was performed using the standard protocol during bolus administration of intravenous contrast. Multiplanar CT image reconstructions and MIPs were obtained to evaluate the vascular anatomy. RADIATION DOSE REDUCTION: This exam was performed according to the departmental dose-optimization program which includes automated exposure control, adjustment of the mA and/or kV according to patient size and/or use of iterative reconstruction technique. CONTRAST:  75mL OMNIPAQUE IOHEXOL 350 MG/ML SOLN COMPARISON:  11/29/2012 FINDINGS: Cardiovascular: Technically adequate study with good opacification of the central and segmental pulmonary arteries. No focal filling defects. No evidence of significant pulmonary embolus. Main pulmonary arteries are dilated, suggesting possibility of pulmonary arterial hypertension. Heart size is normal. There is a small pericardial effusion. Normal caliber thoracic  aorta. Calcification of the aorta and coronary arteries. Mediastinum/Nodes: Diffuse nodular enlargement of the thyroid gland is similar to prior study consistent with multinodular goiter. No imaging follow-up is indicated. Scattered lymph nodes are prominent, with largest left aortopulmonic window node measuring 11 mm diameter. Appearances are nonspecific but likely to be reactive. Esophagus is decompressed. Lungs/Pleura: Dependent atelectasis in the posterior lungs. Mild perihilar infiltration on the left may indicate early edema or pneumonia. Upper Abdomen: Large cyst, 11.8 cm, in the liver with thin calcifications in the wall. Cyst is decreased in size since previous study. Appearance suggests a giant hepatic cyst versus biliary cystadenoma. Additional smaller cysts are present. No acute process identified. Musculoskeletal: Degenerative changes in the spine. Mild compression of 2 midthoracic vertebrae. This is likely chronic as no acute cortical changes are identified. Review of the MIP images confirms the above findings. IMPRESSION: 1. No evidence of significant pulmonary embolus. 2. Dilated central pulmonary arteries suggesting possibility of pulmonary arterial hypertension. 3. Diffuse nodular enlargement of the thyroid gland, unchanged since prior study. Stability for greater than 5 years implies benignity; no biopsy or followup indicated (ref: J Am Coll Radiol. 2015 Feb;12(2): 143-50). 4. Mild perihilar infiltration in the left lung may indicate early edema or pneumonia. 5. Large cysts in the liver may indicate giant hepatic cyst versus biliary cystadenoma. This is smaller than on previous study. 6. Mild compression of 2 midthoracic vertebra, likely chronic. Electronically Signed   By: Burman Nieves M.D.   On: 11/11/2022 19:45   VAS [place] LOWER EXTREMITY VENOUS (DVT)  Result Date: 11/11/2022  Lower Venous DVT Study Patient Name:  ASHLYNNE MESKER  Date of Exam:   11/11/2022 Medical Rec #: [id number]         Accession #:    [id number] Date of Birth: Aug 26, 1945       Patient Gender: F Patient Age:   80 years Exam Location:  [facility] Procedure:      VAS [place] LOWER EXTREMITY VENOUS (DVT) Referring Phys: Duwayne Heck RAY --------------------------------------------------------------------------------  Indications: Swelling.  Limitations: Patient could not tolerate compressions. Comparison Study: No prior study on file Performing Technologist: Sherren Kerns RVS  Examination Guidelines: A complete evaluation  includes B-mode imaging, spectral Doppler, color Doppler, and power Doppler as needed of all accessible portions of each vessel. Bilateral testing is considered an integral part of a complete examination. Limited examinations for reoccurring indications may be performed as noted. The reflux portion of the exam is performed with the patient in reverse Trendelenburg.  +---------+---------------+---------+-----------+----------+-------------------+ RIGHT    CompressibilityPhasicitySpontaneityPropertiesThrombus Aging      +---------+---------------+---------+-----------+----------+-------------------+ CFV      Full           Yes      Yes                                      +---------+---------------+---------+-----------+----------+-------------------+ SFJ      Full                                                             +---------+---------------+---------+-----------+----------+-------------------+ FV Prox                 Yes      Yes                  patent by color and                                                       Doppler             +---------+---------------+---------+-----------+----------+-------------------+ FV Mid                  Yes      Yes                  patent by color and                                                       Doppler             +---------+---------------+---------+-----------+----------+-------------------+ FV Distal                Yes      Yes                  patent by color and                                                       Doppler             +---------+---------------+---------+-----------+----------+-------------------+ PFV                     Yes      Yes                  patent by color and  Doppler             +---------+---------------+---------+-----------+----------+-------------------+ POP      Full           Yes      Yes                                      +---------+---------------+---------+-----------+----------+-------------------+ PTV      Full                                                             +---------+---------------+---------+-----------+----------+-------------------+ PERO     Full                                                             +---------+---------------+---------+-----------+----------+-------------------+   +---------+---------------+---------+-----------+----------+--------------+ LEFT     CompressibilityPhasicitySpontaneityPropertiesThrombus Aging +---------+---------------+---------+-----------+----------+--------------+ CFV      Full           Yes      Yes                                 +---------+---------------+---------+-----------+----------+--------------+ SFJ      Full                                                        +---------+---------------+---------+-----------+----------+--------------+ FV Prox  Full                                                        +---------+---------------+---------+-----------+----------+--------------+ FV Mid   Full           Yes      Yes                                 +---------+---------------+---------+-----------+----------+--------------+ FV DistalFull                                                        +---------+---------------+---------+-----------+----------+--------------+ PFV       Full                                                        +---------+---------------+---------+-----------+----------+--------------+ POP      Full           Yes  Yes                                 +---------+---------------+---------+-----------+----------+--------------+ PTV      Full                                                        +---------+---------------+---------+-----------+----------+--------------+ PERO     Full                                                        +---------+---------------+---------+-----------+----------+--------------+     Summary: BILATERAL: - No evidence of deep vein thrombosis seen in the lower extremities, bilaterally. -No evidence of popliteal cyst, bilaterally.   *See table(s) above for measurements and observations. Electronically signed by Lemar Livings MD on 11/11/2022 at 4:39:18 PM.    Final    DG Chest Port 1 View  Result Date: 11/11/2022 CLINICAL DATA:  Dyspnea. EXAM: PORTABLE CHEST 1 VIEW COMPARISON:  10/21/2019 FINDINGS: Improved inspiration. Operative progressive enlargement of the cardiac silhouette with mildly increased pulmonary vascular congestion and interval mild diffuse interstitial pulmonary edema. No pleural fluid. Diffuse osteopenia. Atheromatous aortic arch calcifications. IMPRESSION: Mild acute congestive heart failure with mildly progressive cardiomegaly, pulmonary vascular congestion and mild interstitial pulmonary edema. Electronically Signed   By: Beckie Salts M.D.   On: 11/11/2022 11:20    Pending Labs Unresulted Labs (From admission, onward)     Start     Ordered   11/13/22 0500  Basic metabolic panel  Tomorrow morning,   R        11/12/22 1102   11/13/22 0500  Magnesium  Tomorrow morning,   R        11/12/22 1102   11/11/22 1042  Blood culture (routine x 2)  BLOOD CULTURE X 2,   R      11/11/22 1042            Vitals/Pain Today's Vitals   11/12/22 1230 11/12/22 1250 11/12/22 1300  11/12/22 1315  BP: 98/61 110/66 125/77 112/66  Pulse: 80 82 76 78  Resp:   (!) 24 19  Temp:      TempSrc:      SpO2: 94% 92% 95% 93%  Weight:      Height:      PainSc:        Isolation Precautions No active isolations  Medications Medications  enoxaparin (LOVENOX) injection 40 mg (40 mg Subcutaneous Given 11/11/22 2227)  aspirin chewable tablet 81 mg (81 mg Oral Given 11/12/22 1101)  atorvastatin (LIPITOR) tablet 40 mg (40 mg Oral Given 11/12/22 1059)  losartan (COZAAR) tablet 50 mg (0 mg Oral Hold 11/12/22 1101)  metoprolol succinate (TOPROL-XL) 24 hr tablet 50 mg (50 mg Oral Not Given 11/12/22 0955)  ferrous sulfate tablet 325 mg (325 mg Oral Given 11/12/22 0724)  pregabalin (LYRICA) capsule 75 mg (75 mg Oral Given 11/12/22 0010)  cholecalciferol (VITAMIN D3) 25 MCG (1000 UNIT) tablet 2,000 Units (2,000 Units Oral Given 11/12/22 1101)  furosemide (LASIX) injection 40 mg (40 mg Intravenous Given 11/11/22 1929)  iohexol (OMNIPAQUE) 350 MG/ML injection 75  mL (75 mLs Intravenous Contrast Given 11/11/22 1913)  furosemide (LASIX) injection 40 mg (40 mg Intravenous Given 11/12/22 1127)    Mobility walks with person assist     Focused Assessments Neuro Assessment Handoff:  Swallow screen pass? Yes          Neuro Assessment: Within Defined Limits Neuro Checks:      Has TPA been given? No If patient is a Neuro Trauma and patient is going to OR before floor call report to 4N Charge nurse: [phone] or [phone]   R Recommendations: See Admitting Provider Note  Report given to:   Additional Notes: pt is AAOx4. Pt is on room air. Pt has on purewick.

## 2022-11-12 NOTE — TOC Benefit Eligibility Note (Signed)
Patient Advocate Encounter  Insurance verification completed.    The patient is currently admitted and upon discharge could be taking Farxiga 10 mg.  The current 30 day co-pay is $47.00.   The patient is currently admitted and upon discharge could be taking Jardiance 10 mg.  The current 30 day co-pay is $47.00.   The patient is insured through [employer] Medicare Part D   This test claim was processed through [facility]- copay amounts may vary at other pharmacies due to pharmacy/plan contracts, or as the patient moves through the different stages of their insurance plan.  Leslie Dean, CPHT Pharmacy Patient Advocate Specialist  Pharmacy Patient Advocate Team Direct Number: [phone]  Fax: [phone]       

## 2022-11-12 NOTE — Evaluation (Signed)
Physical Therapy Evaluation Patient Details Name: Leslie Dean MRN: [id number] DOB: 1946-04-11 Today's Date: 11/12/2022  History of Present Illness  77 y.o. female presents to [facility] hospital on 11/11/2022 with SOB, concerning for CHF exacerbation. Pt also with recent L achilles tear. PMH includes HTN, Marfans syndrome, urinary incontinence, spinal stenosis, TIA, chronic LE edema.  Clinical Impression  Pt presents to PT with deficits in strength, power, gait, balance, endurance. Pt reports L ankle instability since injury to leg. Initially pt reports a torn ACL, however a CAM boot was provided for her L ankle and her daughter reports the injury was to the ankle and not the knee. X-ray from January 2024 reveals edema at achilles site, PT is unable to find L ankle MRI in chart. Pt will benefit from frequent mobilization during this admission in an effort to improve endurance. PT recommends outpatient PT at the time of discharge in an effort to improve balance and ankle stability.       Recommendations for follow up therapy are one component of a multi-disciplinary discharge planning process, led by the attending physician.  Recommendations may be updated based on patient status, additional functional criteria and insurance authorization.  Follow Up Recommendations       Assistance Recommended at Discharge PRN  Patient can return home with the following  A little help with bathing/dressing/bathroom;Assistance with cooking/housework;Assist for transportation;Help with stairs or ramp for entrance    Equipment Recommendations None recommended by PT  Recommendations for Other Services       Functional Status Assessment Patient has had a recent decline in their functional status and demonstrates the ability to make significant improvements in function in a reasonable and predictable amount of time.     Precautions / Restrictions Precautions Precautions: Fall Required Braces or Orthoses: Other  Brace Other Brace: CAM boot for L ankle Restrictions Weight Bearing Restrictions: No      Mobility  Bed Mobility Overal bed mobility: Needs Assistance Bed Mobility: Supine to Sit, Sit to Supine     Supine to sit: Min guard, HOB elevated Sit to supine: Min guard        Transfers Overall transfer level: Needs assistance Equipment used: Rolling walker (2 wheels) Transfers: Sit to/from Stand Sit to Stand: Min guard                Ambulation/Gait Ambulation/Gait assistance: Land (Feet): 100 Feet Assistive device: Rolling walker (2 wheels) Gait Pattern/deviations: Step-through pattern Gait velocity: reduced Gait velocity interpretation: <1.8 ft/sec, indicate of risk for recurrent falls   General Gait Details: slowed step-through gait  Stairs            Wheelchair Mobility    Modified Rankin (Stroke Patients Only)       Balance Overall balance assessment: Needs assistance Sitting-balance support: No upper extremity supported, Feet supported Sitting balance-Leahy Scale: Good     Standing balance support: Single extremity supported, Reliant on assistive device for balance Standing balance-Leahy Scale: Poor                               Pertinent Vitals/Pain Pain Assessment Pain Assessment: No/denies pain    Home Living Family/patient expects to be discharged to:: Private residence Living Arrangements: Children Available Help at Discharge: Family;Available 24 hours/day Type of Home: House Home Access: Stairs to enter   Entrance Stairs-Number of Steps: 1 Alternate Level Stairs-Number of Steps: 1 Home Layout: Multi-level Home  Equipment: Agricultural consultant (2 wheels);Rollator (4 wheels);Shower seat;Toilet riser;Wheelchair - manual      Prior Function Prior Level of Function : Needs assist             Mobility Comments: ambulatory with RW in home, rollator out of the home. Ambulation has been significantly limited  over the last month. Initial L ankle injury in January 2024       Hand Dominance        Extremity/Trunk Assessment   Upper Extremity Assessment Upper Extremity Assessment: Generalized weakness    Lower Extremity Assessment Lower Extremity Assessment: Generalized weakness;LLE deficits/detail LLE Deficits / Details: L ankle PF 3/5    Cervical / Trunk Assessment Cervical / Trunk Assessment: Kyphotic  Communication   Communication: No difficulties  Cognition Arousal/Alertness: Awake/alert Behavior During Therapy: WFL for tasks assessed/performed Overall Cognitive Status: Impaired/Different from baseline Area of Impairment:  (remote memory, pt reports injuring her ACL, appears to be an achilles injury)                                        General Comments General comments (skin integrity, edema, etc.): VSS on RA, pt reports mild DOE    Exercises     Assessment/Plan    PT Assessment Patient needs continued PT services  PT Problem List Decreased strength;Decreased activity tolerance;Decreased balance;Decreased mobility       PT Treatment Interventions DME instruction;Gait training;Functional mobility training;Therapeutic activities;Therapeutic exercise;Balance training;Neuromuscular re-education;Stair training;Patient/family education    PT Goals (Current goals can be found in the Care Plan section)  Acute Rehab PT Goals Patient Stated Goal: to improve stability and activity tolerance PT Goal Formulation: With patient/family Time For Goal Achievement: 11/26/22 Potential to Achieve Goals: Good    Frequency Min 2X/week     Co-evaluation               AM-PAC PT "6 Clicks" Mobility  Outcome Measure Help needed turning from your back to your side while in a flat bed without using bedrails?: None Help needed moving from lying on your back to sitting on the side of a flat bed without using bedrails?: A Little Help needed moving to and from a bed  to a chair (including a wheelchair)?: A Little Help needed standing up from a chair using your arms (e.g., wheelchair or bedside chair)?: A Little Help needed to walk in hospital room?: A Little Help needed climbing 3-5 steps with a railing? : A Little 6 Click Score: 19    End of Session Equipment Utilized During Treatment: Gait belt Activity Tolerance: Patient tolerated treatment well Patient left: in bed;with call bell/phone within reach;with family/visitor present Nurse Communication: Mobility status PT Visit Diagnosis: Other abnormalities of gait and mobility (R26.89);Muscle weakness (generalized) (M62.81)    Time: 3244-0102 PT Time Calculation (min) (ACUTE ONLY): 32 min   Charges:   PT Evaluation $PT Eval Low Complexity: 1 Low          Arlyss Gandy, PT, DPT Acute Rehabilitation Office [phone]   Arlyss Gandy 11/12/2022, 1:37 PM

## 2022-11-12 NOTE — ED Notes (Signed)
PT's O2 decreased to 84% on room air. Pt was placed on 2L O2 via nasal cannula with improvement.

## 2022-11-13 ENCOUNTER — Other Ambulatory Visit (HOSPITAL_COMMUNITY): Payer: Self-pay

## 2022-11-13 ENCOUNTER — Telehealth: Payer: Self-pay | Admitting: Cardiology

## 2022-11-13 DIAGNOSIS — I509 Heart failure, unspecified: Secondary | ICD-10-CM | POA: Diagnosis not present

## 2022-11-13 DIAGNOSIS — I5031 Acute diastolic (congestive) heart failure: Secondary | ICD-10-CM | POA: Diagnosis not present

## 2022-11-13 LAB — BASIC METABOLIC PANEL
Anion gap: 11 (ref 5–15)
BUN: 16 mg/dL (ref 8–23)
CO2: 28 mmol/L (ref 22–32)
Calcium: 9.5 mg/dL (ref 8.9–10.3)
Chloride: 98 mmol/L (ref 98–111)
Creatinine, Ser: 1.14 mg/dL — ABNORMAL HIGH (ref 0.44–1.00)
GFR, Estimated: 50 mL/min — ABNORMAL LOW (ref >60)
Glucose, Bld: 102 mg/dL — ABNORMAL HIGH (ref 70–99)
Potassium: 3.9 mmol/L (ref 3.5–5.1)
Sodium: 137 mmol/L (ref 135–145)

## 2022-11-13 LAB — MAGNESIUM: Magnesium: 1.9 mg/dL (ref 1.7–2.4)

## 2022-11-13 MED ORDER — ACETAMINOPHEN 325 MG PO TABS: 650.0000 mg | ORAL_TABLET | Freq: Four times a day (QID) | ORAL | Status: AC | PRN

## 2022-11-13 MED ORDER — FUROSEMIDE 40 MG PO TABS
40.0000 mg | ORAL_TABLET | Freq: Every day | ORAL | 1 refills | Status: DC
  Filled 2022-11-13: qty 30, 30d supply, fill #0

## 2022-11-13 MED ORDER — METHOCARBAMOL 500 MG PO TABS
500.0000 mg | ORAL_TABLET | Freq: Three times a day (TID) | ORAL | Status: DC | PRN
  Administered 2022-11-13: 500 mg via ORAL
  Filled 2022-11-13: qty 1

## 2022-11-13 NOTE — Plan of Care (Signed)
  Problem: Education: Goal: Knowledge of General Education information will improve Description: Including pain rating scale, medication(s)/side effects and non-pharmacologic comfort measures 11/13/2022 1810 by Reynold Bowen, RN Outcome: Adequate for Discharge 11/13/2022 1003 by Reynold Bowen, RN Outcome: Progressing   Problem: Health Behavior/Discharge Planning: Goal: Ability to manage health-related needs will improve 11/13/2022 1810 by Reynold Bowen, RN Outcome: Adequate for Discharge 11/13/2022 1003 by Reynold Bowen, RN Outcome: Progressing   Problem: Clinical Measurements: Goal: Ability to maintain clinical measurements within normal limits will improve 11/13/2022 1810 by Reynold Bowen, RN Outcome: Adequate for Discharge 11/13/2022 1003 by Reynold Bowen, RN Outcome: Progressing Goal: Will remain free from infection 11/13/2022 1810 by Reynold Bowen, RN Outcome: Adequate for Discharge 11/13/2022 1003 by Reynold Bowen, RN Outcome: Progressing Goal: Diagnostic test results will improve Outcome: Adequate for Discharge Goal: Respiratory complications will improve Outcome: Adequate for Discharge Goal: Cardiovascular complication will be avoided Outcome: Adequate for Discharge   Problem: Activity: Goal: Risk for activity intolerance will decrease Outcome: Adequate for Discharge   Problem: Nutrition: Goal: Adequate nutrition will be maintained Outcome: Adequate for Discharge   Problem: Coping: Goal: Level of anxiety will decrease Outcome: Adequate for Discharge   Problem: Elimination: Goal: Will not experience complications related to bowel motility Outcome: Adequate for Discharge Goal: Will not experience complications related to urinary retention Outcome: Adequate for Discharge   Problem: Pain Managment: Goal: General experience of comfort will improve Outcome: Adequate for Discharge   Problem: Safety: Goal:  Ability to remain free from injury will improve Outcome: Adequate for Discharge   Problem: Skin Integrity: Goal: Risk for impaired skin integrity will decrease Outcome: Adequate for Discharge

## 2022-11-13 NOTE — Plan of Care (Signed)

## 2022-11-13 NOTE — Telephone Encounter (Signed)
Patient currently admitted for new onset HFpEF, I did not see any available openings for patient to be seen in the next two weeks. Will forward to office staff to help arrange follow up. Thank you.

## 2022-11-13 NOTE — Progress Notes (Signed)
Heart Failure Navigator Progress Note  Assessed for Heart & Vascular TOC clinic readiness.  Patient does not meet criteria due to EF 55-60%, per note patient with decreased functional status. Has a [facility] scheduled follow up appointment on 11/25/2022. .   Navigator available for reassessment of patient.   Rhae Hammock, BSN, Scientist, clinical (histocompatibility and immunogenetics) Only

## 2022-11-13 NOTE — TOC CM/SW Note (Signed)
Transition of Care [facility]) - Inpatient Brief Assessment   Patient Details  Name: Leslie Dean MRN: [id number] Date of Birth: [birth year]  Transition of Care [facility]) CM/SW Contact:    Leone Haven, RN Phone Number: 11/13/2022, 1:40 PM   Clinical Narrative: From home with twin daughter, Dennie Bible and Elita Quick, she has a boot on her left foot.  Per pt eval rec outpatient physical therapy, she states she would like to do the outpatient therapy at the [facility] center. NCM sent referral thru epic for outpt pt.  She states she has walker, w/chair, at home.  She has PCP and insurance with medication coverage on file.  She did not have any HH in place pta and states she does not need HH.   Her Twin daughters are her support system one of them will transport her home today.  She states she has severe spinal stenosis, and she gets injections  for it, she states she will be going to her doctor on Friday to get an injection. She has a scale at home and weighs herself, she checks her bp daily with bp cuff.  She states she eats salt in her diet, Staff RN will give her the CHF booklet for Nutrition.     Transition of Care Asessment: Insurance and Status: Insurance coverage has been reviewed Patient has primary care physician: Yes Home environment has been reviewed: from home with two twin daughters Prior level of function:: self ambulatory, indep Prior/Current Home Services: No current home services Social Determinants of Health Reivew: SDOH reviewed no interventions necessary Readmission risk has been reviewed: Yes Transition of care needs: no transition of care needs at this time

## 2022-11-13 NOTE — Consult Note (Signed)
   [facility] Inpatient Consult   11/13/2022  AVRIL NUXOLL 1946-02-14 [id number]  [facility] [[facility]]  Accountable Care Organization [ACO] Patient: [employer]  Primary Care Provider:  Evelena Leyden, DO with [facility]  Patient screened for hospitalization with noted low risk score for unplanned readmission risk  and to assess for potential [facility]  [[facility]] Care Management service needs for post hospital transition for care coordination.  Review of patient's electronic medical record reveals patient is admitted   10:35 am Met with patient at the bedside to explain potential [facility] Care Coordinator post hospital follow up call.  Patient was accepting and given a 24 hour nurse advice line and an appointment reminder card with PCP information provided.  Patient states she will read up on it. Also, given a pamphlet from PT on Aging Gracefully. No needs expressed to this writer at this time. Patient states had been in ED since 11/11/22 and got a bed at 10 pm last evening.   Plan:  Continue to follow progress and disposition to assess for post hospital community care coordination/management needs.  Referral request for community care coordination: currently none anticipated  Of note, [facility] Care Management/Population Health does not replace or interfere with any arrangements made by the Inpatient Transition of Care team.  For questions contact:   Charlesetta Shanks, RN BSN CCM [facility]  [phone] business mobile phone Toll free office [phone]  *Concierge Line  [phone] Fax number: [phone] [email or web address]

## 2022-11-13 NOTE — Progress Notes (Signed)
Discharge instructions given with IV and tele removed Daughter Pulling car around now will pick up Meds in the pharmacy on the way out.

## 2022-11-13 NOTE — Progress Notes (Signed)
Patient complains of intermittent muscle cramps and aches. MD notified. Order received for PRN Robaxin.

## 2022-11-13 NOTE — Progress Notes (Addendum)
Rounding Note    Patient Name: Leslie Dean Date of Encounter: 11/13/2022  [facility] Cardiologist: Kristeen Miss, MD   Subjective   Patient feeling good today without any complaints.  No resting shortness of breath, chest pain, peripheral edema.  Inpatient Medications    Scheduled Meds:  aspirin  81 mg Oral Daily   atorvastatin  40 mg Oral Daily   cholecalciferol  2,000 Units Oral Daily   enoxaparin (LOVENOX) injection  40 mg Subcutaneous Q24H   ferrous sulfate  325 mg Oral Q breakfast   lidocaine  2 patch Transdermal Q24H   losartan  50 mg Oral Daily   metoprolol succinate  50 mg Oral Daily   pregabalin  75 mg Oral QHS   Continuous Infusions:  PRN Meds: acetaminophen, methocarbamol   Vital Signs    Vitals:   11/13/22 0027 11/13/22 0448 11/13/22 0718 11/13/22 0828  BP: (!) 111/56 123/62 124/63   Pulse: 81 80 81   Resp: 20 20 20    Temp: 98.4 F (36.9 C) 97.7 F (36.5 C) 98 F (36.7 C)   TempSrc: Oral Other (Comment) Oral   SpO2: 93% 99% 97% 95%  Weight:  90.3 kg    Height:        Intake/Output Summary (Last 24 hours) at 11/13/2022 0954 Last data filed at 11/13/2022 1610 Gross per 24 hour  Intake 240 ml  Output 1250 ml  Net -1010 ml      11/13/2022    4:48 AM 11/12/2022    2:59 PM 11/11/2022   10:05 AM  Last 3 Weights  Weight (lbs) 199 lb 1.2 oz 205 lb 0.4 oz 200 lb  Weight (kg) 90.3 kg 93 kg 90.719 kg      Telemetry    Normal sinus rhythm heart rates 80s to 90s- Personally Reviewed  ECG    No new tracings- Personally Reviewed  Physical Exam   GEN: No acute distress.   Neck: No JVD Cardiac: RRR, no murmurs, rubs, or gallops.  Respiratory: Clear to auscultation bilaterally. Slight crackles with no respiratory complaints.  GI: Soft, nontender, non-distended  MS: No edema; No deformity. Neuro:  Nonfocal  Psych: Normal affect   Labs    High Sensitivity Troponin:   Recent Labs  Lab 11/11/22 1008 11/11/22 1122  TROPONINIHS 10 9      Chemistry Recent Labs  Lab 11/11/22 1008 11/11/22 1434 11/12/22 0121 11/13/22 0111  NA 141 141 140 137  K 4.2 4.6 3.9 3.9  CL 102  --  101 98  CO2 29  --  28 28  GLUCOSE 107*  --  101* 102*  BUN 11  --  10 16  CREATININE 0.94  --  0.97 1.14*  CALCIUM 10.0  --  10.0 9.5  MG  --   --  1.9 1.9  PROT 6.3*  --   --   --   ALBUMIN 3.3*  --   --   --   AST 15  --   --   --   ALT 14  --   --   --   ALKPHOS 50  --   --   --   BILITOT 1.2  --   --   --   GFRNONAA >60  --  >60 50*  ANIONGAP 10  --  11 11    Lipids No results for input(s): "CHOL", "TRIG", "HDL", "LABVLDL", "LDLCALC", "CHOLHDL" in the last 168 hours.  Hematology Recent Labs  Lab 11/11/22 1008 11/11/22 1434  WBC 8.9  --   RBC 4.70  --   HGB 12.9 13.3  HCT 43.3 39.0  MCV 92.1  --   MCH 27.4  --   MCHC 29.8*  --   RDW 15.2  --   PLT 215  --    Thyroid No results for input(s): "TSH", "FREET4" in the last 168 hours.  BNP Recent Labs  Lab 11/11/22 1008  BNP 855.3*    DDimer  Recent Labs  Lab 11/11/22 1008  DDIMER 0.80*     Radiology    ECHOCARDIOGRAM COMPLETE  Result Date: 11/12/2022    ECHOCARDIOGRAM REPORT   Patient Name:   FAITHE HILDRETH Date of Exam: 11/12/2022 Medical Rec #:  [id number]       Height:       66.0 in Accession #:    [id number]      Weight:       200.0 lb Date of Birth:  Mar 18, 1946      BSA:          2.000 m Patient Age:    76 years        BP:           110/62 mmHg Patient Gender: F               HR:           80 bpm. Exam Location:  Inpatient Procedure: 2D Echo, Cardiac Doppler and Color Doppler Indications:    Congestive Heart Failure  History:        Patient has prior history of Echocardiogram examinations, most                 recent 04/06/2020. CHF, TIA and Marfan's, spinal stenosis,                 Signs/Symptoms:Edema and Shortness of Breath; Risk                 Factors:Hypertension and Former Smoker.  Sonographer:    Wallie Char Referring Phys: [id number] MARGARET E PRAY   Sonographer Comments: Technically difficult study due to poor echo windows. Image acquisition challenging due to respiratory motion. IMPRESSIONS  1. Left ventricular ejection fraction, by estimation, is 55 to 60%. The left ventricle has normal function. The left ventricle has no regional wall motion abnormalities. There is mild concentric left ventricular hypertrophy. Left ventricular diastolic parameters are consistent with Grade I diastolic dysfunction (impaired relaxation).  2. Right ventricular systolic function is normal. The right ventricular size is normal.  3. The mitral valve is grossly normal. No evidence of mitral valve regurgitation. No evidence of mitral stenosis.  4. The aortic valve is normal in structure. Aortic valve regurgitation is trivial. No aortic stenosis is present.  5. Moderately dilated pulmonary artery.  6. There is a small-moderate sized circumferential pericardial effusion. There is some tricuspid and mitral inflow variation but otherwise no signs of cardiac tamponade (No diastolic RA/RV collapse, IVC 1.7cm). FINDINGS  Left Ventricle: Left ventricular ejection fraction, by estimation, is 55 to 60%. The left ventricle has normal function. The left ventricle has no regional wall motion abnormalities. The left ventricular internal cavity size was normal in size. There is  mild concentric left ventricular hypertrophy. Left ventricular diastolic parameters are consistent with Grade I diastolic dysfunction (impaired relaxation). Right Ventricle: The right ventricular size is normal. No increase in right ventricular wall thickness. Right ventricular systolic function is normal. Left Atrium: Left  atrial size was normal in size. Right Atrium: Right atrial size was normal in size. Pericardium: A small pericardial effusion is present. The pericardial effusion is circumferential. The pericardial effusion appears to contain fibrous material. Mitral Valve: The mitral valve is grossly normal. Mild  mitral annular calcification. No evidence of mitral valve regurgitation. No evidence of mitral valve stenosis. MV peak gradient, 2.4 mmHg. The mean mitral valve gradient is 1.0 mmHg. Tricuspid Valve: The tricuspid valve is normal in structure. Tricuspid valve regurgitation is not demonstrated. No evidence of tricuspid stenosis. Aortic Valve: The aortic valve is normal in structure. Aortic valve regurgitation is trivial. Aortic regurgitation PHT measures 526 msec. No aortic stenosis is present. Aortic valve mean gradient measures 4.0 mmHg. Aortic valve peak gradient measures 7.4  mmHg. Aortic valve area, by VTI measures 3.01 cm. Pulmonic Valve: The pulmonic valve was normal in structure. Pulmonic valve regurgitation is not visualized. No evidence of pulmonic stenosis. Aorta: The aortic root is normal in size and structure. Pulmonary Artery: The pulmonary artery is moderately dilated. Venous: The inferior vena cava is normal in size with greater than 50% respiratory variability, suggesting right atrial pressure of 3 mmHg. IAS/Shunts: No atrial level shunt detected by color flow Doppler.  LEFT VENTRICLE PLAX 2D LVIDd:         4.10 cm     Diastology LVIDs:         3.20 cm     LV e' medial:    3.71 cm/s LV PW:         1.30 cm     LV E/e' medial:  14.7 LV IVS:        1.30 cm     LV e' lateral:   5.26 cm/s LVOT diam:     2.00 cm     LV E/e' lateral: 10.3 LV SV:         76 LV SV Index:   38 LVOT Area:     3.14 cm  LV Volumes (MOD) LV vol d, MOD A2C: 73.6 ml LV vol d, MOD A4C: 87.0 ml LV vol s, MOD A2C: 26.2 ml LV vol s, MOD A4C: 36.8 ml LV SV MOD A2C:     47.4 ml LV SV MOD A4C:     87.0 ml LV SV MOD BP:      47.7 ml RIGHT VENTRICLE         IVC TAPSE (M-mode): 1.9 cm  IVC diam: 1.70 cm LEFT ATRIUM             Index        RIGHT ATRIUM           Index LA diam:        4.00 cm 2.00 cm/m   RA Area:     16.60 cm LA Vol (A2C):   65.6 ml 32.80 ml/m  RA Volume:   48.30 ml  24.15 ml/m LA Vol (A4C):   64.8 ml 32.40 ml/m LA  Biplane Vol: 67.3 ml 33.65 ml/m  AORTIC VALVE AV Area (Vmax):    3.05 cm AV Area (Vmean):   2.87 cm AV Area (VTI):     3.01 cm AV Vmax:           136.00 cm/s AV Vmean:          93.750 cm/s AV VTI:            0.254 m AV Peak Grad:      7.4 mmHg AV Mean Grad:  4.0 mmHg LVOT Vmax:         132.00 cm/s LVOT Vmean:        85.600 cm/s LVOT VTI:          0.243 m LVOT/AV VTI ratio: 0.96 AI PHT:            526 msec  AORTA Ao Root diam: 4.40 cm Ao Asc diam:  3.90 cm MITRAL VALVE               TRICUSPID VALVE MV Area (PHT): 2.17 cm    TR Peak grad:   39.7 mmHg MV Area VTI:   3.30 cm    TR Vmax:        315.00 cm/s MV Peak grad:  2.4 mmHg MV Mean grad:  1.0 mmHg    SHUNTS MV Vmax:       0.77 m/s    Systemic VTI:  0.24 m MV Vmean:      46.7 cm/s   Systemic Diam: 2.00 cm MV Decel Time: 350 msec MV E velocity: 54.40 cm/s MV A velocity: 79.90 cm/s MV E/A ratio:  0.68 Aditya Sabharwal Electronically signed by Dorthula Nettles Signature Date/Time: 11/12/2022/10:25:45 AM    Final    CT Angio Chest PE W and/or Wo Contrast  Result Date: 11/11/2022 CLINICAL DATA:  Pulmonary embolus suspected with high probability. EXAM: CT ANGIOGRAPHY CHEST WITH CONTRAST TECHNIQUE: Multidetector CT imaging of the chest was performed using the standard protocol during bolus administration of intravenous contrast. Multiplanar CT image reconstructions and MIPs were obtained to evaluate the vascular anatomy. RADIATION DOSE REDUCTION: This exam was performed according to the departmental dose-optimization program which includes automated exposure control, adjustment of the mA and/or kV according to patient size and/or use of iterative reconstruction technique. CONTRAST:  75mL OMNIPAQUE IOHEXOL 350 MG/ML SOLN COMPARISON:  11/29/2012 FINDINGS: Cardiovascular: Technically adequate study with good opacification of the central and segmental pulmonary arteries. No focal filling defects. No evidence of significant pulmonary embolus. Main pulmonary arteries  are dilated, suggesting possibility of pulmonary arterial hypertension. Heart size is normal. There is a small pericardial effusion. Normal caliber thoracic aorta. Calcification of the aorta and coronary arteries. Mediastinum/Nodes: Diffuse nodular enlargement of the thyroid gland is similar to prior study consistent with multinodular goiter. No imaging follow-up is indicated. Scattered lymph nodes are prominent, with largest left aortopulmonic window node measuring 11 mm diameter. Appearances are nonspecific but likely to be reactive. Esophagus is decompressed. Lungs/Pleura: Dependent atelectasis in the posterior lungs. Mild perihilar infiltration on the left may indicate early edema or pneumonia. Upper Abdomen: Large cyst, 11.8 cm, in the liver with thin calcifications in the wall. Cyst is decreased in size since previous study. Appearance suggests a giant hepatic cyst versus biliary cystadenoma. Additional smaller cysts are present. No acute process identified. Musculoskeletal: Degenerative changes in the spine. Mild compression of 2 midthoracic vertebrae. This is likely chronic as no acute cortical changes are identified. Review of the MIP images confirms the above findings. IMPRESSION: 1. No evidence of significant pulmonary embolus. 2. Dilated central pulmonary arteries suggesting possibility of pulmonary arterial hypertension. 3. Diffuse nodular enlargement of the thyroid gland, unchanged since prior study. Stability for greater than 5 years implies benignity; no biopsy or followup indicated (ref: J Am Coll Radiol. 2015 Feb;12(2): 143-50). 4. Mild perihilar infiltration in the left lung may indicate early edema or pneumonia. 5. Large cysts in the liver may indicate giant hepatic cyst versus biliary cystadenoma. This is smaller than on previous study.  6. Mild compression of 2 midthoracic vertebra, likely chronic. Electronically Signed   By: Burman Nieves M.D.   On: 11/11/2022 19:45   VAS [place] LOWER  EXTREMITY VENOUS (DVT)  Result Date: 11/11/2022  Lower Venous DVT Study Patient Name:  SYREETA BRAUTIGAN  Date of Exam:   11/11/2022 Medical Rec #: [id number]        Accession #:    [id number] Date of Birth: 09/16/1945       Patient Gender: F Patient Age:   54 years Exam Location:  [facility] Procedure:      VAS [place] LOWER EXTREMITY VENOUS (DVT) Referring Phys: Duwayne Heck RAY --------------------------------------------------------------------------------  Indications: Swelling.  Limitations: Patient could not tolerate compressions. Comparison Study: No prior study on file Performing Technologist: Sherren Kerns RVS  Examination Guidelines: A complete evaluation includes B-mode imaging, spectral Doppler, color Doppler, and power Doppler as needed of all accessible portions of each vessel. Bilateral testing is considered an integral part of a complete examination. Limited examinations for reoccurring indications may be performed as noted. The reflux portion of the exam is performed with the patient in reverse Trendelenburg.  +---------+---------------+---------+-----------+----------+-------------------+ RIGHT    CompressibilityPhasicitySpontaneityPropertiesThrombus Aging      +---------+---------------+---------+-----------+----------+-------------------+ CFV      Full           Yes      Yes                                      +---------+---------------+---------+-----------+----------+-------------------+ SFJ      Full                                                             +---------+---------------+---------+-----------+----------+-------------------+ FV Prox                 Yes      Yes                  patent by color and                                                       Doppler             +---------+---------------+---------+-----------+----------+-------------------+ FV Mid                  Yes      Yes                  patent by color and                                                        Doppler             +---------+---------------+---------+-----------+----------+-------------------+ FV Distal               Yes      Yes  patent by color and                                                       Doppler             +---------+---------------+---------+-----------+----------+-------------------+ PFV                     Yes      Yes                  patent by color and                                                       Doppler             +---------+---------------+---------+-----------+----------+-------------------+ POP      Full           Yes      Yes                                      +---------+---------------+---------+-----------+----------+-------------------+ PTV      Full                                                             +---------+---------------+---------+-----------+----------+-------------------+ PERO     Full                                                             +---------+---------------+---------+-----------+----------+-------------------+   +---------+---------------+---------+-----------+----------+--------------+ LEFT     CompressibilityPhasicitySpontaneityPropertiesThrombus Aging +---------+---------------+---------+-----------+----------+--------------+ CFV      Full           Yes      Yes                                 +---------+---------------+---------+-----------+----------+--------------+ SFJ      Full                                                        +---------+---------------+---------+-----------+----------+--------------+ FV Prox  Full                                                        +---------+---------------+---------+-----------+----------+--------------+ FV Mid   Full           Yes      Yes                                  +---------+---------------+---------+-----------+----------+--------------+  FV DistalFull                                                        +---------+---------------+---------+-----------+----------+--------------+ PFV      Full                                                        +---------+---------------+---------+-----------+----------+--------------+ POP      Full           Yes      Yes                                 +---------+---------------+---------+-----------+----------+--------------+ PTV      Full                                                        +---------+---------------+---------+-----------+----------+--------------+ PERO     Full                                                        +---------+---------------+---------+-----------+----------+--------------+     Summary: BILATERAL: - No evidence of deep vein thrombosis seen in the lower extremities, bilaterally. -No evidence of popliteal cyst, bilaterally.   *See table(s) above for measurements and observations. Electronically signed by Lemar Livings MD on 11/11/2022 at 4:39:18 PM.    Final    DG Chest Port 1 View  Result Date: 11/11/2022 CLINICAL DATA:  Dyspnea. EXAM: PORTABLE CHEST 1 VIEW COMPARISON:  10/21/2019 FINDINGS: Improved inspiration. Operative progressive enlargement of the cardiac silhouette with mildly increased pulmonary vascular congestion and interval mild diffuse interstitial pulmonary edema. No pleural fluid. Diffuse osteopenia. Atheromatous aortic arch calcifications. IMPRESSION: Mild acute congestive heart failure with mildly progressive cardiomegaly, pulmonary vascular congestion and mild interstitial pulmonary edema. Electronically Signed   By: Beckie Salts M.D.   On: 11/11/2022 11:20    Cardiac Studies   Echocardiogram 11/12/2022  1. Left ventricular ejection fraction, by estimation, is 55 to 60%. The  left ventricle has normal function. The left ventricle has no regional   wall motion abnormalities. There is mild concentric left ventricular  hypertrophy. Left ventricular diastolic  parameters are consistent with Grade I diastolic dysfunction (impaired  relaxation).   2. Right ventricular systolic function is normal. The right ventricular  size is normal.   3. The mitral valve is grossly normal. No evidence of mitral valve  regurgitation. No evidence of mitral stenosis.   4. The aortic valve is normal in structure. Aortic valve regurgitation is  trivial. No aortic stenosis is present.   5. Moderately dilated pulmonary artery.   6. There is a small-moderate sized circumferential pericardial effusion.  There is some tricuspid and mitral inflow variation but otherwise no signs  of cardiac tamponade (No diastolic RA/RV collapse, IVC  1.7cm).   Patient Profile     77 y.o. female Marfan's syndrome, TIA,  HTN, scoliosis, spinal stenosis, RBBB, chronic BLE edema,  osteoporosis, chronic back pain who is being evaluated for acute on chronic HFpEF.   Assessment & Plan    Acute HFpEF (newly diagnosed) Pericardial effusion (small to moderate) Patient initially presented with increased shortness of breath with exertion and decreased functional status with worsening chronic bilateral peripheral edema for the past 1 to 2 weeks.  Has no prior history of congestive heart failure.  Had an elevated BNP 855 with mildly progressive signs of pulmonary congestion on chest x-ray.  She was started on IV Lasix 80 mg with great output.  Today euvolemic on exam with mild decrease in renal function and drastically improved symptoms suggesting successful diuresis.  LVEF during this admission 55 to 60% with no regional wall motion abnormalities, mild concentric LVH, moderate dilated pulmonary artery.  Also had a small to moderate-sized circumferential pericardial effusion with no signs of tamponade. Can discharge home on Lasix 40 mg daily, PTA 20 mg.  Patient has had prior issues with  hypotension on Lasix dose.  May consider spironolactone outpatient for additional gentle diuresis if need to decrease Lasix dose. GDMT: PTA losartan 50 mg daily, metoprolol XL 50 g daily Would not use SGLT2 inhibitor due to history of recurrent UTIs. Repeat echocardiogram outpatient for monitoring of pericardial effusion, no tamponade physiology here.  Stable vital signs. I have counseled on the importance of daily weights, sodium restriction, monitoring of heart failure symptoms. Will ask office staff to reach out to patient for follow up. No available appointments now.   Pulmonary hypertension Noted on echocardiogram and CTA of the chest.  Suspect underlying chronic OSA.  Has high Mallampati score. Will need outpatient sleep study.  Hypertension Pressures have been normal to soft on current PTA medications as above.   Marfan syndrome No significant valvular issues noted on echocardiogram.  Continue to monitor.  Thyroid nodule Left Achilles tendon tear Chronic back pain Hx of TIA  - per primary team     For questions or updates, please contact [facility] Please consult [email or web address] for contact info under        Signed, Abagail Kitchens, PA-C  11/13/2022, 9:54 AM      Attending Note:   The patient was seen and examined.  Agree with assessment and plan as noted above.  Changes made to the above note as needed.  Patient seen and independently examined with Yvonna Alanis, PA .   We discussed all aspects of the encounter. I agree with the assessment and plan as stated above.    Acute diastolic CHF:  likely due to excess salt intake .  She is doing well on a reduced salt diet, and lasix 40 mg  She will keep a BP log  Will have her see [place] in several weeks  She is ok for DC   2.  Pulmonary HTN:  stable   3.  HTN:  well contolled.     I have spent a total of 40 minutes with patient reviewing hospital  notes , telemetry, EKGs, labs and examining patient as well as  establishing an assessment and plan that was discussed with the patient.  > 50% of time was spent in direct patient care.    Vesta Mixer, Montez Hageman., MD, [facility] 11/13/2022, 10:59 AM [address]  [address] Office [phone] Pager [phone]

## 2022-11-13 NOTE — Progress Notes (Signed)
Mobility Specialist Progress Note:   11/13/22 1200  Mobility  Activity Ambulated with assistance in hallway  Level of Assistance Contact guard assist, steadying assist  Assistive Device Front wheel walker  Distance Ambulated (ft) 100 ft  Activity Response Tolerated fair  Mobility Referral Yes  $Mobility charge 1 Mobility  Mobility Specialist Start Time (ACUTE ONLY) 1150  Mobility Specialist Stop Time (ACUTE ONLY) 1158  Mobility Specialist Time Calculation (min) (ACUTE ONLY) 8 min   Pt agreeable to mobility session. Required MinG assist throughout. Pt c/o R arm pain with RW use, otherwise no c/o. Pt left sitting in chair with all needs met, daughter present.   Addison Lank Mobility Specialist Please contact via SecureChat or  Rehab office at [phone]

## 2022-11-13 NOTE — Progress Notes (Signed)
Patient stood at sink to wash herself with moderate assistance from family member and Charity fundraiser. Tolerated well and oxygen saturation remained above 92% on room air throughout. Afterwards, patient ambulated with mobility tech who reported to this RN that her oxygen saturations remained above 92% while walking full length of hallway.

## 2022-11-13 NOTE — Discharge Summary (Signed)
Family Medicine Teaching [facility] Discharge Summary  Patient name: Leslie Dean Medical record number: [id number] Date of birth: [birth year] Age: 77 y.o. Gender: female Date of Admission: 11/11/2022  Date of Discharge: 11/13/22 Admitting Physician: Evette Georges, MD  Primary Care Provider: Evelena Leyden, DO Consultants: Cardiology  Indication for Hospitalization: dyspnea  Discharge Diagnoses/Problem List:  Principal Problem for Admission: new onset heart failure Other Problems addressed during stay:  Principal Problem:   CHF exacerbation (HCC) Active Problems:   HTN (hypertension)   Achilles tendon tear, left, subsequent encounter    Brief Hospital Course:  Ardyth NADINA EDIE is a 77 y.o.female with a history of HTN, Marfan syndrome, spinal stenosis, osteoporosis who was admitted to the [facility] Teaching Service at [facility] for new onset heart failure. Her hospital course is detailed below:  New onset acute heart failure Presented with dyspnea and increased peripheral edema. CXR showed pulmonary vascular congestion, cardiomegaly, interstitial edema. BNP elevated to 855, troponins flat.  CTPA negative for PE but suggestive of pulmonary hypertension. Echo suggestive of diastolic heart failure with notable small pericardial effusion. She was provided IV lasix for diuresis.  Cardiology was consulted and patient was established with HF clinic. Ultimately, patient's Lasix was increased to 40mg  daily. GDMT of losartan and metoprolol was continued. Advised to avoid SGLT2i given hx of recurrent UTIs.  She was discharged with euvolemic exam.  Left Achilles tendon tear This was evaluated prior at [facility].  Patient is able to ambulate with a walker while wearing orthopedic shoe.  Other chronic conditions were medically managed with home medications and formulary alternatives as necessary (HTN, Marfans syndrome, Spinal stenosis, TIA)  PCP Follow-up Recommendations: Consider  outpatient sleep study given evidence of pulmonary arterial HTN on echo. Consider physical therapy referral for left Achilles tendon tear.   Disposition: Home  Discharge Condition: stable  Discharge Exam:  Vitals:   11/13/22 0718 11/13/22 0828  BP: 124/63   Pulse: 81   Resp: 20   Temp: 98 F (36.7 C)   SpO2: 97% 95%   General: NAD, well appearing Cardiovascular: RRR, no murmurs, trace peripheral edema bilaterally Respiratory: normal WOB on RA, mild bibasilar crackles Abdomen: soft, NTTP, no rebound or guarding Extremities: Moving all 4 extremities equally   Significant Procedures: None  Significant Labs and Imaging:  Recent Labs  Lab 11/11/22 1434  HGB 13.3  HCT 39.0   Recent Labs  Lab 11/11/22 1434 11/12/22 0121 11/13/22 0111  NA 141 140 137  K 4.6 3.9 3.9  CL  --  101 98  CO2  --  28 28  GLUCOSE  --  101* 102*  BUN  --  10 16  CREATININE  --  0.97 1.14*  CALCIUM  --  10.0 9.5  MG  --  1.9 1.9   CTA PE 11/11/22 1. No evidence of significant pulmonary embolus. 2. Dilated central pulmonary arteries suggesting possibility of pulmonary arterial hypertension. 3. Diffuse nodular enlargement of the thyroid gland, unchanged since prior study. Stability for greater than 5 years implies benignity; no biopsy or followup indicated (ref: J Am Coll Radiol. 2015 Feb;12(2): 143-50). 4. Mild perihilar infiltration in the left lung may indicate early edema or pneumonia. 5. Large cysts in the liver may indicate giant hepatic cyst versus biliary cystadenoma. This is smaller than on previous study. 6. Mild compression of 2 midthoracic vertebra, likely chronic.  Results/Tests Pending at Time of Discharge: None  Discharge Medications:  Allergies as of 11/13/2022  Reactions   Albuterol Sulfate    Hydromorphone Hcl    Other    Says narcotics cause nausea for her        Medication List     TAKE these medications    acetaminophen 325 MG tablet Commonly  known as: TYLENOL Take 2 tablets (650 mg total) by mouth every 6 (six) hours as needed for mild pain or moderate pain.   aspirin 81 MG chewable tablet Chew by mouth.   atorvastatin 40 MG tablet Commonly known as: LIPITOR TAKE 1 TABLET(40 MG) BY MOUTH DAILY What changed: See the new instructions.   Calcium-Vitamin D 600-400 MG-UNIT Tabs Take 1 tablet by mouth daily.   ferrous sulfate 325 (65 FE) MG tablet Take 325 mg by mouth daily with breakfast.   furosemide 40 MG tablet Commonly known as: Lasix Take 1 tablet (40 mg total) by mouth daily. What changed:  medication strength how much to take   losartan 50 MG tablet Commonly known as: COZAAR Take 50 mg by mouth daily.   Magnesium Chloride-Calcium 64-106 MG Tbec Take 1 tablet by mouth daily.   metoprolol succinate 50 MG 24 hr tablet Commonly known as: TOPROL-XL Take 50 mg by mouth daily. Take with or immediately following a meal.   pregabalin 75 MG capsule Commonly known as: LYRICA Take 75 mg by mouth at bedtime.   Prolia 60 MG/ML Sosy injection Generic drug: denosumab Inject into the skin.   Vitamin D3 50 MCG (2000 UT) capsule Take 2,000 Units by mouth daily.        Discharge Instructions: Please refer to Patient Instructions section of EMR for full details.  Patient was counseled important signs and symptoms that should prompt return to medical care, changes in medications, dietary instructions, activity restrictions, and follow up appointments.   Follow-Up Appointments:  Follow-up Information     Lilland, Alana, DO .   Specialty: Family Medicine Contact information: [address] [place] [address] [phone]                 Celine Mans, MD 11/13/2022, 12:44 PM PGY-1, [facility]

## 2022-11-13 NOTE — Discharge Instructions (Addendum)
Dear [name],  Thank you for letting us participate in your care. You were hospitalized for heart failure and diagnosed with CHF exacerbation (HCC). You were treated with a water pill called lasix, and seen by the cardiology team.   POST-HOSPITAL & CARE INSTRUCTIONS Please make sure to take your medications as instructed below Please make sure to go to your follow-up appointments If you have any difficulty breathing, or chest pain please return to care Go to your follow up appointments (listed below)   DOCTOR'S APPOINTMENT   Future Appointments  Date Time Provider Department Center  11/15/2022  2:10 PM Evelena Leyden, DO [facility]  11/25/2022  1:55 PM Laurann Montana, PA-C [facility]  01/07/2023  1:15 PM Freddie Breech, DPM [facility]    Follow-up Information     Evelena Leyden, DO .   Specialty: Family Medicine Contact information: [address] [place] [address] [phone]                 Take care and be well!  Family Medicine Teaching Service Inpatient Team [place]  [facility]  [address] [place], [place] [address] [phone]

## 2022-11-15 ENCOUNTER — Encounter: Payer: Self-pay | Admitting: *Deleted

## 2022-11-15 ENCOUNTER — Telehealth: Payer: Self-pay | Admitting: *Deleted

## 2022-11-15 ENCOUNTER — Ambulatory Visit: Payer: Self-pay | Admitting: Family Medicine

## 2022-11-15 DIAGNOSIS — R5383 Other fatigue: Secondary | ICD-10-CM | POA: Diagnosis not present

## 2022-11-15 DIAGNOSIS — M533 Sacrococcygeal disorders, not elsewhere classified: Secondary | ICD-10-CM | POA: Diagnosis not present

## 2022-11-15 DIAGNOSIS — M810 Age-related osteoporosis without current pathological fracture: Secondary | ICD-10-CM | POA: Diagnosis not present

## 2022-11-15 DIAGNOSIS — E559 Vitamin D deficiency, unspecified: Secondary | ICD-10-CM | POA: Diagnosis not present

## 2022-11-15 NOTE — Transitions of Care (Post Inpatient/ED Visit) (Signed)
11/15/2022  Name: Leslie Dean MRN: [id number] DOB: February 10, 1946  Today's TOC FU Call Status: Today's TOC FU Call Status:: Successful TOC FU Call Competed TOC FU Call Complete Date: 11/15/22  Transition Care Management Follow-up Telephone Call Date of Discharge: 11/13/22 Discharge Facility: [facility]) Type of Discharge: Inpatient Admission Primary Inpatient Discharge Diagnosis:: Acute CHF exacebation How have you been since you were released from the hospital?: Same ("I am about the same, maybe a little bit better.  They increased my fluid pill and I am not happy about that, but I am taking it like they told me to.  I have to call to schedule with my PCP, I have to check my daughters work schedules, they go with me") Any questions or concerns?: No  Items Reviewed: Did you receive and understand the discharge instructions provided?: Yes (thoroughly reviewed with patient who verbalizes good understanding of same) Medications obtained,verified, and reconciled?: Yes (Medications Reviewed) (Full medication reconciliation/ review completed; no concerns or discrepancies identified; confirmed patient obtained/ is taking all newly Rx'd medications as instructed; self-manages medications and denies questions/ concerns around medications today) Any new allergies since your discharge?: No Dietary orders reviewed?: Yes Type of Diet Ordered:: Heart Healthy "as much as possible" Do you have support at home?: Yes People in Home: child(ren), adult Name of Support/Comfort Primary Source: Reports essentially independent in self-care activities; resides with adult daughters who assists as/ if needed/ indicated- both daughters work during day  Medications Reviewed Today: Medications Reviewed Today     Reviewed by Michaela Corner, RN (Registered Nurse) on 11/15/22 at [phone]  Med List Status: <None>   Medication Order Taking? Sig Documenting Provider Last Dose Status Informant  acetaminophen (TYLENOL)  325 MG tablet [id number] Yes Take 2 tablets (650 mg total) by mouth every 6 (six) hours as needed for mild pain or moderate pain. Celine Mans, MD Taking Active   aspirin 81 MG chewable tablet [id number] Yes Chew by mouth. [provider] Taking Active Self  atorvastatin (LIPITOR) 40 MG tablet [id number] Yes TAKE 1 TABLET(40 MG) BY MOUTH DAILY  Patient taking differently: Take 40 mg by mouth daily.   Evelena Leyden, DO Taking Active Self  Calcium Carb-Cholecalciferol (CALCIUM-VITAMIN D) 600-400 MG-UNIT TABS [id number]  Take 1 tablet by mouth daily. [provider]  Active Self           Med Note Michaela Corner   Fri Nov 15, 2022  9:41 AM) 11/15/22: reports during [facility] call today she IS taking  Cholecalciferol (VITAMIN D3) 50 MCG (2000 UT) capsule [id number] Yes Take 2,000 Units by mouth daily. [provider] Taking Active Self  ferrous sulfate 325 (65 FE) MG tablet [id number] Yes Take 325 mg by mouth daily with breakfast. [provider] Taking Active Self  furosemide (LASIX) 40 MG tablet [id number] Yes Take 1 tablet (40 mg total) by mouth daily. Celine Mans, MD Taking Active   losartan (COZAAR) 50 MG tablet [id number] Yes Take 50 mg by mouth daily.  [provider] Taking Active Self  Magnesium Chloride-Calcium 64-106 MG TBEC [id number] Yes Take 1 tablet by mouth daily. [provider] Taking Active Self  metoprolol succinate (TOPROL-XL) 50 MG 24 hr tablet [id number] Yes Take 50 mg by mouth daily. Take with or immediately following a meal. [provider] Taking Active Self  pregabalin (LYRICA) 75 MG capsule [id number] Yes Take 75 mg by mouth at bedtime. [provider] Taking Active Self  PROLIA 60 MG/ML SOSY injection  [id number] Yes Inject into the skin. [provider] Taking Active Self           Med Note (CARD, AMY L   Mon Nov 11, 2022  9:24 PM) Patient is due for next injection this coming week.              Home Care and Equipment/Supplies: Were Home Health Services Ordered?: Yes (reports does have home health services-- unable to verify from review of EHR post-discharge AVS) Name of Home Health Agency:: Patient reports "unsure;" unable to verify from review of EHR Has Agency set up a time to come to your home?: Yes First Home Health Visit Date: 11/16/22 Any new equipment or medical supplies ordered?: No  Functional Questionnaire: Do you need assistance with bathing/showering or dressing?: Yes (family assists as needed) Do you need assistance with meal preparation?: Yes (family assists as needed) Do you need assistance with eating?: No Do you have difficulty maintaining continence: No Do you need assistance with getting out of bed/getting out of a chair/moving?: No Do you have difficulty managing or taking your medications?: Yes (verbalizes good understanding of medication during TOC call)  Follow up appointments reviewed: PCP Follow-up appointment confirmed?: No (attempted to coordinate scheduling of HFU with PCP; after offering multiple options, patient states she cannot schedule today; has to check with daughters re: their schedules) MD Provider Line Number:[phone] Given: No (verified well-established with current PCP - however, patient reports practice told her she would be getting new PCP- "they said Dr. Clayborne Artist was no longer at the practice" encouraged patient to schedule asap) Specialist Hospital Follow-up appointment confirmed?: Yes Date of Specialist follow-up appointment?: 11/26/22 Follow-Up Specialty Provider:: cardiology provider Do you need transportation to your follow-up appointment?: No Do you understand care options if your condition(s) worsen?: Yes-patient verbalized understanding  SDOH Interventions Today    Flowsheet Row Most Recent Value  SDOH Interventions   Food Insecurity Interventions Intervention Not Indicated  Transportation Interventions Intervention  Not Indicated  [reports daughters transport to/ attend appointments with her but both work- verbalizes limitations in scheduling around their work schedules]      TOC Interventions Today    Flowsheet Row Most Recent Value  TOC Interventions   TOC Interventions Discussed/Reviewed TOC Interventions Discussed  [attempted multiple times to schedule HFU OV in real-time with scheduling care guide team: patient declined each appointment that was offered,  reports has to have daughters schedule around their work schedules]      Interventions Today    Flowsheet Row Most Recent Value  Chronic Disease   Chronic disease during today's visit Congestive Heart Failure (CHF)  General Interventions   General Interventions Discussed/Reviewed General Interventions Discussed, Doctor Visits, Referral to Nurse, Communication with, Durable Medical Equipment (DME)  Doctor Visits Discussed/Reviewed Specialist, Doctor Visits Discussed, PCP  Durable Medical Equipment (DME) Dan Humphreys, Wheelchair, Other  [verified uses walker/ wheelchair "regularly" and has scales at home]  Wheelchair Standard  PCP/Specialist Visits Compliance with follow-up visit  Communication with RN  [scheduled with RN CM Care Coordinator 11/27/22- after cardiology provider appointment on 11/26/22]  Education Interventions   Education Provided Provided Education  Provided Verbal Education On When to see the doctor, Other  [reinforced self-health management for CHF at home: need for weight monitoring/ recording at home,  action plan for weight gain]  Nutrition Interventions   Nutrition Discussed/Reviewed Nutrition Discussed  Pharmacy Interventions   Pharmacy Dicussed/Reviewed Pharmacy Topics Discussed  [Full medication review with updating medication list in EHR per patient  report]  Safety Interventions   Safety Discussed/Reviewed Safety Discussed      Caryl Pina, RN, BSN, CCRN Alumnus RN CM Care Coordination/ Transition of Care-  [facility] Care Management [phone]: direct office

## 2022-11-16 DIAGNOSIS — S86012S Strain of left Achilles tendon, sequela: Secondary | ICD-10-CM | POA: Diagnosis not present

## 2022-11-16 DIAGNOSIS — R001 Bradycardia, unspecified: Secondary | ICD-10-CM | POA: Diagnosis not present

## 2022-11-16 DIAGNOSIS — Z0001 Encounter for general adult medical examination with abnormal findings: Secondary | ICD-10-CM | POA: Diagnosis not present

## 2022-11-16 DIAGNOSIS — I10 Essential (primary) hypertension: Secondary | ICD-10-CM | POA: Diagnosis not present

## 2022-11-16 DIAGNOSIS — I509 Heart failure, unspecified: Secondary | ICD-10-CM | POA: Diagnosis not present

## 2022-11-16 DIAGNOSIS — Z8673 Personal history of transient ischemic attack (TIA), and cerebral infarction without residual deficits: Secondary | ICD-10-CM | POA: Diagnosis not present

## 2022-11-16 LAB — CULTURE, BLOOD (ROUTINE X 2)
Culture: NO GROWTH
Culture: NO GROWTH
Special Requests: ADEQUATE
Special Requests: ADEQUATE

## 2022-11-18 DIAGNOSIS — Z8673 Personal history of transient ischemic attack (TIA), and cerebral infarction without residual deficits: Secondary | ICD-10-CM | POA: Insufficient documentation

## 2022-11-18 DIAGNOSIS — I509 Heart failure, unspecified: Secondary | ICD-10-CM | POA: Insufficient documentation

## 2022-11-25 ENCOUNTER — Ambulatory Visit: Payer: PPO | Admitting: Physician Assistant

## 2022-11-25 NOTE — Progress Notes (Signed)
Cardiology Office Note    Date:  11/26/2022   ID:  Leslie Dean, DOB 06/24/1945, MRN [id number]  PCP:  Evelena Leyden, DO  Cardiologist:  Kristeen Miss, MD  Electrophysiologist:  None   Chief Complaint: f/u CHF  History of Present Illness:   Leslie Dean is a 77 y.o. female with history of Marfan's syndrome, TIA (lipids managed by PCP), HTN, scoliosis, spinal stenosis, RBBB + LPFB, chronic BLE edema, recently diagnosed acute HFpEF, dilation of pulmonary artery, pericardial effusion seen for follow-up. She was previously seen by Dr. Sharyn Lull then wanting to switch to [facility] from recent admission when she was seen for new onset HFpEF as well as left achilles tendon repair evaluated by ortho. She did come into the hospital on chronic Lasix at 20mg  daily so likely had some degree of HF beforehand. She had hypotension in the past on the 40mg  dose so it had to be lowered. She also had some soft BPs in the hospital as well. CTA did not show any evidence of aortic dilation. Echo 11/12/22 EF 55-60%, mild LVH, G1DD, trivial AI, moderately dilated PA, small-moderate pericardial effusion with some tricuspid and mitral inflow variation but otherwise no signs of cardiac tamponade. No mention of clinical pericarditis per notes. She was treated with IV Lasix. SGLT2i deferred due to h/o recurrent UTIs. Recommended for OP sleep study as well.  She returns for follow-up with her daughter overall doing well. Her weight has drifted down a few pounds since discharge. She brings in a BP log which shows BP ranges in the mid 90s systolic to 140s. Overall she reports she is doing well and remains free of the SOB that brought her into the hospital. BP 90/60 by CMA, 96/64 by me, asymptomatic aside from chronic back pain. She has no formal hx autoimmune disease or recent illness. She does report some daytime fatigue, does not think she overtly snores.   Labwork independently reviewed: 11/2022 Mg 1.9, K 3.9, Cr 1.14 up  from 0.9s, trop neg, d-dimer 0.80, BNP 855, albumin 3.3, AST/ALT OK, H/H/Plt ok 04/2022 TSH OK  Past History   Past Medical History:  Diagnosis Date   Hepatic cyst 12/03/2012   Hypertension    Scoliosis    Spinal stenosis    Stroke (HCC)    03/24/2020    Past Surgical History:  Procedure Laterality Date   CESAREAN SECTION     CHOLECYSTECTOMY N/A 12/04/2012   Procedure: LAPAROSCOPIC Liver Cyst Unroofing and removal of gallbladder;  Surgeon: Almond Lint, MD;  Location: [facility]S;  Service: General;  Laterality: N/A;   CYST EXCISION     liver cyst removal   EYE SURGERY     FEMUR IM NAIL Left 10/18/2019   Procedure: INTRAMEDULLARY (IM) NAIL FEMORAL;  Surgeon: Durene Romans, MD;  Location: [facility] ORS;  Service: Orthopedics;  Laterality: Left;    Current Medications: Current Meds  Medication Sig   acetaminophen (TYLENOL) 325 MG tablet Take 2 tablets (650 mg total) by mouth every 6 (six) hours as needed for mild pain or moderate pain.   aspirin 81 MG chewable tablet Chew by mouth.   atorvastatin (LIPITOR) 40 MG tablet TAKE 1 TABLET(40 MG) BY MOUTH DAILY (Patient taking differently: Take 40 mg by mouth daily.)   Calcium Carb-Cholecalciferol (CALCIUM-VITAMIN D) 600-400 MG-UNIT TABS Take 1 tablet by mouth daily.   Cholecalciferol (VITAMIN D3) 50 MCG (2000 UT) capsule Take 2,000 Units by mouth daily.   ferrous sulfate 325 (65 FE) MG tablet  Take 325 mg by mouth daily with breakfast.   furosemide (LASIX) 40 MG tablet Take 1 tablet (40 mg total) by mouth daily.   losartan (COZAAR) 50 MG tablet Take 50 mg by mouth daily.    Magnesium Chloride-Calcium 64-106 MG TBEC Take 1 tablet by mouth daily.   metoprolol succinate (TOPROL-XL) 50 MG 24 hr tablet Take 50 mg by mouth daily. Take with or immediately following a meal.   pregabalin (LYRICA) 75 MG capsule Take 75 mg by mouth at bedtime.   PROLIA 60 MG/ML SOSY injection Inject into the skin.      Allergies:   Albuterol sulfate, Hydromorphone hcl, and  Other   Social History   Socioeconomic History   Marital status: Divorced    Spouse name: Not on file   Number of children: Not on file   Years of education: Not on file   Highest education level: Not on file  Occupational History   Not on file  Tobacco Use   Smoking status: Former    Packs/day: 1    Types: Cigarettes    Quit date: 10/09/2019    Years since quitting: 3.1    Passive exposure: Past   Smokeless tobacco: Never  Substance and Sexual Activity   Alcohol use: No   Drug use: No   Sexual activity: Not on file  Other Topics Concern   Not on file  Social History Narrative   Lives with daughter and grandaughter   Right Handed   Drinks 1-2 cups caffeine daily   Social Determinants of Health   Financial Resource Strain: Not on file  Food Insecurity: No Food Insecurity (11/15/2022)   Hunger Vital Sign    Worried About Running Out of Food in the Last Year: Never true    Ran Out of Food in the Last Year: Never true  Transportation Needs: No Transportation Needs (11/15/2022)   PRAPARE - Administrator, Civil Service (Medical): No    Lack of Transportation (Non-Medical): No  Physical Activity: Not on file  Stress: Not on file  Social Connections: Not on file     Family History:  The patient's family history includes Marfan syndrome in her grandchild.  ROS:   Please see the history of present illness.  All other systems are reviewed and otherwise negative.    EKG(s)/Additional Testing   EKG:  EKG is not ordered today but reviewed from recent stay, NSR with RBBB/LPFB nonspecific TW changes  CV Studies: Cardiac studies reviewed are outlined and summarized above. Otherwise please see EMR for full report.  Recent Labs: 04/10/2022: TSH 0.473 11/11/2022: ALT 14; B Natriuretic Peptide 855.3; Hemoglobin 13.3; Platelets 215 11/13/2022: BUN 16; Creatinine, Ser 1.14; Magnesium 1.9; Potassium 3.9; Sodium 137  Recent Lipid Panel    Component Value Date/Time   CHOL  129 03/27/2022 1136   TRIG 74 03/27/2022 1136   HDL 54 03/27/2022 1136   CHOLHDL 2.4 03/27/2022 1136   CHOLHDL 3.4 04/05/2020 0512   VLDL 19 04/05/2020 0512   LDLCALC 60 03/27/2022 1136    PHYSICAL EXAM:    VS:  BP 96/64   Pulse 63   Ht 5\' 6"  (1.676 m)   Wt 193 lb (87.5 kg)   SpO2 95%   BMI 31.15 kg/m   BMI: Body mass index is 31.15 kg/m.  GEN: Well nourished, well developed female in no acute distress HEENT: normocephalic, atraumatic Neck: no JVD, carotid bruits, or masses Cardiac: RRR; no murmurs, rubs, or gallops, no  edema  Respiratory:  clear to auscultation bilaterally, normal work of breathing GI: soft, nontender, nondistended, + BS MS: no deformity or atrophy Skin: warm and dry, no rash Neuro:  Alert and Oriented x 3, Strength and sensation are intact, follows commands Psych: euthymic mood, full affect  Wt Readings from Last 3 Encounters:  11/26/22 193 lb (87.5 kg)  11/13/22 199 lb 1.2 oz (90.3 kg)  03/27/22 204 lb (92.5 kg)     ASSESSMENT & PLAN:   1. Chronic HFpEF, HTN - appears euvolemic on exam. SBP borderline low, has been an issue on Lasix 40mg  daily in the past. Currently on Lasix 40mg  daily, Toprol 50mg  daily, losartan 50mg  daily. SGLT2i not pursued due to reasons above. Would hold off adding spironolactone given tendency for soft BP. Will check BMET today to guide med dosing. Will also arrange sleep study.  2. Dilated pulmonary artery - pursue [place] sleep study. PHTN not explicitly outlined on echo.  3. Pericardial effusion - discussed with Dr. [name]. He felt likely related to CHF rather than ominous etiology. No other inciting factors or symptoms to suggest pericarditis. Advised to f/u PCP for general health maintenance but otherwise Dr. [name] suggests f/u echo in several months' time. Will arrange a limited echo for 3 months with f/u appointment after.  4. Fatigue - check TSH and sleep study.  5. RBBB+LPFB - no bradycardia seen at this time.  Continue to review at each visit since chronically on metoprolol as well.    Disposition: F/u with me after echo in 3 months.   Medication Adjustments/Labs and Tests Ordered: Current medicines are reviewed at length with the patient today.  Concerns regarding medicines are outlined above. Medication changes, Labs and Tests ordered today are summarized above and listed in the Patient Instructions accessible in Encounters.   Signed, Laurann Montana, PA-C  11/26/2022 2:33 PM    [facility] Phone: [phone]; Fax: [phone]

## 2022-11-26 ENCOUNTER — Encounter: Payer: Self-pay | Admitting: Physician Assistant

## 2022-11-26 ENCOUNTER — Ambulatory Visit: Payer: PPO | Attending: Physician Assistant | Admitting: Physician Assistant

## 2022-11-26 ENCOUNTER — Telehealth: Payer: Self-pay | Admitting: *Deleted

## 2022-11-26 VITALS — BP 96/64 | HR 63 | Ht 66.0 in | Wt 193.0 lb

## 2022-11-26 DIAGNOSIS — R5383 Other fatigue: Secondary | ICD-10-CM | POA: Diagnosis not present

## 2022-11-26 DIAGNOSIS — I452 Bifascicular block: Secondary | ICD-10-CM

## 2022-11-26 DIAGNOSIS — I3139 Other pericardial effusion (noninflammatory): Secondary | ICD-10-CM | POA: Diagnosis not present

## 2022-11-26 DIAGNOSIS — I10 Essential (primary) hypertension: Secondary | ICD-10-CM | POA: Diagnosis not present

## 2022-11-26 DIAGNOSIS — I5032 Chronic diastolic (congestive) heart failure: Secondary | ICD-10-CM

## 2022-11-26 DIAGNOSIS — I288 Other diseases of pulmonary vessels: Secondary | ICD-10-CM | POA: Diagnosis not present

## 2022-11-26 DIAGNOSIS — Q8740 Marfan's syndrome, unspecified: Secondary | ICD-10-CM

## 2022-11-26 NOTE — Patient Instructions (Signed)
Medication Instructions:  Your physician recommends that you continue on your current medications as directed. Please refer to the Current Medication list given to you today.  *If you need a refill on your cardiac medications before your next appointment, please call your pharmacy*   Lab Work: BMET, TSH rfx If you have labs (blood work) drawn today and your tests are completely normal, you will receive your results only by: MyChart Message (if you have MyChart) OR A paper copy in the mail If you have any lab test that is abnormal or we need to change your treatment, we will call you to review the results.   Testing/Procedures: Sleep study Your physician has recommended that you have a sleep study. This test records several body functions during sleep, including: brain activity, eye movement, oxygen and carbon dioxide blood levels, heart rate and rhythm, breathing rate and rhythm, the flow of air through your mouth and nose, snoring, body muscle movements, and chest and belly movement.   ECHO in 3 months Your physician has requested that you have an echocardiogram. Echocardiography is a painless test that uses sound waves to create images of your heart. It provides your doctor with information about the size and shape of your heart and how well your heart's chambers and valves are working. This procedure takes approximately one hour. There are no restrictions for this procedure. Please do NOT wear cologne, perfume, aftershave, or lotions (deodorant is allowed). Please arrive 15 minutes prior to your appointment time.    Follow-Up: At [facility], you and your health needs are our priority.  As part of our continuing mission to provide you with exceptional heart care, we have created designated Provider Care Teams.  These Care Teams include your primary Cardiologist (physician) and Advanced Practice Providers (APPs -  Physician Assistants and Nurse Practitioners) who all work  together to provide you with the care you need, when you need it.   Your next appointment:   3 month(s)  after ECHO   Provider:   Ronie Spies, PA-C

## 2022-11-26 NOTE — Telephone Encounter (Signed)
Pt was seen in the office with Ronie Spies, [facility] who ordered an Itamar study. Pt agreeable to signed waiver and not open the box until she has been called with the PIN#.

## 2022-11-27 ENCOUNTER — Ambulatory Visit: Payer: PPO | Attending: Physician Assistant

## 2022-11-27 ENCOUNTER — Telehealth: Payer: Self-pay

## 2022-11-27 ENCOUNTER — Telehealth: Payer: Self-pay | Admitting: Physician Assistant

## 2022-11-27 DIAGNOSIS — R7989 Other specified abnormal findings of blood chemistry: Secondary | ICD-10-CM

## 2022-11-27 DIAGNOSIS — E875 Hyperkalemia: Secondary | ICD-10-CM

## 2022-11-27 DIAGNOSIS — I10 Essential (primary) hypertension: Secondary | ICD-10-CM

## 2022-11-27 DIAGNOSIS — Z79899 Other long term (current) drug therapy: Secondary | ICD-10-CM

## 2022-11-27 LAB — BASIC METABOLIC PANEL
BUN/Creatinine Ratio: 31 — ABNORMAL HIGH (ref 12–28)
BUN/Creatinine Ratio: 37 — ABNORMAL HIGH (ref 12–28)
BUN: 46 mg/dL — ABNORMAL HIGH (ref 8–27)
BUN: 51 mg/dL — ABNORMAL HIGH (ref 8–27)
CO2: 23 mmol/L (ref 20–29)
CO2: 26 mmol/L (ref 20–29)
Calcium: 10.6 mg/dL — ABNORMAL HIGH (ref 8.7–10.3)
Calcium: 10.4 mg/dL — ABNORMAL HIGH (ref 8.7–10.3)
Chloride: 102 mmol/L (ref 96–106)
Chloride: 104 mmol/L (ref 96–106)
Creatinine, Ser: 1.49 mg/dL — ABNORMAL HIGH (ref 0.57–1.00)
Creatinine, Ser: 1.37 mg/dL — ABNORMAL HIGH (ref 0.57–1.00)
Glucose: 106 mg/dL — ABNORMAL HIGH (ref 70–99)
Glucose: 110 mg/dL — ABNORMAL HIGH (ref 70–99)
Potassium: 5.7 mmol/L — ABNORMAL HIGH (ref 3.5–5.2)
Potassium: 5.5 mmol/L — ABNORMAL HIGH (ref 3.5–5.2)
Sodium: 139 mmol/L (ref 134–144)
Sodium: 140 mmol/L (ref 134–144)
eGFR: 36 mL/min/{1.73_m2} — ABNORMAL LOW (ref >59)
eGFR: 40 mL/min/{1.73_m2} — ABNORMAL LOW (ref >59)

## 2022-11-27 LAB — T4F: T4,Free (Direct): 1.94 ng/dL — ABNORMAL HIGH (ref 0.82–1.77)

## 2022-11-27 LAB — TSH RFX ON ABNORMAL TO FREE T4: TSH: 0.122 u[IU]/mL — ABNORMAL LOW (ref 0.450–4.500)

## 2022-11-27 NOTE — Telephone Encounter (Signed)
Attempted to contact patient to discuss lab results, no answer and unable to leave message. Will try again later this morning.  Per Ronie Spies, PA-C: Please let patient know several things cropped up on labs yesterday that need attention. Potassium, kidney function, calcium level, thyroid all became abnormal since last check.   Recommendations:  - stop losartan  - bring in for recheck stat BMET this morning to re-evaluate potassium in particular - I am out of the office today so please have results reviewed by DOD when this results. I will still review upon return tomorrow to decide timing of next recheck if K is not high enough for sending to ED  - avoid any potassium supplements if taking, did not see any on list, avoid excess potassium in diet  - re: Thyroid labs - this could reflect hyperthyroidism. Please place urgent referral to endocrinologist for possible hyperthyroidism since I do not see she takes any thyroid medication. In the meantime please have her schedule a visit with PCP ASAP to review possible medication adjustment for this  Addendum: would also have her pause her Lasix for now as well. Do not throw away but do not take any further until we see improvement in kidneys.

## 2022-11-27 NOTE — Telephone Encounter (Signed)
Stat BMET resulted to inbox and reviewed. Confirms hyperkalemia at 5.5, AKI, hypercalcemia as seen on labs yesterday. Elevation in creatinine, hyperkalemia, calcium not as pronounced as yesterday.  I called patient to discuss f/u BMET. I was unable to reach her or daughter [name] but was able to speak with Art Buff on DPR who will relay this message. I recommended to remain off furosemide (Lasix) and losartan for now as instructed in earlier phone note. I advised that they have her increase oral hydration of fluid intake over the next few days. ER precautions reviewed but thankfully patient was feeling overall well at OV yesterday.  Will route to triage to please bring patient in for stat BMET on Friday morning and also put her on my clinic schedule when I'm back in mid July (17th or 18th). Can keep the Oct 2024 appt but needs short term f/u as well given this balance of fluid + kidneys. We'll likely need to follow labs closely in the short term to decide when to restart diuretic. Weight was down at recent OV suggesting more volume depletion so we should be fine to hold the meds in the short term.

## 2022-11-27 NOTE — Telephone Encounter (Signed)
Incoming call transferred from operator. Spoke with patient's daughter Leslie Dean and patient regarding lab results and recommendations from [employer], [place].  Leslie Dean will be bringing patient by office today to have STAT BMET drawn.  Patient states she has new PCP with [facility], care is overseen by Dr. Annita Brod. Will forward lab results to Dr. Darleene Cleaver. PCP updated on chart.

## 2022-11-27 NOTE — Telephone Encounter (Signed)
Unable to reach patient. Left message for daughter Rachael Fee to callback.  Was able to speak with daughter Art Buff. Relayed information from [facility] regarding lab results/recommendations. She verbalized understanding and will call her sister [name] to see if they are able to get patient in for repeat labwork today.  Referral to endocrinologist ordered. Results faxed to PCP office.  Rinaldo Cloud to callback regarding lab.

## 2022-11-27 NOTE — Patient Outreach (Signed)
  Care Coordination   11/27/2022 Name: Leslie Dean MRN: [id number] DOB: Jan 27, 1946   Care Coordination Outreach Attempts:  An unsuccessful telephone outreach was attempted today to offer the patient information about available care coordination services.  Follow Up Plan:  Additional outreach attempts will be made to offer the patient care coordination information and services.   Encounter Outcome:  No Answer   Care Coordination Interventions:  No, not indicated    [name] RN, BSN, [facility] Care Coordinator [facility]   Phone: [phone]

## 2022-11-28 ENCOUNTER — Other Ambulatory Visit: Payer: Self-pay

## 2022-11-28 DIAGNOSIS — E875 Hyperkalemia: Secondary | ICD-10-CM

## 2022-11-28 DIAGNOSIS — Z79899 Other long term (current) drug therapy: Secondary | ICD-10-CM

## 2022-11-28 DIAGNOSIS — R7989 Other specified abnormal findings of blood chemistry: Secondary | ICD-10-CM

## 2022-11-28 NOTE — Addendum Note (Signed)
Addended by: Trudie Reed D on: 11/28/2022 08:25 AM   Modules accepted: Orders

## 2022-11-28 NOTE — Telephone Encounter (Signed)
Spoke to the patient, she is still kind of confused why f/u with APP. Made several attempts to explain why the APP requested f/u visit. Pt stated she will call the office back to schedule f/u appointment. Pt is scheduled for lab appointment on 6/21. Pt voiced understanding.

## 2022-11-29 ENCOUNTER — Inpatient Hospital Stay: Payer: PPO | Admitting: Family Medicine

## 2022-11-29 ENCOUNTER — Ambulatory Visit: Payer: PPO

## 2022-11-29 NOTE — Telephone Encounter (Signed)
Lab appointment scheduled for 12/03/22 for stat BMET.   Message sent to patient via active MyChart informing her lab appointment has been scheduled and to notify us if she is unable to make it that day.

## 2022-11-29 NOTE — Telephone Encounter (Signed)
I spoke with patient and relayed importance of having labwork rechecked - did not want to leave this open ended with no actual plan since we still have losartan and Lasix on hold. She reports she is feeling well and SBP 120s. She verbalized understanding of the necessity of recheck due to danger of having abnormal potassium and kidney function. However, she states she can only come to the office certain days of the week related to her family's schedule. We discussed rechecking sooner but she wants to return to the office on Tuesday 6/25 to have this rechecked. I will forward to triage team to help set this up with a BMET order. Patient states it's very hard for her to have a specific *time* to come to the lab and does not want to be charged for a no-show if she ends up not being able to make it so wants to know if there is a way we can do this without an actual appointment. She was on the lab schedule for 3pm today but states she was not told there was a specific time she had to be there and therefore does not want to be charged for this appointment. Is there a way we can do this without a specific lab appointment to come? She was obviously encouraged to come for the bloodwork but is concerned about cost in the event that transportation falls through. Can also offer referral to social work if needed. Getting labs at [facility] might be another option.  I would continue to encourage closer follow-up, ie 7/17 or 7/18 with me than scheduled in October given the issues above requiring med adjustment but it sounds like from earlier phone note she had not yet committed to this. The spots fill up fast so would re-offer. Thank you!  Documentation only: Leslie Dean will be covering my inbox 6/25-6/26 so will also route for her information - if renal function improved back to baseline, would restart Lasix at 20mg  daily with BMET 1 week thereafter and remain off losartan.

## 2022-11-29 NOTE — Telephone Encounter (Signed)
Patient scheduled to repeat STAT BMET today to follow-up on elevated potassium level.  Called patient and daughter answered phone, she states patient is unable to come to the phone. Informed daughter patient had a lab appt scheduled today to follow-up on potassium level. Daughter states patient is not going to come in to have blood drawn. She refused to give a reason, stating she is just not coming in to have labs drawn.  Encouraged daughter to contact our office if she or patient has any questions or concerns. Daughter thanked me and ended the call.

## 2022-12-02 ENCOUNTER — Telehealth: Payer: Self-pay | Admitting: Cardiovascular Disease

## 2022-12-02 ENCOUNTER — Telehealth: Payer: Self-pay | Admitting: Physician Assistant

## 2022-12-02 DIAGNOSIS — R7989 Other specified abnormal findings of blood chemistry: Secondary | ICD-10-CM

## 2022-12-02 NOTE — Telephone Encounter (Signed)
Pt's daughter would like a callback regarding office discussion of her mom seeing an Endocrinologist. Please advise

## 2022-12-02 NOTE — Telephone Encounter (Signed)
Spoke with the daughter and advised on referral to [facility] endocrinology. Advised that referral was sent as urgent but I am not sure how soon they are able to get patient's in to be seen. The daughter is going to reach out to their office.  I also confirmed lab appointment for tomorrow for a recheck of BMET.

## 2022-12-02 NOTE — Telephone Encounter (Signed)
Pt would like a callback regarding referral for a different Endocrinologist being that their first availability is not till October. Please advise.

## 2022-12-02 NOTE — Telephone Encounter (Signed)
Left message for patient's daughter to call back. 

## 2022-12-02 NOTE — Telephone Encounter (Signed)
Daughter returned RN's call. 

## 2022-12-02 NOTE — Telephone Encounter (Signed)
Spoke with daughter and advised that we could send a referral to Dr. Talmage Nap. I am not sure what their wait time is. Advised her to have her mother follow up with her PCP in the meantime to address abnormal thyroid.

## 2022-12-03 ENCOUNTER — Ambulatory Visit: Payer: PPO | Attending: Physician Assistant

## 2022-12-03 DIAGNOSIS — R7989 Other specified abnormal findings of blood chemistry: Secondary | ICD-10-CM | POA: Diagnosis not present

## 2022-12-03 DIAGNOSIS — Z79899 Other long term (current) drug therapy: Secondary | ICD-10-CM | POA: Diagnosis not present

## 2022-12-03 DIAGNOSIS — E875 Hyperkalemia: Secondary | ICD-10-CM | POA: Diagnosis not present

## 2022-12-03 LAB — BASIC METABOLIC PANEL
BUN/Creatinine Ratio: 24 (ref 12–28)
BUN: 32 mg/dL — ABNORMAL HIGH (ref 8–27)
CO2: 27 mmol/L (ref 20–29)
Calcium: 10.5 mg/dL — ABNORMAL HIGH (ref 8.7–10.3)
Chloride: 105 mmol/L (ref 96–106)
Creatinine, Ser: 1.34 mg/dL — ABNORMAL HIGH (ref 0.57–1.00)
Glucose: 110 mg/dL — ABNORMAL HIGH (ref 70–99)
Potassium: 5.1 mmol/L (ref 3.5–5.2)
Sodium: 138 mmol/L (ref 134–144)
eGFR: 41 mL/min/{1.73_m2} — ABNORMAL LOW (ref >59)

## 2022-12-04 ENCOUNTER — Telehealth: Payer: Self-pay | Admitting: Cardiovascular Disease

## 2022-12-04 NOTE — Telephone Encounter (Signed)
Patient is returning phone call.  °

## 2022-12-04 NOTE — Telephone Encounter (Signed)
The patient has been notified of the result and verbalized understanding.  All questions (if any) were answered. Frutoso Schatz, RN 12/04/2022 12:34 PM   Patient refused to come back and see APP. She states that she only wants to see the doctor. Scheduled her to see Dr. [name] 8/2

## 2022-12-04 NOTE — Telephone Encounter (Signed)
-----   Message from Leone Brand, NP sent at 12/03/2022  7:02 PM EDT ----- Kidney function is improving continue current meds no adding back her ARB or Lasix.  Still hold back on eating high K+ food.  Pt needs to be seen in July with DAyna per her note of 11/27/22  please arrange.  Thank you  Nada Boozer, FNP-C

## 2022-12-05 NOTE — Telephone Encounter (Signed)
Spoke with pt Daughter Dennie Bible ok per DPR.  Advised of providers response:  Returning to inbox today Vernona Rieger was covering). Agree with plan below though patient declined earlier follow-up. Since she does not wish to return to [place] in July I would suggest she touch base with PCP to discuss evaluation/possible f/u of hypercalcemia. Labs have recently consistently demonstrated elevated calcium level so may need to cut back on any calcium supplements she may be taking and have this rechecked with primary care. Thank you.   Daughter request labs be sent to PCP Dr. Darleene Cleaver with [facility].  Results sent as requested. All questions answered.

## 2022-12-05 NOTE — Telephone Encounter (Signed)
Returning to inbox today Leslie Dean was covering). Agree with plan below though patient declined earlier follow-up. Since she does not wish to return to [place] in July I would suggest she touch base with PCP to discuss evaluation/possible f/u of hypercalcemia. Labs have recently consistently demonstrated elevated calcium level so may need to cut back on any calcium supplements she may be taking and have this rechecked with primary care. Thank you.

## 2022-12-06 DIAGNOSIS — M4800 Spinal stenosis, site unspecified: Secondary | ICD-10-CM | POA: Diagnosis not present

## 2022-12-06 DIAGNOSIS — Z79899 Other long term (current) drug therapy: Secondary | ICD-10-CM | POA: Diagnosis not present

## 2022-12-06 DIAGNOSIS — M1990 Unspecified osteoarthritis, unspecified site: Secondary | ICD-10-CM | POA: Diagnosis not present

## 2022-12-06 DIAGNOSIS — Z8673 Personal history of transient ischemic attack (TIA), and cerebral infarction without residual deficits: Secondary | ICD-10-CM | POA: Diagnosis not present

## 2022-12-06 DIAGNOSIS — Z993 Dependence on wheelchair: Secondary | ICD-10-CM | POA: Diagnosis not present

## 2022-12-06 DIAGNOSIS — Z9181 History of falling: Secondary | ICD-10-CM | POA: Diagnosis not present

## 2022-12-06 DIAGNOSIS — W19XXXD Unspecified fall, subsequent encounter: Secondary | ICD-10-CM | POA: Diagnosis not present

## 2022-12-06 DIAGNOSIS — Z7982 Long term (current) use of aspirin: Secondary | ICD-10-CM | POA: Diagnosis not present

## 2022-12-06 DIAGNOSIS — N189 Chronic kidney disease, unspecified: Secondary | ICD-10-CM | POA: Diagnosis not present

## 2022-12-06 DIAGNOSIS — S86012D Strain of left Achilles tendon, subsequent encounter: Secondary | ICD-10-CM | POA: Diagnosis not present

## 2022-12-06 DIAGNOSIS — Q8740 Marfan's syndrome, unspecified: Secondary | ICD-10-CM | POA: Diagnosis not present

## 2022-12-06 DIAGNOSIS — M810 Age-related osteoporosis without current pathological fracture: Secondary | ICD-10-CM | POA: Diagnosis not present

## 2022-12-06 DIAGNOSIS — I130 Hypertensive heart and chronic kidney disease with heart failure and stage 1 through stage 4 chronic kidney disease, or unspecified chronic kidney disease: Secondary | ICD-10-CM | POA: Diagnosis not present

## 2022-12-06 DIAGNOSIS — Z87891 Personal history of nicotine dependence: Secondary | ICD-10-CM | POA: Diagnosis not present

## 2022-12-06 DIAGNOSIS — I110 Hypertensive heart disease with heart failure: Secondary | ICD-10-CM | POA: Diagnosis not present

## 2022-12-06 DIAGNOSIS — E785 Hyperlipidemia, unspecified: Secondary | ICD-10-CM | POA: Diagnosis not present

## 2022-12-06 DIAGNOSIS — Z602 Problems related to living alone: Secondary | ICD-10-CM | POA: Diagnosis not present

## 2022-12-06 DIAGNOSIS — I509 Heart failure, unspecified: Secondary | ICD-10-CM | POA: Diagnosis not present

## 2022-12-11 NOTE — Telephone Encounter (Signed)
Prior Authorization for ITAMAR sent to [facility] via web portal. Tracking Number .  NO PA REQ 

## 2022-12-13 DIAGNOSIS — I509 Heart failure, unspecified: Secondary | ICD-10-CM | POA: Diagnosis not present

## 2022-12-13 DIAGNOSIS — M810 Age-related osteoporosis without current pathological fracture: Secondary | ICD-10-CM | POA: Diagnosis not present

## 2022-12-13 DIAGNOSIS — I10 Essential (primary) hypertension: Secondary | ICD-10-CM | POA: Diagnosis not present

## 2022-12-13 DIAGNOSIS — H9190 Unspecified hearing loss, unspecified ear: Secondary | ICD-10-CM | POA: Diagnosis not present

## 2022-12-13 NOTE — Telephone Encounter (Signed)
Left message for the pt sleep study has been approved and she can proceed. I left PIN# [id number] on vm. Left message if she could do her sleep study between tonight and early next week, so that we will have results in before she see's Dr. [name] on 01/10/23.   I will send through to [facility] CHART as well.

## 2022-12-15 ENCOUNTER — Encounter (INDEPENDENT_AMBULATORY_CARE_PROVIDER_SITE_OTHER): Payer: PPO | Admitting: Cardiology

## 2022-12-15 DIAGNOSIS — G4733 Obstructive sleep apnea (adult) (pediatric): Secondary | ICD-10-CM | POA: Diagnosis not present

## 2022-12-16 ENCOUNTER — Ambulatory Visit: Payer: PPO | Attending: Physician Assistant

## 2022-12-16 ENCOUNTER — Telehealth: Payer: Self-pay | Admitting: Physician Assistant

## 2022-12-16 DIAGNOSIS — I288 Other diseases of pulmonary vessels: Secondary | ICD-10-CM

## 2022-12-16 DIAGNOSIS — I5032 Chronic diastolic (congestive) heart failure: Secondary | ICD-10-CM

## 2022-12-16 DIAGNOSIS — I10 Essential (primary) hypertension: Secondary | ICD-10-CM

## 2022-12-16 DIAGNOSIS — I3139 Other pericardial effusion (noninflammatory): Secondary | ICD-10-CM

## 2022-12-16 DIAGNOSIS — R5383 Other fatigue: Secondary | ICD-10-CM

## 2022-12-16 NOTE — Telephone Encounter (Signed)
Sleep study results reviewed. Will route to Dr. Mayford Knife for clarification for my knowledge - report indicates mild OSA but under recommendations says normal sleep study. Wondering if this is something that needs further f/u with sleep medicine or if this was considered normal. Appreciate your time!  Commentary re: arrhythmia also noted, will address this separately after MD reply above.

## 2022-12-16 NOTE — Procedures (Addendum)
SLEEP STUDY REPORT Patient Information Study Date: 12/15/2022 Patient Name: Leslie Dean Patient ID: [id number] Birth Date: 27-Apr-1946 Age: 77 Gender: Female BMI: 31.2 (W=194 lb, H=5' 6'') Stopbang: 3 Referring Physician: Ronie Spies, PA  TEST DESCRIPTION: Home sleep apnea testing was completed using the WatchPat, a Type 1 device, utilizing peripheral arterial tonometry (PAT), chest movement, actigraphy, pulse oximetry, pulse rate, body position and snore. AHI was calculated with apnea and hypopnea using valid sleep time as the denominator. RDI includes apneas, hypopneas, and RERAs. The data acquired and the scoring of sleep and all associated events were performed in accordance with the recommended standards and specifications as outlined in the AASM Manual for the Scoring of Sleep and Associated Events 2.2.0 (2015).  sordered breathing.    TEST DESCRIPTION: Home sleep apnea testing was completed using the WatchPat, a Type 1 device, utilizing peripheral arterial tonometry (PAT), chest movement, actigraphy, pulse oximetry, pulse rate, body position and snore. AHI was calculated with apnea and hypopnea using valid sleep time as the denominator. RDI includes apneas, hypopneas, and RERAs. The data acquired and the scoring of sleep and all associated events were performed in accordance with the recommended standards and specifications as outlined in the AASM Manual for the Scoring of Sleep and Associated Events 2.2.0 (2015).   FINDINGS:   1. Mild Obstructive Sleep Apnea with AHI 11.6/hr.   2. No Central Sleep Apnea with pAHIc 1.6/hr.   3. Oxygen desaturations as low as 80%.   4. Mild snoring was present. O2 sats were < 88% for 2.4 min.   5. Total sleep time was 3 hrs and 0 min.   6. 0% of total sleep time was spent in REM sleep.   7. Normal sleep onset latency at 19 min.   8. Total awakenings were 2.   9. Arrhythmia detection:  Suggestive of possible brief atrial fibrillation lasting  3 hours, 4 min and 38 seconds.  This is not diagnostic and further testing with outpatient telemetry monitoring is recommended.  DIAGNOSIS: Mild Obstructive Sleep Apnea (G47.33) Possible Atrial Fibrillation No REM Sleep  RECOMMENDATIONS:   1.  Clinical correlation of these findings is necessary.  The decision to treat obstructive sleep apnea (OSA) is usually based on the presence of apnea symptoms or the presence of associated medical conditions such as Hypertension, Congestive Heart Failure, Atrial Fibrillation or Obesity.  The most common symptoms of OSA are snoring, gasping for breath while sleeping, daytime sleepiness and fatigue.   2.  Initiating apnea therapy is recommended given the presence of symptoms and/or associated conditions. Recommend proceeding with one of the following:     a.  Auto-CPAP therapy with a pressure range of 5-20cm H2O.     b.  An oral appliance (OA) that can be obtained from certain dentists with expertise in sleep medicine.  These are primarily of use in non-obese patients with mild and moderate disease.     c.  An ENT consultation which may be useful to look for specific causes of obstruction and possible treatment options.     d.  If patient is intolerant to PAP therapy, consider referral to ENT for evaluation for hypoglossal nerve stimulator.   3.  Close follow-up is necessary to ensure success with CPAP or oral appliance therapy for maximum benefit.  4.  A follow-up oximetry study on CPAP is recommended to assess the adequacy of therapy and determine the need for supplemental oxygen or the potential need for Bi-level  therapy.  An arterial blood gas to determine the adequacy of baseline ventilation and oxygenation should also be considered.  5.  Healthy sleep recommendations include:  adequate nightly sleep (normal 7-9 hrs/night), avoidance of caffeine after noon and alcohol near bedtime, and maintaining a sleep environment that is cool, dark and  quiet.  6.  Weight loss for overweight patients is recommended.  Even modest amounts of weight loss can significantly improve the severity of sleep apnea.  7.  Snoring recommendations include:  weight loss where appropriate, side sleeping, and avoidance of alcohol before bed.  8.  Operation of motor vehicle should be avoided when sleepy.  Signature:   Armanda Magic, MD; [facility]; Diplomat, American Board of Sleep Medicine Electronically Signed: 12/16/2022 8:21:27 AM

## 2022-12-16 NOTE — Telephone Encounter (Signed)
Spoke to patients daughter Dennie Bible (per DPR) and let her know of the sleep study results. Pts daughter stated she would speak to her mother about the in-lab sleep study and the 2 week Zio. Dennie Bible stated that she did not believe her mother would do the in-lab sleep study but may do the zio monitor. I told her she could call or Mychart message [place] back to let us know what she was willing to do and we would get things set up for them.

## 2022-12-16 NOTE — Telephone Encounter (Signed)
Please call patient. I reviewed her sleep study results with Dr. Mayford Knife. This did show mild sleep apnea, but she did not have any REM sleep while the study was obtained, so her degree of sleep apnea may not be accurate by this study. Dr. Mayford Knife has recommended she be sent for in-lab study and sleep team will work on arranging this. Please let her know to reach out to our office if she does not hear back within a week. Of note, during the study, she had possible episode of atrial fibrillation or irregular heartbeat during sleep. The sleep study is not able to definitively diagnose this and therefore outpatient event monitor recommended. Please arrange 2 week non live Zio for dx of possible atrial fibrillation. Would also strongly encourage her to f/u with PCP for her abnormal thyroid function ASAP as previously mentioned as it may take a while to get in with endocrinology - if hyperthyroidism is present, could be driving irregular heartbeat. Thank you!

## 2022-12-17 ENCOUNTER — Other Ambulatory Visit: Payer: Self-pay | Admitting: Physician Assistant

## 2022-12-17 ENCOUNTER — Ambulatory Visit: Payer: PPO | Attending: Physician Assistant

## 2022-12-17 ENCOUNTER — Telehealth: Payer: Self-pay

## 2022-12-17 DIAGNOSIS — R9431 Abnormal electrocardiogram [ECG] [EKG]: Secondary | ICD-10-CM

## 2022-12-17 DIAGNOSIS — E0590 Thyrotoxicosis, unspecified without thyrotoxic crisis or storm: Secondary | ICD-10-CM | POA: Diagnosis not present

## 2022-12-17 DIAGNOSIS — I4891 Unspecified atrial fibrillation: Secondary | ICD-10-CM

## 2022-12-17 DIAGNOSIS — G4733 Obstructive sleep apnea (adult) (pediatric): Secondary | ICD-10-CM

## 2022-12-17 NOTE — Telephone Encounter (Signed)
Placed order for zio monitor.  

## 2022-12-17 NOTE — Telephone Encounter (Signed)
-----   Message from Laurann Montana, [place] sent at 12/17/2022 12:35 PM EDT ----- Please set up Zio as requested - (see 7/8 Phone Note titled Results). Thank you! ----- Message ----- From: Tarri Fuller, CMA Sent: 12/17/2022  12:30 PM EDT To: Laurann Montana, PA-C; Reesa Chew, CMA

## 2022-12-17 NOTE — Progress Notes (Unsigned)
Enrolled for [employer] to mail a ZIO XT long term holter monitor to the patients address on file.   Dr. Nahser to read. 

## 2022-12-18 ENCOUNTER — Other Ambulatory Visit (HOSPITAL_COMMUNITY): Payer: Self-pay | Admitting: Nurse Practitioner

## 2022-12-18 ENCOUNTER — Telehealth: Payer: Self-pay | Admitting: *Deleted

## 2022-12-18 DIAGNOSIS — E0590 Thyrotoxicosis, unspecified without thyrotoxic crisis or storm: Secondary | ICD-10-CM

## 2022-12-18 NOTE — Progress Notes (Signed)
  Care Coordination Note  12/18/2022 Name: SUSEN HASKEW MRN: [id number] DOB: 02/23/1946  Emmie TANNYA GONET is a 77 y.o. year old female who is a primary care patient of Hindel, Leah, MD and is actively engaged with the care management team. I reached out to [employer] by phone today to assist with re-scheduling an initial visit with the RN Case Manager  Follow up plan: Patient is no longer a patient with [facility] per daughter and has home visit with her provider Dr Darleene Cleaver. Appropriate care team members and provider have been notified via electronic communication.   [facility]  Care Coordination Care Guide  Direct Dial: [phone]

## 2022-12-19 ENCOUNTER — Telehealth: Payer: Self-pay

## 2022-12-19 NOTE — Telephone Encounter (Signed)
-----   Message from Armanda Magic sent at 12/16/2022  8:23 AM EDT ----- Please let patient know that they have sleep apnea.  Recommend therapeutic CPAP titration for treatment of patient's sleep disordered breathing.  If unable to perform an in lab titration then initiate ResMed auto CPAP from 4 to 15cm H2O with heated humidity and mask of choice and overnight pulse ox on CPAP.

## 2022-12-19 NOTE — Telephone Encounter (Signed)
Left VM, per DPR. Left callback number for patient to receive sleep study results and recommendations.

## 2022-12-20 DIAGNOSIS — G4733 Obstructive sleep apnea (adult) (pediatric): Secondary | ICD-10-CM

## 2022-12-20 DIAGNOSIS — I4891 Unspecified atrial fibrillation: Secondary | ICD-10-CM

## 2022-12-20 DIAGNOSIS — R9431 Abnormal electrocardiogram [ECG] [EKG]: Secondary | ICD-10-CM | POA: Diagnosis not present

## 2022-12-24 ENCOUNTER — Ambulatory Visit: Payer: PPO | Admitting: Audiologist

## 2022-12-25 DIAGNOSIS — M810 Age-related osteoporosis without current pathological fracture: Secondary | ICD-10-CM | POA: Diagnosis not present

## 2023-01-01 DIAGNOSIS — E0590 Thyrotoxicosis, unspecified without thyrotoxic crisis or storm: Secondary | ICD-10-CM | POA: Diagnosis not present

## 2023-01-07 ENCOUNTER — Ambulatory Visit: Payer: PPO | Admitting: Podiatry

## 2023-01-07 DIAGNOSIS — I509 Heart failure, unspecified: Secondary | ICD-10-CM | POA: Diagnosis not present

## 2023-01-07 DIAGNOSIS — M79675 Pain in left toe(s): Secondary | ICD-10-CM | POA: Diagnosis not present

## 2023-01-07 DIAGNOSIS — B351 Tinea unguium: Secondary | ICD-10-CM | POA: Diagnosis not present

## 2023-01-07 DIAGNOSIS — I130 Hypertensive heart and chronic kidney disease with heart failure and stage 1 through stage 4 chronic kidney disease, or unspecified chronic kidney disease: Secondary | ICD-10-CM | POA: Diagnosis not present

## 2023-01-07 DIAGNOSIS — M79674 Pain in right toe(s): Secondary | ICD-10-CM | POA: Diagnosis not present

## 2023-01-07 DIAGNOSIS — I4891 Unspecified atrial fibrillation: Secondary | ICD-10-CM | POA: Diagnosis not present

## 2023-01-07 DIAGNOSIS — R9431 Abnormal electrocardiogram [ECG] [EKG]: Secondary | ICD-10-CM | POA: Diagnosis not present

## 2023-01-07 NOTE — Progress Notes (Signed)
  Subjective:  Patient ID: Leslie Dean, female    DOB: 04/04/1946,  MRN: [id number]  [name] presents to clinic today for: painful elongated mycotic toenails 1-5 bilaterally which are tender when wearing enclosed shoe gear. Pain is relieved with periodic professional debridement. Her daughter is present during today's visit. Chief Complaint  Patient presents with   Nail Problem    RFC    PCP is Hindel, Leah, MD.  Allergies  Allergen Reactions   Albuterol Sulfate    Hydromorphone Hcl    Other     Says narcotics cause nausea for her    Review of Systems: Negative except as noted in the HPI.  Objective: No changes noted in today's physical examination. There were no vitals filed for this visit.  Kendal SHAWNELLE BOUKNIGHT is a pleasant 77 y.o. female in NAD. AAO x 3.  Vascular Examination: Capillary refill time <3 seconds b/l LE. Palpable pedal pulses b/l LE. Digital hair sparse b/l. No pedal edema b/l. Skin temperature gradient WNL b/l. No varicosities b/l. Marland Kitchen  Dermatological Examination: Pedal skin with normal turgor, texture and tone b/l. No open wounds. No interdigital macerations b/l. Toenails 1-5 b/l thickened, discolored, dystrophic with subungual debris. There is pain on palpation to dorsal aspect of nailplates.  Neurological Examination: Protective sensation diminished with 10g monofilament b/l.  Musculoskeletal Examination: Muscle strength 5/5 to all lower extremity muscle groups bilaterally. No pain, crepitus or joint limitation noted with ROM bilateral LE. Utilizes wheelchair for mobility assistance.  Assessment/Plan: 1. Pain due to onychomycosis of toenails of both feet    Patient was evaluated and treated. All patient's and/or POA's questions/concerns addressed on today's visit. Toenails 1-5 debrided in length and girth without incident. Continue soft, supportive shoe gear daily. Report any pedal injuries to medical professional. Call office if there are any  questions/concerns. -Patient/POA to call should there be question/concern in the interim.   Return in about 3 months (around 04/09/2023).  Freddie Breech, DPM

## 2023-01-09 NOTE — Progress Notes (Signed)
  Cardiology Office Note:  .   Date:  01/10/2023  ID:  Leslie Dean, DOB February 12, 1946, MRN [id number] PCP: Lorayne Bender, MD  [facility] Providers Cardiologist:  Kristeen Miss, MD    History of Present Illness: .   Leslie Dean is a 77 y.o. female , former pt of Dr. Sharyn Lull, I met her at the hospital on November 11, 2022 when she was admitted with CHF symptoms  Echo from June, 2024 showed  normal LVEF of 55-60%, grade I DD, moderately dilated pulmonary CTA of chest was negative for PE  Had been eating a very high salt diet ( processed meats, chinese take out)   Event monitor from July 30 shows rare episodes of NSVT and occasional episodes of nonsustained SVT  These are generally very brief  Rare PACs, PVCs  No CP ,  Some dyspnea when she tries to walk   TSH has been found to low.  Is starting  tapazole today  She does not want to get the repeat echo   Is off lasix ( due to hypotension)     ROS:   Studies Reviewed: .         Risk Assessment/Calculations:         STOP-Bang Score:  3      Physical Exam:   VS:  BP 100/64   Pulse 77   Ht 5\' 5"  (1.651 m)   Wt 201 lb (91.2 kg)   SpO2 96%   BMI 33.45 kg/m    Wt Readings from Last 3 Encounters:  01/10/23 201 lb (91.2 kg)  11/26/22 193 lb (87.5 kg)  11/13/22 199 lb 1.2 oz (90.3 kg)    GEN: Well nourished, well developed in no acute distress NECK: No JVD; No carotid bruits CARDIAC:   RR  RESPIRATORY:   few basilar rales.  ABDOMEN: Soft, non-tender, non-distended EXTREMITIES:  No edema; No deformity   ASSESSMENT AND PLAN: .        1.  Acute on chronic diastolic congestive heart failure: She had an echocardiogram in June, 2024.  She was found to have normal left ventricular systolic function.  She had grade 1 diastolic dysfunction.  Small pericardial effusion was present.  Mild aortic insufficiency.  Will continue to hold lasix.  Her bp is low and I suspect lasix will worsen her hypotension  Gave her advice  on the Lounge Doctor leg rest.   2.  Small pericardial effusion:   stable .   She did not want to have the repeat echo.  Will cancel for now  She is not having any clinical symptoms to suggest that it has grown        Dispo: will see her in 6 months, keep appt with Annabelle Harman in 3 months   Signed, Kristeen Miss, MD

## 2023-01-10 ENCOUNTER — Ambulatory Visit: Payer: PPO | Admitting: Cardiovascular Disease

## 2023-01-10 ENCOUNTER — Encounter: Payer: Self-pay | Admitting: Cardiovascular Disease

## 2023-01-10 VITALS — BP 100/64 | HR 77 | Ht 65.0 in | Wt 201.0 lb

## 2023-01-10 DIAGNOSIS — I5033 Acute on chronic diastolic (congestive) heart failure: Secondary | ICD-10-CM | POA: Diagnosis not present

## 2023-01-10 NOTE — Patient Instructions (Addendum)
Medication Instructions:  Your physician recommends that you continue on your current medications as directed. Please refer to the Current Medication list given to you today.  *If you need a refill on your cardiac medications before your next appointment, please call your pharmacy*   Lab Work: NONE If you have labs (blood work) drawn today and your tests are completely normal, you will receive your results only by: MyChart Message (if you have MyChart) OR A paper copy in the mail If you have any lab test that is abnormal or we need to change your treatment, we will call you to review the results.   Testing/Procedures: NONE   Follow-Up: At [facility], you and your health needs are our priority.  As part of our continuing mission to provide you with exceptional heart care, we have created designated Provider Care Teams.  These Care Teams include your primary Cardiologist (physician) and Advanced Practice Providers (APPs -  Physician Assistants and Nurse Practitioners) who all work together to provide you with the care you need, when you need it.  We recommend signing up for the patient portal called "MyChart".  Sign up information is provided on this After Visit Summary.  MyChart is used to connect with patients for Virtual Visits (Telemedicine).  Patients are able to view lab/test results, encounter notes, upcoming appointments, etc.  Non-urgent messages can be sent to your provider as well.   To learn more about what you can do with MyChart, go to [email or web address].    Your next appointment:   Keep upcoming appt  Provider:   Kristeen Miss, MD       For your  leg edema you  should do  the following 1. Leg elevation - I recommend the Lounge Dr. Leg rest.  See below for details  2. Salt restriction  -  Use potassium chloride instead of regular salt as a salt substitute. 3. Walk regularly 4. Compression hose - Medical Supply store  5. Weight loss    Available on  [email or web address] Or  Go to [email or web address]

## 2023-01-12 ENCOUNTER — Encounter: Payer: Self-pay | Admitting: Podiatry

## 2023-01-15 NOTE — Telephone Encounter (Signed)
The patient has been notified of the result. Left detailed message on voicemail and informed patient to call back..Dahmir Epperly Green, CMA   

## 2023-01-20 NOTE — Telephone Encounter (Signed)
Per DPR spoke with patients daughter and she has declined to try the cpap device.

## 2023-01-27 DIAGNOSIS — M533 Sacrococcygeal disorders, not elsewhere classified: Secondary | ICD-10-CM | POA: Diagnosis not present

## 2023-01-31 DIAGNOSIS — H04123 Dry eye syndrome of bilateral lacrimal glands: Secondary | ICD-10-CM | POA: Diagnosis not present

## 2023-01-31 DIAGNOSIS — I509 Heart failure, unspecified: Secondary | ICD-10-CM | POA: Diagnosis not present

## 2023-01-31 DIAGNOSIS — I130 Hypertensive heart and chronic kidney disease with heart failure and stage 1 through stage 4 chronic kidney disease, or unspecified chronic kidney disease: Secondary | ICD-10-CM | POA: Diagnosis not present

## 2023-01-31 DIAGNOSIS — H5203 Hypermetropia, bilateral: Secondary | ICD-10-CM | POA: Diagnosis not present

## 2023-02-01 DIAGNOSIS — I10 Essential (primary) hypertension: Secondary | ICD-10-CM | POA: Diagnosis not present

## 2023-02-01 DIAGNOSIS — I509 Heart failure, unspecified: Secondary | ICD-10-CM | POA: Diagnosis not present

## 2023-02-01 DIAGNOSIS — Z7189 Other specified counseling: Secondary | ICD-10-CM | POA: Diagnosis not present

## 2023-02-01 DIAGNOSIS — M4800 Spinal stenosis, site unspecified: Secondary | ICD-10-CM | POA: Diagnosis not present

## 2023-02-01 DIAGNOSIS — M810 Age-related osteoporosis without current pathological fracture: Secondary | ICD-10-CM | POA: Diagnosis not present

## 2023-02-02 NOTE — Patient Instructions (Signed)
Leslie Dean , Thank you for taking time to come for your Medicare Wellness Visit. I appreciate your ongoing commitment to your health goals. Please review the following plan we discussed and let me know if I can assist you in the future.   Referrals/Orders/Follow-Ups/Clinician Recommendations: Aim for 30 minutes of exercise or brisk walking, 6-8 glasses of water, and 5 servings of fruits and vegetables each day.  This is a list of the screening recommended for you and due dates:  Health Maintenance  Topic Date Due   DTaP/Tdap/Td vaccine (1 - Tdap) Never done   Zoster (Shingles) Vaccine (1 of 2) Never done   DEXA scan (bone density measurement)  Never done   COVID-19 Vaccine (4 - 2023-24 season) 02/08/2022   Flu Shot  01/09/2023   Medicare Annual Wellness Visit  11/16/2023   Pneumonia Vaccine  Completed   Hepatitis C Screening  Completed   HPV Vaccine  Aged Out    Advanced directives: (ACP Link)Information on Advanced Care Planning can be found at [facility] Advance Health Care Directives Advance Health Care Directives ([email or web address]   Next Medicare Annual Wellness Visit scheduled for next year: Yes

## 2023-02-02 NOTE — Progress Notes (Unsigned)
Subjective:   Leslie Dean is a 77 y.o. female who presents for an Initial Medicare Annual Wellness Visit.  Visit Complete: {VISITMETHOD:[id number]}  Patient Medicare AWV questionnaire was completed by the patient on ***; I have confirmed that all information answered by patient is correct and no changes since this date.  Vital Signs: {telehealth vitals:30100}  Review of Systems    ***       Objective:    There were no vitals filed for this visit. There is no height or weight on file to calculate BMI.     11/12/2022    8:00 PM 04/10/2022    9:16 AM 03/27/2022   10:28 AM 03/11/2022    9:32 AM 05/11/2021   10:58 AM 06/19/2020    3:16 PM 06/08/2020    2:23 PM  Advanced Directives  Does Patient Have a Medical Advance Directive? No No No No No Yes Yes  Type of Advance Directive      Living will Healthcare Power of [place];Living will  Does patient want to make changes to medical advance directive?      No - Patient declined   Copy of Healthcare Power of Attorney in Chart?      No - copy requested   Would patient like information on creating a medical advance directive? No - Patient declined  No - Patient declined No - Patient declined No - Patient declined No - Patient declined     Current Medications (verified) Outpatient Encounter Medications as of 02/03/2023  Medication Sig   acetaminophen (TYLENOL) 325 MG tablet Take 2 tablets (650 mg total) by mouth every 6 (six) hours as needed for mild pain or moderate pain.   aspirin 81 MG chewable tablet Chew by mouth.   atorvastatin (LIPITOR) 40 MG tablet TAKE 1 TABLET(40 MG) BY MOUTH DAILY (Patient taking differently: Take 40 mg by mouth daily.)   Calcium Carb-Cholecalciferol (CALCIUM-VITAMIN D) 600-400 MG-UNIT TABS Take 1 tablet by mouth daily.   Cholecalciferol (VITAMIN D3) 50 MCG (2000 UT) capsule Take 2,000 Units by mouth daily.   ferrous sulfate 325 (65 FE) MG tablet Take 325 mg by mouth daily with breakfast.   furosemide  (LASIX) 40 MG tablet Take 1 tablet (40 mg total) by mouth daily.   Magnesium Chloride-Calcium 64-106 MG TBEC Take 1 tablet by mouth daily.   metoprolol succinate (TOPROL-XL) 50 MG 24 hr tablet Take 50 mg by mouth daily. Take with or immediately following a meal.   pregabalin (LYRICA) 75 MG capsule Take 75 mg by mouth at bedtime.   PROLIA 60 MG/ML SOSY injection Inject into the skin.   No facility-administered encounter medications on file as of 02/03/2023.    Allergies (verified) Albuterol sulfate, Hydromorphone hcl, and Other   History: Past Medical History:  Diagnosis Date   Hepatic cyst 12/03/2012   Hypertension    Scoliosis    Spinal stenosis    Stroke (HCC)    03/24/2020   Past Surgical History:  Procedure Laterality Date   CESAREAN SECTION     CHOLECYSTECTOMY N/A 12/04/2012   Procedure: LAPAROSCOPIC Liver Cyst Unroofing and removal of gallbladder;  Surgeon: Almond Lint, MD;  Location: [facility];  Service: General;  Laterality: N/A;   CYST EXCISION     liver cyst removal   EYE SURGERY     FEMUR IM NAIL Left 10/18/2019   Procedure: INTRAMEDULLARY (IM) NAIL FEMORAL;  Surgeon: Durene Romans, MD;  Location: [facility];  Service: Orthopedics;  Laterality: Left;  Family History  Problem Relation Age of Onset   Marfan syndrome Grandchild    Social History   Socioeconomic History   Marital status: Divorced    Spouse name: Not on file   Number of children: Not on file   Years of education: Not on file   Highest education level: Not on file  Occupational History   Not on file  Tobacco Use   Smoking status: Former    Current packs/day: 0.00    Types: Cigarettes    Quit date: 10/09/2019    Years since quitting: 3.3    Passive exposure: Past   Smokeless tobacco: Never  Substance and Sexual Activity   Alcohol use: No   Drug use: No   Sexual activity: Not on file  Other Topics Concern   Not on file  Social History Narrative   Lives with daughter and grandaughter   Right  Handed   Drinks 1-2 cups caffeine daily   Social Determinants of Health   Financial Resource Strain: Not on file  Food Insecurity: No Food Insecurity (11/15/2022)   Hunger Vital Sign    Worried About Running Out of Food in the Last Year: Never true    Ran Out of Food in the Last Year: Never true  Transportation Needs: No Transportation Needs (11/15/2022)   PRAPARE - Administrator, Civil Service (Medical): No    Lack of Transportation (Non-Medical): No  Physical Activity: Not on file  Stress: Not on file  Social Connections: Not on file    Tobacco Counseling Counseling given: Not Answered   Clinical Intake:                        Activities of Daily Living    11/12/2022    3:00 PM  In your present state of health, do you have any difficulty performing the following activities:  Hearing? 0  Vision? 0  Difficulty concentrating or making decisions? 0  Walking or climbing stairs? 1  Dressing or bathing? 0  Doing errands, shopping? 0    Patient Care Team: Lorayne Bender, MD as PCP - General Nahser, Deloris Ping, MD as PCP - Cardiology (Cardiology) Annita Brod, MD as PCP - Internal Medicine (Internal Medicine)  Indicate any recent Medical Services you may have received from other than [facility] providers in the past year (date may be approximate).     Assessment:   This is a routine wellness examination for Leslie Dean.  Hearing/Vision screen No results found.  Dietary issues and exercise activities discussed:     Goals Addressed   None    Depression Screen    06/27/2022   11:23 AM 03/27/2022   10:26 AM 03/11/2022    9:32 AM 05/11/2021   10:57 AM 06/19/2020    4:18 PM 06/19/2020    3:16 PM 05/29/2020    1:51 PM  PHQ 2/9 Scores  PHQ - 2 Score 1 4 3 2 2 2 2   PHQ- 9 Score 5 17 17 14 11 8 13     Fall Risk    04/10/2022    9:16 AM 03/27/2022   10:26 AM 03/11/2022    9:34 AM 05/11/2021   10:58 AM 06/19/2020    3:16 PM  Fall Risk   Falls in the past  year? 0 0 0 0 1  Number falls in past yr:  0 0 0 0  Injury with Fall?  0 0 0 1  Risk for fall due  to :   Impaired mobility;Impaired balance/gait      MEDICARE RISK AT HOME:    TIMED UP AND GO:  Was the test performed? No    Cognitive Function:        Immunizations Immunization History  Administered Date(s) Administered   Fluad Quad(high Dose 65+) 03/11/2022   Influenza-Unspecified 02/09/2020   PFIZER(Purple Top)SARS-COV-2 Vaccination 07/06/2019, 07/27/2019, 04/12/2020   Pneumococcal Conjugate-13 04/01/2016, 03/12/2018   Pneumococcal Polysaccharide-23 03/20/2017    {TDAP status:2101805}  {Flu Vaccine status:2101806}  Pneumococcal vaccine status: Up to date  Covid-19 vaccine status: Information provided on how to obtain vaccines.   Qualifies for Shingles Vaccine? Yes   Zostavax completed No   Shingrix Completed?: No.    Education has been provided regarding the importance of this vaccine. Patient has been advised to call insurance company to determine out of pocket expense if they have not yet received this vaccine. Advised may also receive vaccine at local pharmacy or Health Dept. Verbalized acceptance and understanding.  Screening Tests Health Maintenance  Topic Date Due   DTaP/Tdap/Td (1 - Tdap) Never done   Zoster Vaccines- Shingrix (1 of 2) Never done   DEXA SCAN  Never done   COVID-19 Vaccine (4 - 2023-24 season) 02/08/2022   INFLUENZA VACCINE  01/09/2023   Medicare Annual Wellness (AWV)  11/16/2023   Pneumonia Vaccine 65+ Years old  Completed   Hepatitis C Screening  Completed   HPV VACCINES  Aged Out    Health Maintenance  Health Maintenance Due  Topic Date Due   DTaP/Tdap/Td (1 - Tdap) Never done   Zoster Vaccines- Shingrix (1 of 2) Never done   DEXA SCAN  Never done   COVID-19 Vaccine (4 - 2023-24 season) 02/08/2022   INFLUENZA VACCINE  01/09/2023    Colorectal cancer screening: No longer required.   Mammogram status: No longer required due  to age and preference.  {Bone Density status:21018021}  Lung Cancer Screening: (Low Dose CT Chest recommended if Age 34-80 years, 20 pack-year currently smoking OR have quit w/in 15years.) does not qualify.   Lung Cancer Screening Referral: n/a  Additional Screening:  Hepatitis C Screening: does qualify; Completed 12/08/20  Vision Screening: Recommended annual ophthalmology exams for early detection of glaucoma and other disorders of the eye. Is the patient up to date with their annual eye exam?  {YES/NO:21197} Who is the provider or what is the name of the office in which the patient attends annual eye exams? *** If pt is not established with a provider, would they like to be referred to a provider to establish care? {YES/NO:21197}.   Dental Screening: Recommended annual dental exams for proper oral hygiene  Community Resource Referral / Chronic Care Management: CRR required this visit?  {YES/NO:21197}  CCM required this visit?  {CCM Required [phone]}     Plan:     I have personally reviewed and noted the following in the patient's chart:   Medical and social history Use of alcohol, tobacco or illicit drugs  Current medications and supplements including opioid prescriptions. {Opioid Prescriptions:[phone]} Functional ability and status Nutritional status Physical activity Advanced directives List of other physicians Hospitalizations, surgeries, and ER visits in previous 12 months Vitals Screenings to include cognitive, depression, and falls Referrals and appointments  In addition, I have reviewed and discussed with patient certain preventive protocols, quality metrics, and best practice recommendations. A written personalized care plan for preventive services as well as general preventive health recommendations were provided to patient.  Kandis Fantasia [place], [place]   1/61/0960   After Visit Summary: {CHL AMB AWV After Visit  [phone]}  Nurse Notes: ***

## 2023-02-04 DIAGNOSIS — I130 Hypertensive heart and chronic kidney disease with heart failure and stage 1 through stage 4 chronic kidney disease, or unspecified chronic kidney disease: Secondary | ICD-10-CM | POA: Diagnosis not present

## 2023-02-04 DIAGNOSIS — E0590 Thyrotoxicosis, unspecified without thyrotoxic crisis or storm: Secondary | ICD-10-CM | POA: Diagnosis not present

## 2023-02-04 DIAGNOSIS — I509 Heart failure, unspecified: Secondary | ICD-10-CM | POA: Diagnosis not present

## 2023-02-12 ENCOUNTER — Telehealth: Payer: Self-pay | Admitting: Family Medicine

## 2023-02-12 NOTE — Telephone Encounter (Signed)
Cancellation reason for 02/03/2023 AWV-"(States that she has a new pcp) .

## 2023-02-13 ENCOUNTER — Encounter (HOSPITAL_COMMUNITY)
Admission: RE | Admit: 2023-02-13 | Discharge: 2023-02-13 | Disposition: A | Discharge status: Home / Self Care | Payer: PPO | Source: Ambulatory Visit | Attending: Nurse Practitioner

## 2023-02-13 ENCOUNTER — Encounter (HOSPITAL_COMMUNITY)
Admission: RE | Admit: 2023-02-13 | Discharge: 2023-02-13 | Disposition: A | Discharge status: Home / Self Care | Payer: PPO | Source: Ambulatory Visit | Attending: Nurse Practitioner | Admitting: Nurse Practitioner

## 2023-02-13 DIAGNOSIS — E0590 Thyrotoxicosis, unspecified without thyrotoxic crisis or storm: Secondary | ICD-10-CM

## 2023-02-13 MED ORDER — SODIUM IODIDE I-123 7.4 MBQ CAPS
474.0000 | ORAL_CAPSULE | Freq: Once | ORAL | Status: AC
  Administered 2023-02-13: 474 via ORAL

## 2023-02-14 ENCOUNTER — Ambulatory Visit (HOSPITAL_COMMUNITY)
Admission: RE | Admit: 2023-02-14 | Discharge: 2023-02-14 | Disposition: A | Discharge status: Home / Self Care | Payer: PPO | Source: Ambulatory Visit | Attending: Nurse Practitioner | Admitting: Nurse Practitioner

## 2023-02-14 DIAGNOSIS — E049 Nontoxic goiter, unspecified: Secondary | ICD-10-CM | POA: Diagnosis not present

## 2023-02-14 DIAGNOSIS — E0590 Thyrotoxicosis, unspecified without thyrotoxic crisis or storm: Secondary | ICD-10-CM | POA: Insufficient documentation

## 2023-02-14 NOTE — Telephone Encounter (Signed)
Reached out to the patient to clarify what test she wanted and she states she does not want a sleep study at all.

## 2023-02-17 DIAGNOSIS — E0590 Thyrotoxicosis, unspecified without thyrotoxic crisis or storm: Secondary | ICD-10-CM | POA: Diagnosis not present

## 2023-02-26 ENCOUNTER — Other Ambulatory Visit: Payer: Self-pay | Admitting: Nurse Practitioner

## 2023-02-26 DIAGNOSIS — E041 Nontoxic single thyroid nodule: Secondary | ICD-10-CM

## 2023-02-27 ENCOUNTER — Other Ambulatory Visit (HOSPITAL_COMMUNITY): Payer: PPO

## 2023-02-28 DIAGNOSIS — M4800 Spinal stenosis, site unspecified: Secondary | ICD-10-CM | POA: Diagnosis not present

## 2023-02-28 DIAGNOSIS — I10 Essential (primary) hypertension: Secondary | ICD-10-CM | POA: Diagnosis not present

## 2023-02-28 DIAGNOSIS — I509 Heart failure, unspecified: Secondary | ICD-10-CM | POA: Diagnosis not present

## 2023-02-28 DIAGNOSIS — N390 Urinary tract infection, site not specified: Secondary | ICD-10-CM | POA: Diagnosis not present

## 2023-03-11 ENCOUNTER — Ambulatory Visit: Payer: PPO | Admitting: Physician Assistant

## 2023-03-28 DIAGNOSIS — M533 Sacrococcygeal disorders, not elsewhere classified: Secondary | ICD-10-CM | POA: Diagnosis not present

## 2023-04-05 DIAGNOSIS — I509 Heart failure, unspecified: Secondary | ICD-10-CM | POA: Diagnosis not present

## 2023-04-05 DIAGNOSIS — I130 Hypertensive heart and chronic kidney disease with heart failure and stage 1 through stage 4 chronic kidney disease, or unspecified chronic kidney disease: Secondary | ICD-10-CM | POA: Diagnosis not present

## 2023-04-08 DIAGNOSIS — I509 Heart failure, unspecified: Secondary | ICD-10-CM | POA: Diagnosis not present

## 2023-04-08 DIAGNOSIS — M810 Age-related osteoporosis without current pathological fracture: Secondary | ICD-10-CM | POA: Diagnosis not present

## 2023-04-08 DIAGNOSIS — M4800 Spinal stenosis, site unspecified: Secondary | ICD-10-CM | POA: Diagnosis not present

## 2023-04-08 DIAGNOSIS — I10 Essential (primary) hypertension: Secondary | ICD-10-CM | POA: Diagnosis not present

## 2023-04-16 ENCOUNTER — Ambulatory Visit: Payer: PPO | Admitting: Podiatry

## 2023-04-22 ENCOUNTER — Encounter: Payer: Self-pay | Admitting: Podiatry

## 2023-04-22 ENCOUNTER — Ambulatory Visit: Payer: PPO | Admitting: Podiatry

## 2023-04-22 DIAGNOSIS — M79675 Pain in left toe(s): Secondary | ICD-10-CM

## 2023-04-22 DIAGNOSIS — M79674 Pain in right toe(s): Secondary | ICD-10-CM | POA: Diagnosis not present

## 2023-04-22 DIAGNOSIS — B351 Tinea unguium: Secondary | ICD-10-CM | POA: Diagnosis not present

## 2023-04-24 NOTE — Progress Notes (Signed)
  Subjective:  Patient ID: Leslie Dean, female    DOB: [birth year],  MRN: [id number]  77 y.o. female presents painful thick toenails that are difficult to trim. Pain interferes with ambulation. Aggravating factors include wearing enclosed shoe gear. Pain is relieved with periodic professional debridement.  Chief Complaint  Patient presents with   ROUTINE FOOT CARE    RFC PATIENT STATES THAT THE LAST TIME SHE SEEN HER PCP WAS TWO WEEKS AGO.  AND SAW A HOME HEALTH NURSE ABOUT A WEEK AGO     New problem(s): None   PCP is Annita Brod, MD , and last visit was April 08, 2023.  Allergies  Allergen Reactions   Albuterol Other (See Comments)   Albuterol Sulfate    Hydromorphone Hcl    Other     Says narcotics cause nausea for her    Review of Systems: Negative except as noted in the HPI.   Objective:  Leslie Dean is a pleasant 77 y.o. female in NAD. AAO x 3.  Vascular Examination: Vascular status intact b/l with palpable pedal pulses. CFT immediate b/l. Pedal hair present. No edema. No pain with calf compression b/l. Skin temperature gradient WNL b/l. No varicosities noted. No cyanosis or clubbing noted.  Neurological Examination: Sensation absent b/l with 10 gram monofilament.   Dermatological Examination: Pedal skin with normal turgor, texture and tone b/l. No open wounds nor interdigital macerations noted. Toenails 1-5 b/l thick, discolored, elongated with subungual debris and pain on dorsal palpation. No hyperkeratotic lesions noted b/l.   Musculoskeletal Examination: Muscle strength 5/5 to b/l LE.  No pain, crepitus noted b/l. No gross pedal deformities. Patient in wheelchair today.  Radiographs: None  Last A1c:       No data to display           Assessment:   1. Pain due to onychomycosis of toenails of both feet    Plan:  -Consent given for treatment as described below: -Examined patient. -Continue supportive shoe gear daily. -Mycotic toenails 1-5  bilaterally were debrided in length and girth with sterile nail nippers and dremel without incident. -Patient/POA to call should there be question/concern in the interim.  Return in about 3 months (around 07/23/2023).  Freddie Breech, DPM

## 2023-04-28 DIAGNOSIS — M5416 Radiculopathy, lumbar region: Secondary | ICD-10-CM | POA: Diagnosis not present

## 2023-05-20 DIAGNOSIS — M533 Sacrococcygeal disorders, not elsewhere classified: Secondary | ICD-10-CM | POA: Diagnosis not present

## 2023-05-20 DIAGNOSIS — M5416 Radiculopathy, lumbar region: Secondary | ICD-10-CM | POA: Diagnosis not present

## 2023-05-21 DIAGNOSIS — E0590 Thyrotoxicosis, unspecified without thyrotoxic crisis or storm: Secondary | ICD-10-CM | POA: Diagnosis not present

## 2023-08-20 ENCOUNTER — Ambulatory Visit: Payer: PPO | Admitting: Podiatry

## 2023-08-20 ENCOUNTER — Encounter: Payer: Self-pay | Admitting: Podiatry

## 2023-08-20 VITALS — Ht 65.0 in | Wt 201.0 lb

## 2023-08-20 DIAGNOSIS — M79674 Pain in right toe(s): Secondary | ICD-10-CM | POA: Diagnosis not present

## 2023-08-20 DIAGNOSIS — B351 Tinea unguium: Secondary | ICD-10-CM

## 2023-08-20 DIAGNOSIS — M79675 Pain in left toe(s): Secondary | ICD-10-CM | POA: Diagnosis not present

## 2023-08-24 NOTE — Progress Notes (Signed)
  Subjective:  Patient ID: Leslie Dean, female    DOB: [birth year],  MRN: [id number]  78 y.o. female presents to clinic with  painful, elongated thickened toenails x 10 which are symptomatic when wearing enclosed shoe gear. This interferes with his/her daily activities.  Chief Complaint  Patient presents with   Nail Problem    Pt is here for [facility] PCP is Dr Jacqulynn Cadet and LOV was in October.   New problem(s): None   PCP is Annita Brod, MD.  Allergies  Allergen Reactions   Albuterol Other (See Comments)   Albuterol Sulfate    Hydromorphone Hcl    Other     Says narcotics cause nausea for her    Review of Systems: Negative except as noted in the HPI.   Objective:  Leslie Dean is a pleasant 78 y.o. female in NAD. AAO x 3.  Vascular Examination: Vascular status intact b/l with palpable pedal pulses. CFT immediate b/l. No edema. No pain with calf compression b/l. Skin temperature gradient WNL b/l. No varicosities noted. No cyanosis or clubbing noted b/l LE.  Neurological Examination: Protective sensation diminished with 10g monofilament b/l.  Dermatological Examination: Pedal skin with normal turgor, texture and tone b/l. Toenails 1-5 b/l thick, discolored, elongated with subungual debris and pain on dorsal palpation.  No corns, calluses nor porokeratotic lesions noted.  Musculoskeletal Examination: Muscle strength 5/5 to b/l LE. No pain, crepitus or joint limitation noted with ROM bilateral LE. No gross bony deformities bilaterally. Utilizes rollator for ambulation assistance.  Radiographs: None  Last A1c:       No data to display           Assessment:   1. Pain due to onychomycosis of toenails of both feet    Plan:  Patient was evaluated and treated. All patient's and/or POA's questions/concerns addressed on today's visit. Toenails 1-5 debrided in length and girth without incident. Continue soft, supportive shoe gear daily. Report any pedal injuries to medical  professional. Call office if there are any questions/concerns. -Patient/POA to call should there be question/concern in the interim.  Return in about 3 months (around 11/20/2023).  Freddie Breech, DPM      [place] LOCATION: [address] [place] [address]                   Office [phone]   [facility] LOCATION: [address] [place], [place] [address] Office [phone]

## 2023-12-09 ENCOUNTER — Encounter: Payer: Self-pay | Admitting: Podiatry

## 2023-12-09 ENCOUNTER — Ambulatory Visit: Admitting: Podiatry

## 2023-12-09 DIAGNOSIS — M79674 Pain in right toe(s): Secondary | ICD-10-CM | POA: Diagnosis not present

## 2023-12-09 DIAGNOSIS — M79675 Pain in left toe(s): Secondary | ICD-10-CM

## 2023-12-09 DIAGNOSIS — B351 Tinea unguium: Secondary | ICD-10-CM

## 2023-12-11 NOTE — Progress Notes (Signed)
  Subjective:  Patient ID: Leslie Dean, female    DOB: 18-Dec-1945,  MRN: [id number]  Leslie Dean presents to clinic today for: painful thick toenails that are difficult to trim. Pain interferes with ambulation. Aggravating factors include wearing enclosed shoe gear. Pain is relieved with periodic professional debridement. She is accompanied by her daughter on today's visit.   PCP is Roy Harlene HERO, NP. [place] 08/21/2023.  Allergies  Allergen Reactions   Albuterol Other (See Comments)   Albuterol Sulfate    Hydromorphone  Hcl    Other     Says narcotics cause nausea for her    Review of Systems: Negative except as noted in the HPI.  Objective: No changes noted in today's physical examination. There were no vitals filed for this visit.  Leslie Dean is a pleasant 78 y.o. female in NAD. AAO x 3.  Vascular Examination: Capillary refill time <3 seconds b/l LE. Palpable pedal pulses b/l LE. Digital hair present b/l. No pedal edema b/l. Skin temperature gradient WNL b/l. No varicosities b/l. SABRA  Dermatological Examination: Pedal skin with normal turgor, texture and tone b/l. No open wounds. No interdigital macerations b/l. Toenails 1-5 b/l thickened, discolored, dystrophic with subungual debris. There is pain on palpation to dorsal aspect of nailplates. No hyperkeratotic nor porokeratotic lesions present on today's visit.SABRA  Neurological Examination: Protective sensation diminished with 10g monofilament b/l.  Musculoskeletal Examination: Muscle strength 5/5 to all lower extremity muscle groups bilaterally. No pain, crepitus or joint limitation noted with ROM bilateral LE. No gross bony deformities bilaterally.  Assessment/Plan: 1. Pain due to onychomycosis of toenails of both feet   Consent given for treatment. Patient examined. All patient's and/or POA's questions/concerns addressed on today's visit. Mycotic toenails 1-5 debrided in length and girth without incident. Continue  soft, supportive shoe gear daily. Report any pedal injuries to medical professional. Call office if there are any quesitons/concerns. -Patient/POA to call should there be question/concern in the interim.   Return in about 3 months (around 03/10/2024).  Delon LITTIE Merlin, DPM      [place] LOCATION: [address] [place] [address]                   Office [phone]   [facility] LOCATION: [address] [place], [place] [address] Office [phone]

## 2023-12-22 ENCOUNTER — Encounter (HOSPITAL_COMMUNITY): Payer: Self-pay

## 2023-12-22 ENCOUNTER — Ambulatory Visit (HOSPITAL_COMMUNITY)
Admission: EM | Admit: 2023-12-22 | Discharge: 2023-12-22 | Disposition: A | Discharge status: Home / Self Care | Attending: Internal Medicine | Admitting: Internal Medicine

## 2023-12-22 DIAGNOSIS — S51801A Unspecified open wound of right forearm, initial encounter: Secondary | ICD-10-CM

## 2023-12-22 DIAGNOSIS — Z23 Encounter for immunization: Secondary | ICD-10-CM

## 2023-12-22 DIAGNOSIS — W548XXA Other contact with dog, initial encounter: Secondary | ICD-10-CM

## 2023-12-22 MED ORDER — MUPIROCIN 2 % EX OINT: 1.0000 | TOPICAL_OINTMENT | Freq: Two times a day (BID) | CUTANEOUS | 0 refills | Status: AC

## 2023-12-22 MED ORDER — BACITRACIN ZINC 500 UNIT/GM EX OINT: TOPICAL_OINTMENT | Freq: Two times a day (BID) | CUTANEOUS | Status: DC

## 2023-12-22 MED ORDER — AMOXICILLIN-POT CLAVULANATE 875-125 MG PO TABS: 1.0000 | ORAL_TABLET | Freq: Two times a day (BID) | ORAL | 0 refills | Status: AC

## 2023-12-22 MED ORDER — TETANUS-DIPHTH-ACELL PERTUSSIS 5-2.5-18.5 LF-MCG/0.5 IM SUSY
0.5000 mL | PREFILLED_SYRINGE | Freq: Once | INTRAMUSCULAR | Status: AC
  Administered 2023-12-22: 0.5 mL via INTRAMUSCULAR

## 2023-12-22 MED ORDER — TETANUS-DIPHTH-ACELL PERTUSSIS 5-2.5-18.5 LF-MCG/0.5 IM SUSY
PREFILLED_SYRINGE | INTRAMUSCULAR | Status: AC
  Filled 2023-12-22: qty 0.5

## 2023-12-22 NOTE — ED Provider Notes (Signed)
 [facility]    CSN: [id number] Arrival date & time: 12/22/23  1555      History   Chief Complaint Chief Complaint  Patient presents with   animal scratch    HPI Leslie Dean is a 78 y.o. female.   Leslie Dean is a 78 y.o. female presenting for chief complaint of dog scratch to the right arm that happened last night.  Her 78 year old 70 pound lab mix dog was attempting to get onto the couch when he accidentally scratched the patient's right forearm.  Wound bled initially, bleeding controlled with pressure quickly and easily. She is concerned for infection and is unsure of date of last tetanus injection.  There is a skin tear to the right arm.  Denies numbness and tingling to the distal right arm, recent antibiotic or steroid use in the last 90 days, and drainage/redness surrounding the wound.  Wound is nontender unless it is palpated.  She has cleansed wound at home prior to arrival.  Dog is up-to-date on its rabies vaccinations.  She is not diabetic.     Past Medical History:  Diagnosis Date   Hepatic cyst 12/03/2012   Hypertension    Scoliosis    Spinal stenosis    Stroke (HCC)    03/24/2020    Patient Active Problem List   Diagnosis Date Noted   Congestive heart failure (HCC) 11/18/2022   History of transient ischemic attack 11/18/2022   CHF exacerbation (HCC) 11/11/2022   Achilles tendon tear, left, subsequent encounter 11/11/2022   Age-related osteoporosis with current pathological fracture 06/30/2022   Fatigue 04/10/2022   Spinal stenosis of lumbar region without neurogenic claudication 03/11/2022   Chronic pain of left knee 05/11/2021   Onychomycosis 06/08/2020   Lower extremity edema 05/29/2020   Bilateral ankle joint pain 05/24/2020   TIA (transient ischemic attack) 04/05/2020   Pain of left hip joint 11/25/2019   Pressure injury of skin 10/30/2019   Closed left hip fracture (HCC) 10/18/2019   Acute respiratory failure (HCC) 10/18/2019    HTN (hypertension) 12/03/2012   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      Home Medications    Prior to Admission medications   Medication Sig Start Date End Date Taking? Authorizing Provider  amoxicillin -clavulanate (AUGMENTIN ) 875-125 MG tablet Take 1 tablet by mouth every 12 (twelve) hours. 12/22/23  Yes Enedelia Dorna HERO, FNP  mupirocin  ointment (BACTROBAN ) 2 % Apply 1 Application topically 2 (two) times daily. 12/22/23  Yes Enedelia Dorna HERO, FNP  acetaminophen  (TYLENOL ) 325 MG tablet Take 2 tablets (650 mg total) by mouth every 6 (six) hours as needed for mild pain or moderate pain. 11/13/22   Alba Sharper, MD  aspirin  81 MG chewable tablet Chew by mouth.    [provider]  atorvastatin  (LIPITOR) 40 MG tablet TAKE 1 TABLET(40 MG) BY MOUTH DAILY Patient taking differently: Take 40 mg  by mouth daily. 07/25/22   Lilland, Alana, DO  Calcium  Carb-Cholecalciferol  (CALCIUM -VITAMIN D ) 600-400 MG-UNIT TABS Take 1 tablet by mouth daily.    [provider]  Cholecalciferol  (VITAMIN D3) 50 MCG (2000 UT) capsule Take 2,000 Units by mouth daily.    [provider]  ferrous sulfate  325 (65 FE) MG tablet Take 325 mg by mouth daily with breakfast.    [provider]  Magnesium  Chloride-Calcium  64-106 MG TBEC Take 1 tablet by mouth daily.    [provider]  metoprolol  succinate (TOPROL -XL) 50 MG 24 hr tablet Take 50 mg by  mouth daily. Take with or immediately following a meal.    [provider]  pregabalin  (LYRICA ) 75 MG capsule Take 75 mg by mouth at bedtime. 02/20/22   [provider]  PROLIA 60 MG/ML SOSY injection Inject into the skin. 11/02/21   [provider]    Family History Family History  Problem Relation Age of Onset   Marfan syndrome Grandchild     Social History Social History   Tobacco Use   Smoking status: Former    Current packs/day: 0.00    Types: Cigarettes    Quit date: 10/09/2019    Years since quitting: 4.2    Passive exposure: Past   Smokeless tobacco: Never  Substance Use Topics   Alcohol use: No   Drug use: No     Allergies   Albuterol, Albuterol sulfate, Hydromorphone  hcl, and Other   Review of Systems Review of Systems Per HPI  Physical Exam Triage Vital Signs ED Triage Vitals  Encounter Vitals Group     BP 12/22/23 1617 (!) 161/60     Girls Systolic BP Percentile --      Girls Diastolic BP Percentile --      Boys Systolic BP Percentile --      Boys Diastolic BP Percentile --      Pulse Rate 12/22/23 1617 75     Resp 12/22/23 1617 18     Temp 12/22/23 1617 98 F (36.7 C)     Temp Source 12/22/23 1617 Oral     SpO2 12/22/23 1617 91 %     Weight --      Height --      Head Circumference --      Peak Flow --      Pain Score 12/22/23 1618 5     Pain Loc --      Pain Education --      Exclude from Growth Chart --    No data found.  Updated Vital Signs BP (!) 161/60 (BP Location: Left Arm)   Pulse 75   Temp 98 F (36.7 C) (Oral)   Resp 18   SpO2 91%   Visual Acuity Right Eye Distance:   Left Eye Distance:   Bilateral Distance:    Right Eye Near:   Left Eye Near:    Bilateral Near:     Physical Exam Vitals and nursing note reviewed.  Constitutional:      Appearance: She is not ill-appearing or toxic-appearing.  HENT:     Head: Normocephalic and atraumatic.     Right Ear: Hearing and external ear normal.      Left Ear: Hearing and external ear normal.     Nose: Nose normal.     Mouth/Throat:     Lips: Pink.  Eyes:     General: Lids are normal. Vision grossly intact. Gaze aligned appropriately.     Extraocular  Movements: Extraocular movements intact.     Conjunctiva/sclera: Conjunctivae normal.  Pulmonary:     Effort: Pulmonary effort is normal.  Musculoskeletal:     Cervical back: Neck supple.  Skin:    General: Skin is warm and dry.     Capillary Refill: Capillary refill takes less than 2 seconds.     Findings: Abrasion (Skin tear to the right forearm as seen in image below) present. No rash.     Comments: Abrasion of right forearm as seen in image below.  Nonbleeding.  Slightly tender to palpation around the wound.  Nondraining.  +2 right radial pulse, 5/5 grip strength of right hand, sensation intact distally.  Ecchymosis surrounding wound.  Neurological:     General: No focal deficit present.     Mental Status: She is alert and oriented to person, place, and time. Mental status is at baseline.     Cranial Nerves: No dysarthria or facial asymmetry.  Psychiatric:        Mood and Affect: Mood normal.        Speech: Speech normal.        Behavior: Behavior normal.        Thought Content: Thought content normal.        Judgment: Judgment normal.    Abrasion to right forearm, dog scratch   UC Treatments / Results  Labs (all labs ordered are listed, but only abnormal results are displayed) Labs Reviewed - No data to display  EKG   Radiology No results found.  Procedures Procedures (including critical care time)  Medications Ordered in UC Medications  bacitracin  ointment (has no administration in time range)  Tdap (BOOSTRIX ) injection 0.5 mL (0.5 mLs Intramuscular Given 12/22/23 1729)    Initial Impression / Assessment and Plan / UC Course  I have reviewed the triage vital signs and the nursing notes.  Pertinent labs & imaging results that were available during my care  of the patient were reviewed by me and considered in my medical decision making (see chart for details).   1.  Dog scratch, open wound of lower arm, need for tetanus booster Animal is up to date on rabies vaccination, therefore no need for Kedrab/rabies vaccine series.   Imaging: No indication for imaging today based on stable musculoskeletal exam findings, neurovascularly intact distally to injury.  Antibiotic prophylaxis ordered to prevent infection to wound given mechanism of injury. Mupirocin  BID for 7 days. Wound care discussed.   Wound cleansed and dressed in clinic, discussed wound care for home and infection return precautions.    Tdap updated.  Counseled patient on potential for adverse effects with medications prescribed/recommended today, strict ER and return-to-clinic precautions discussed, patient verbalized understanding.    Final Clinical Impressions(s) / UC Diagnoses   Final diagnoses:  Dog scratch  Open wound of lower arm, right, initial encounter  Need for tetanus booster     Discharge Instructions      Dog bites are at high risk for infection.  Take antibiotic as prescribed for 7 days to treat/prevent infection to the bite wound.  Apply mupirocin  ointment to the wound every 12 hours for the next 7 days.   Cleanse the wound with warm soap and water every 12 hours, then cover with non-stick gauze and coban wrap for the next 7-10 days while healing.  Watch for worsening signs of infection such as fever, redness, swelling, warmth, or pain to the site.   If you develop any new or worsening symptoms or  if your symptoms do not start to improve, please return here or follow-up with your primary care provider. If your symptoms are severe, please go to the emergency room.     ED Prescriptions     Medication Sig Dispense Auth. Provider   amoxicillin -clavulanate (AUGMENTIN ) 875-125 MG tablet Take 1 tablet by mouth every 12 (twelve) hours. 14 tablet Gwendola Hornaday,  Starletta Houchin M, FNP   mupirocin  ointment (BACTROBAN ) 2 % Apply 1 Application topically 2 (two) times daily. 22 g Enedelia Dorna HERO, FNP      PDMP not reviewed this encounter.   Enedelia Dorna HERO, [place] 12/22/23 1739

## 2023-12-22 NOTE — Discharge Instructions (Addendum)
 Dog bites are at high risk for infection.  Take antibiotic as prescribed for 7 days to treat/prevent infection to the bite wound.  Apply mupirocin  ointment to the wound every 12 hours for the next 7 days.   Cleanse the wound with warm soap and water every 12 hours, then cover with non-stick gauze and coban wrap for the next 7-10 days while healing.  Watch for worsening signs of infection such as fever, redness, swelling, warmth, or pain to the site.   If you develop any new or worsening symptoms or if your symptoms do not start to improve, please return here or follow-up with your primary care provider. If your symptoms are severe, please go to the emergency room.

## 2023-12-22 NOTE — ED Triage Notes (Signed)
 Pt states her dog scratched her to her Rt upper arm causing a skin tear. States called her PCP and thinks her tetanus is out of date and may need antibiotics. Bandage applied.

## 2024-03-24 ENCOUNTER — Ambulatory Visit: Admitting: Podiatry

## 2024-03-24 ENCOUNTER — Encounter: Payer: Self-pay | Admitting: Podiatry

## 2024-03-24 DIAGNOSIS — M79675 Pain in left toe(s): Secondary | ICD-10-CM | POA: Diagnosis not present

## 2024-03-24 DIAGNOSIS — M79674 Pain in right toe(s): Secondary | ICD-10-CM | POA: Diagnosis not present

## 2024-03-24 DIAGNOSIS — B351 Tinea unguium: Secondary | ICD-10-CM | POA: Diagnosis not present

## 2024-03-28 NOTE — Progress Notes (Signed)
  Subjective:  Patient ID: Leslie Dean, female    DOB: 12/30/1945,  MRN: [id number]  78 y.o. female presents painful mycotic toenails x 10 which interfere with daily activities. Pain is relieved with periodic professional debridement. Chief Complaint  Patient presents with   RFC    RFC Non diabetic toenail trim. LOV with PCP 02/03/24.   New problem(s): None   PCP is Roy Harlene HERO, NP. Allergies  Allergen Reactions   Albuterol Other (See Comments)   Albuterol Sulfate    Hydromorphone  Hcl    Other     Says narcotics cause nausea for her    Review of Systems: Negative except as noted in the HPI.   Objective:  Leslie Dean is a pleasant 78 y.o. female in NAD. AAO x 3.  Vascular Examination: Vascular status intact b/l with palpable pedal pulses. CFT immediate b/l. Pedal hair present. No edema. No pain with calf compression b/l. Skin temperature gradient WNL b/l. No varicosities noted. No cyanosis or clubbing noted.  Neurological Examination: Sensation grossly intact b/l with 10 gram monofilament. Vibratory sensation intact b/l.  Dermatological Examination: Pedal skin with normal turgor, texture and tone b/l. No open wounds nor interdigital macerations noted. Toenails 1-5 b/l thick, discolored, elongated with subungual debris and pain on dorsal palpation. No hyperkeratotic lesions noted b/l.   Musculoskeletal Examination: Muscle strength 5/5 to b/l LE.  No pain, crepitus noted b/l. No gross pedal deformities. Patient ambulates independently without assistive aids.   Radiographs: None Assessment:   1. Pain due to onychomycosis of toenails of both feet    Plan:  Patient was evaluated and treated. All patient's and/or POA's questions/concerns addressed on today's visit. Toenails 1-5 b/l debrided in length and girth without incident. Continue soft, supportive shoe gear daily. Report any pedal injuries to medical professional. Call office if there are any  questions/concerns. -Patient/POA to call should there be question/concern in the interim.  Return in about 3 months (around 06/24/2024).  Delon LITTIE Merlin, DPM      [facility] LOCATION: [address] [place] [address]                   Office [phone]   [facility] LOCATION: [address] [place], [place] [address] Office [phone]

## 2024-07-07 ENCOUNTER — Ambulatory Visit: Admitting: Podiatry
